# Patient Record
Sex: Female | Born: 1985 | Race: White | Hispanic: No | Marital: Married | Smoking: Current every day smoker
Health system: Southern US, Community
[De-identification: ages and names within clinical notes are randomized; demographics above are authoritative.]

## PROBLEM LIST (undated history)

## (undated) DIAGNOSIS — R109 Unspecified abdominal pain: Secondary | ICD-10-CM

## (undated) DIAGNOSIS — F32A Depression, unspecified: Secondary | ICD-10-CM

## (undated) DIAGNOSIS — F329 Major depressive disorder, single episode, unspecified: Secondary | ICD-10-CM

## (undated) DIAGNOSIS — N200 Calculus of kidney: Secondary | ICD-10-CM

## (undated) DIAGNOSIS — R569 Unspecified convulsions: Secondary | ICD-10-CM

## (undated) DIAGNOSIS — G8929 Other chronic pain: Secondary | ICD-10-CM

## (undated) DIAGNOSIS — D649 Anemia, unspecified: Secondary | ICD-10-CM

## (undated) DIAGNOSIS — F319 Bipolar disorder, unspecified: Secondary | ICD-10-CM

## (undated) DIAGNOSIS — F419 Anxiety disorder, unspecified: Secondary | ICD-10-CM

## (undated) DIAGNOSIS — F4310 Post-traumatic stress disorder, unspecified: Secondary | ICD-10-CM

## (undated) HISTORY — PX: EYE SURGERY: SHX253

## (undated) HISTORY — PX: CHOLECYSTECTOMY: SHX55

---
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
## 2008-12-03 ENCOUNTER — Ambulatory Visit: Payer: Self-pay | Admitting: Internal Medicine

## 2008-12-24 ENCOUNTER — Encounter: Payer: Self-pay | Admitting: Family Medicine

## 2008-12-24 ENCOUNTER — Ambulatory Visit: Payer: Self-pay | Admitting: Internal Medicine

## 2008-12-27 ENCOUNTER — Emergency Department (HOSPITAL_COMMUNITY): Admission: EM | Admit: 2008-12-27 | Discharge: 2008-12-27 | Payer: Self-pay | Admitting: Emergency Medicine

## 2009-01-06 ENCOUNTER — Emergency Department (HOSPITAL_COMMUNITY): Admission: EM | Admit: 2009-01-06 | Discharge: 2009-01-06 | Payer: Self-pay | Admitting: Emergency Medicine

## 2009-09-20 ENCOUNTER — Ambulatory Visit: Payer: Self-pay | Admitting: Internal Medicine

## 2009-09-29 ENCOUNTER — Emergency Department (HOSPITAL_COMMUNITY): Admission: EM | Admit: 2009-09-29 | Discharge: 2009-09-30 | Payer: Self-pay | Admitting: Emergency Medicine

## 2009-09-29 ENCOUNTER — Ambulatory Visit: Payer: Self-pay | Admitting: Internal Medicine

## 2009-10-14 ENCOUNTER — Ambulatory Visit (HOSPITAL_COMMUNITY): Admission: RE | Admit: 2009-10-14 | Discharge: 2009-10-14 | Payer: Self-pay | Admitting: Obstetrics and Gynecology

## 2009-10-21 ENCOUNTER — Emergency Department (HOSPITAL_COMMUNITY): Admission: EM | Admit: 2009-10-21 | Discharge: 2009-10-21 | Payer: Self-pay | Admitting: Emergency Medicine

## 2010-03-17 ENCOUNTER — Emergency Department (HOSPITAL_COMMUNITY): Admission: EM | Admit: 2010-03-17 | Discharge: 2010-03-18 | Payer: Self-pay | Admitting: Emergency Medicine

## 2010-03-19 ENCOUNTER — Emergency Department (HOSPITAL_COMMUNITY): Admission: EM | Admit: 2010-03-19 | Discharge: 2010-03-20 | Payer: Self-pay | Admitting: Emergency Medicine

## 2010-05-13 ENCOUNTER — Emergency Department (HOSPITAL_COMMUNITY): Admission: EM | Admit: 2010-05-13 | Discharge: 2010-05-13 | Payer: Self-pay | Admitting: Emergency Medicine

## 2010-05-15 ENCOUNTER — Emergency Department (HOSPITAL_COMMUNITY): Admission: EM | Admit: 2010-05-15 | Discharge: 2010-05-16 | Payer: Self-pay | Admitting: Emergency Medicine

## 2010-09-04 ENCOUNTER — Emergency Department (HOSPITAL_COMMUNITY)
Admission: EM | Admit: 2010-09-04 | Discharge: 2010-09-04 | Payer: Self-pay | Source: Home / Self Care | Admitting: Emergency Medicine

## 2010-09-16 ENCOUNTER — Emergency Department (HOSPITAL_COMMUNITY)
Admission: EM | Admit: 2010-09-16 | Discharge: 2010-09-16 | Payer: Self-pay | Source: Home / Self Care | Admitting: Emergency Medicine

## 2010-09-21 ENCOUNTER — Emergency Department (HOSPITAL_COMMUNITY)
Admission: EM | Admit: 2010-09-21 | Discharge: 2010-09-22 | Payer: Self-pay | Source: Home / Self Care | Admitting: Emergency Medicine

## 2010-09-25 LAB — DIFFERENTIAL
Basophils Absolute: 0 10*3/uL (ref 0.0–0.1)
Basophils Absolute: 0 10*3/uL (ref 0.0–0.1)
Basophils Relative: 0 % (ref 0–1)
Basophils Relative: 0 % (ref 0–1)
Eosinophils Absolute: 0.4 10*3/uL (ref 0.0–0.7)
Eosinophils Absolute: 0.4 10*3/uL (ref 0.0–0.7)
Eosinophils Relative: 6 % — ABNORMAL HIGH (ref 0–5)
Eosinophils Relative: 6 % — ABNORMAL HIGH (ref 0–5)
Lymphocytes Relative: 24 % (ref 12–46)
Lymphocytes Relative: 28 % (ref 12–46)
Lymphs Abs: 1.5 10*3/uL (ref 0.7–4.0)
Lymphs Abs: 1.7 10*3/uL (ref 0.7–4.0)
Monocytes Absolute: 0.5 10*3/uL (ref 0.1–1.0)
Monocytes Absolute: 0.5 10*3/uL (ref 0.1–1.0)
Monocytes Relative: 8 % (ref 3–12)
Monocytes Relative: 9 % (ref 3–12)
Neutro Abs: 3.9 10*3/uL (ref 1.7–7.7)
Neutro Abs: 3.4 10*3/uL (ref 1.7–7.7)
Neutrophils Relative %: 62 % (ref 43–77)
Neutrophils Relative %: 57 % (ref 43–77)

## 2010-09-25 LAB — URINALYSIS, ROUTINE W REFLEX MICROSCOPIC
Bilirubin Urine: NEGATIVE
Bilirubin Urine: NEGATIVE
Hgb urine dipstick: NEGATIVE
Hgb urine dipstick: NEGATIVE
Ketones, ur: NEGATIVE mg/dL
Ketones, ur: NEGATIVE mg/dL
Nitrite: NEGATIVE
Nitrite: NEGATIVE
Protein, ur: NEGATIVE mg/dL
Protein, ur: NEGATIVE mg/dL
Specific Gravity, Urine: 1.017 (ref 1.005–1.030)
Specific Gravity, Urine: 1.026 (ref 1.005–1.030)
Urine Glucose, Fasting: NEGATIVE mg/dL
Urine Glucose, Fasting: NEGATIVE mg/dL
Urobilinogen, UA: 0.2 mg/dL (ref 0.0–1.0)
Urobilinogen, UA: 0.2 mg/dL (ref 0.0–1.0)
pH: 6 (ref 5.0–8.0)
pH: 7 (ref 5.0–8.0)

## 2010-09-25 LAB — CBC
HCT: 37.2 % (ref 36.0–46.0)
HCT: 36.1 % (ref 36.0–46.0)
Hemoglobin: 12.5 g/dL (ref 12.0–15.0)
Hemoglobin: 12.6 g/dL (ref 12.0–15.0)
MCH: 30.6 pg (ref 26.0–34.0)
MCH: 31.9 pg (ref 26.0–34.0)
MCHC: 33.6 g/dL (ref 30.0–36.0)
MCHC: 34.9 g/dL (ref 30.0–36.0)
MCV: 91.2 fL (ref 78.0–100.0)
MCV: 91.4 fL (ref 78.0–100.0)
Platelets: 183 10*3/uL (ref 150–400)
Platelets: 168 10*3/uL (ref 150–400)
RBC: 4.08 MIL/uL (ref 3.87–5.11)
RBC: 3.95 MIL/uL (ref 3.87–5.11)
RDW: 13 % (ref 11.5–15.5)
RDW: 12.7 % (ref 11.5–15.5)
WBC: 6.3 10*3/uL (ref 4.0–10.5)
WBC: 6 10*3/uL (ref 4.0–10.5)

## 2010-09-25 LAB — BASIC METABOLIC PANEL
BUN: 14 mg/dL (ref 6–23)
CO2: 25 mEq/L (ref 19–32)
Calcium: 9.2 mg/dL (ref 8.4–10.5)
Chloride: 107 mEq/L (ref 96–112)
Creatinine, Ser: 0.68 mg/dL (ref 0.4–1.2)
GFR calc Af Amer: >60 mL/min (ref >60)
GFR calc non Af Amer: >60 mL/min (ref >60)
Glucose, Bld: 90 mg/dL (ref 70–99)
Potassium: 3.8 mEq/L (ref 3.5–5.1)
Sodium: 138 mEq/L (ref 135–145)

## 2010-09-25 LAB — GC/CHLAMYDIA PROBE AMP, GENITAL
Chlamydia, DNA Probe: NEGATIVE
GC Probe Amp, Genital: NEGATIVE

## 2010-09-25 LAB — COMPREHENSIVE METABOLIC PANEL
ALT: 21 U/L (ref 0–35)
AST: 26 U/L (ref 0–37)
Albumin: 3.9 g/dL (ref 3.5–5.2)
Alkaline Phosphatase: 59 U/L (ref 39–117)
BUN: 14 mg/dL (ref 6–23)
CO2: 26 mEq/L (ref 19–32)
Calcium: 9.1 mg/dL (ref 8.4–10.5)
Chloride: 105 mEq/L (ref 96–112)
Creatinine, Ser: 0.65 mg/dL (ref 0.4–1.2)
GFR calc Af Amer: >60 mL/min (ref >60)
GFR calc non Af Amer: >60 mL/min (ref >60)
Glucose, Bld: 72 mg/dL (ref 70–99)
Potassium: 3.7 mEq/L (ref 3.5–5.1)
Sodium: 136 mEq/L (ref 135–145)
Total Bilirubin: 0.5 mg/dL (ref 0.3–1.2)
Total Protein: 6.8 g/dL (ref 6.0–8.3)

## 2010-09-25 LAB — WET PREP, GENITAL
Trich, Wet Prep: NONE SEEN
WBC, Wet Prep HPF POC: NONE SEEN
Yeast Wet Prep HPF POC: NONE SEEN

## 2010-09-25 LAB — POCT PREGNANCY, URINE
Preg Test, Ur: NEGATIVE
Preg Test, Ur: NEGATIVE

## 2010-09-25 LAB — RAPID URINE DRUG SCREEN, HOSP PERFORMED
Amphetamines: NOT DETECTED
Barbiturates: NOT DETECTED
Benzodiazepines: NOT DETECTED
Cocaine: NOT DETECTED
Opiates: NOT DETECTED
Tetrahydrocannabinol: NOT DETECTED

## 2010-09-29 ENCOUNTER — Emergency Department (HOSPITAL_COMMUNITY)
Admission: EM | Admit: 2010-09-29 | Discharge: 2010-09-30 | Payer: Self-pay | Source: Home / Self Care | Admitting: Emergency Medicine

## 2010-09-30 ENCOUNTER — Emergency Department (HOSPITAL_COMMUNITY)
Admission: EM | Admit: 2010-09-30 | Discharge: 2010-10-01 | Payer: Self-pay | Source: Home / Self Care | Admitting: Emergency Medicine

## 2010-10-01 ENCOUNTER — Emergency Department (HOSPITAL_COMMUNITY)
Admission: EM | Admit: 2010-10-01 | Discharge: 2010-10-01 | Payer: Self-pay | Source: Home / Self Care | Admitting: Emergency Medicine

## 2010-10-02 LAB — COMPREHENSIVE METABOLIC PANEL
ALT: 16 U/L (ref 0–35)
AST: 25 U/L (ref 0–37)
Albumin: 4.1 g/dL (ref 3.5–5.2)
Alkaline Phosphatase: 61 U/L (ref 39–117)
BUN: 8 mg/dL (ref 6–23)
CO2: 28 mEq/L (ref 19–32)
Calcium: 9.7 mg/dL (ref 8.4–10.5)
Chloride: 105 mEq/L (ref 96–112)
Creatinine, Ser: 0.78 mg/dL (ref 0.4–1.2)
GFR calc Af Amer: >60 mL/min (ref >60)
GFR calc non Af Amer: >60 mL/min (ref >60)
Glucose, Bld: 90 mg/dL (ref 70–99)
Potassium: 3.9 mEq/L (ref 3.5–5.1)
Sodium: 140 mEq/L (ref 135–145)
Total Bilirubin: 0.5 mg/dL (ref 0.3–1.2)
Total Protein: 7.2 g/dL (ref 6.0–8.3)

## 2010-10-02 LAB — LIPASE, BLOOD: Lipase: 19 U/L (ref 11–59)

## 2010-10-02 LAB — URINALYSIS, ROUTINE W REFLEX MICROSCOPIC
Bilirubin Urine: NEGATIVE
Hgb urine dipstick: NEGATIVE
Ketones, ur: NEGATIVE mg/dL
Nitrite: NEGATIVE
Protein, ur: NEGATIVE mg/dL
Specific Gravity, Urine: 1.018 (ref 1.005–1.030)
Urine Glucose, Fasting: NEGATIVE mg/dL
Urobilinogen, UA: 0.2 mg/dL (ref 0.0–1.0)
pH: 6.5 (ref 5.0–8.0)

## 2010-10-02 LAB — CBC
HCT: 36.8 % (ref 36.0–46.0)
Hemoglobin: 12.6 g/dL (ref 12.0–15.0)
MCH: 31.2 pg (ref 26.0–34.0)
MCHC: 34.2 g/dL (ref 30.0–36.0)
MCV: 91.1 fL (ref 78.0–100.0)
Platelets: 171 10*3/uL (ref 150–400)
RBC: 4.04 MIL/uL (ref 3.87–5.11)
RDW: 12.7 % (ref 11.5–15.5)
WBC: 5.1 10*3/uL (ref 4.0–10.5)

## 2010-10-02 LAB — DIFFERENTIAL
Basophils Absolute: 0 10*3/uL (ref 0.0–0.1)
Basophils Relative: 0 % (ref 0–1)
Eosinophils Absolute: 0.4 10*3/uL (ref 0.0–0.7)
Eosinophils Relative: 8 % — ABNORMAL HIGH (ref 0–5)
Lymphocytes Relative: 34 % (ref 12–46)
Lymphs Abs: 1.7 10*3/uL (ref 0.7–4.0)
Monocytes Absolute: 0.5 10*3/uL (ref 0.1–1.0)
Monocytes Relative: 10 % (ref 3–12)
Neutro Abs: 2.5 10*3/uL (ref 1.7–7.7)
Neutrophils Relative %: 48 % (ref 43–77)

## 2010-10-02 LAB — POCT PREGNANCY, URINE: Preg Test, Ur: NEGATIVE

## 2010-10-03 LAB — CBC
MCV: 92.1 fL (ref 78.0–100.0)
Platelets: 163 10*3/uL (ref 150–400)
RBC: 3.93 MIL/uL (ref 3.87–5.11)
WBC: 5.1 10*3/uL (ref 4.0–10.5)

## 2010-10-03 LAB — COMPREHENSIVE METABOLIC PANEL
ALT: 17 U/L (ref 0–35)
AST: 25 U/L (ref 0–37)
Albumin: 3.6 g/dL (ref 3.5–5.2)
Alkaline Phosphatase: 58 U/L (ref 39–117)
BUN: 10 mg/dL (ref 6–23)
Chloride: 105 mEq/L (ref 96–112)
Potassium: 3.7 mEq/L (ref 3.5–5.1)
Sodium: 140 mEq/L (ref 135–145)
Total Bilirubin: 0.4 mg/dL (ref 0.3–1.2)

## 2010-10-03 LAB — URINALYSIS, ROUTINE W REFLEX MICROSCOPIC
Hgb urine dipstick: NEGATIVE
Ketones, ur: NEGATIVE mg/dL
Leukocytes, UA: NEGATIVE
Protein, ur: NEGATIVE mg/dL
Protein, ur: NEGATIVE mg/dL
Urine Glucose, Fasting: NEGATIVE mg/dL
Urobilinogen, UA: 0.2 mg/dL (ref 0.0–1.0)

## 2010-10-03 LAB — WET PREP, GENITAL: WBC, Wet Prep HPF POC: NONE SEEN

## 2010-10-03 LAB — DIFFERENTIAL
Eosinophils Absolute: 0.3 10*3/uL (ref 0.0–0.7)
Lymphocytes Relative: 25 % (ref 12–46)
Lymphs Abs: 1.3 10*3/uL (ref 0.7–4.0)
Neutrophils Relative %: 57 % (ref 43–77)

## 2010-10-03 LAB — POCT PREGNANCY, URINE: Preg Test, Ur: NEGATIVE

## 2010-10-03 LAB — RAPID URINE DRUG SCREEN, HOSP PERFORMED
Cocaine: NOT DETECTED
Tetrahydrocannabinol: NOT DETECTED

## 2010-10-03 LAB — GC/CHLAMYDIA PROBE AMP, GENITAL: GC Probe Amp, Genital: NEGATIVE

## 2010-10-03 LAB — URINE MICROSCOPIC-ADD ON

## 2010-11-20 LAB — COMPREHENSIVE METABOLIC PANEL
ALT: 21 U/L (ref 0–35)
Albumin: 3.8 g/dL (ref 3.5–5.2)
Alkaline Phosphatase: 62 U/L (ref 39–117)
BUN: 12 mg/dL (ref 6–23)
Chloride: 105 mEq/L (ref 96–112)
Glucose, Bld: 78 mg/dL (ref 70–99)
Potassium: 4.5 mEq/L (ref 3.5–5.1)
Sodium: 141 mEq/L (ref 135–145)
Total Bilirubin: 0.3 mg/dL (ref 0.3–1.2)

## 2010-11-20 LAB — CBC
HCT: 38 % (ref 36.0–46.0)
Hemoglobin: 12.9 g/dL (ref 12.0–15.0)
MCV: 92.2 fL (ref 78.0–100.0)
RBC: 4.12 MIL/uL (ref 3.87–5.11)
WBC: 6.9 10*3/uL (ref 4.0–10.5)

## 2010-11-20 LAB — URINALYSIS, ROUTINE W REFLEX MICROSCOPIC
Glucose, UA: NEGATIVE mg/dL
Hgb urine dipstick: NEGATIVE
Specific Gravity, Urine: 1.01 (ref 1.005–1.030)
pH: 6 (ref 5.0–8.0)

## 2010-11-20 LAB — LIPASE, BLOOD: Lipase: 27 U/L (ref 11–59)

## 2010-11-20 LAB — POCT PREGNANCY, URINE: Preg Test, Ur: NEGATIVE

## 2010-11-23 LAB — GC/CHLAMYDIA PROBE AMP, GENITAL
Chlamydia, DNA Probe: NEGATIVE
GC Probe Amp, Genital: NEGATIVE

## 2010-11-23 LAB — COMPREHENSIVE METABOLIC PANEL
ALT: 23 U/L (ref 0–35)
AST: 24 U/L (ref 0–37)
Alkaline Phosphatase: 57 U/L (ref 39–117)
Alkaline Phosphatase: 52 U/L (ref 39–117)
BUN: 11 mg/dL (ref 6–23)
CO2: 29 mEq/L (ref 19–32)
CO2: 29 mEq/L (ref 19–32)
Calcium: 9.2 mg/dL (ref 8.4–10.5)
Chloride: 108 mEq/L (ref 96–112)
Creatinine, Ser: 0.76 mg/dL (ref 0.4–1.2)
GFR calc Af Amer: >60 mL/min (ref >60)
GFR calc non Af Amer: >60 mL/min (ref >60)
GFR calc non Af Amer: >60 mL/min (ref >60)
Glucose, Bld: 83 mg/dL (ref 70–99)
Potassium: 3.7 mEq/L (ref 3.5–5.1)
Potassium: 3.8 mEq/L (ref 3.5–5.1)
Sodium: 140 mEq/L (ref 135–145)
Total Bilirubin: 0.5 mg/dL (ref 0.3–1.2)
Total Protein: 6.6 g/dL (ref 6.0–8.3)

## 2010-11-23 LAB — DIFFERENTIAL
Basophils Absolute: 0 K/uL (ref 0.0–0.1)
Basophils Absolute: 0 K/uL (ref 0.0–0.1)
Basophils Relative: 1 % (ref 0–1)
Basophils Relative: 1 % (ref 0–1)
Eosinophils Absolute: 0.2 10*3/uL (ref 0.0–0.7)
Eosinophils Absolute: 0.3 10*3/uL (ref 0.0–0.7)
Eosinophils Relative: 5 % (ref 0–5)
Eosinophils Relative: 8 % — ABNORMAL HIGH (ref 0–5)
Lymphocytes Relative: 32 % (ref 12–46)
Lymphocytes Relative: 33 % (ref 12–46)
Lymphs Abs: 1.6 10*3/uL (ref 0.7–4.0)
Lymphs Abs: 1.3 10*3/uL (ref 0.7–4.0)
Monocytes Absolute: 0.5 10*3/uL (ref 0.1–1.0)
Monocytes Absolute: 0.3 K/uL (ref 0.1–1.0)
Monocytes Relative: 9 % (ref 3–12)
Monocytes Relative: 8 % (ref 3–12)
Neutro Abs: 2.7 K/uL (ref 1.7–7.7)
Neutro Abs: 2 K/uL (ref 1.7–7.7)
Neutrophils Relative %: 54 % (ref 43–77)
Neutrophils Relative %: 50 % (ref 43–77)

## 2010-11-23 LAB — URINALYSIS, ROUTINE W REFLEX MICROSCOPIC
Bilirubin Urine: NEGATIVE
Bilirubin Urine: NEGATIVE
Glucose, UA: NEGATIVE mg/dL
Glucose, UA: NEGATIVE mg/dL
Hgb urine dipstick: NEGATIVE
Hgb urine dipstick: NEGATIVE
Ketones, ur: NEGATIVE mg/dL
Ketones, ur: NEGATIVE mg/dL
Nitrite: NEGATIVE
Nitrite: NEGATIVE
Protein, ur: NEGATIVE mg/dL
Protein, ur: NEGATIVE mg/dL
Specific Gravity, Urine: 1.023 (ref 1.005–1.030)
Specific Gravity, Urine: 1.015 (ref 1.005–1.030)
Urobilinogen, UA: 0.2 mg/dL (ref 0.0–1.0)
Urobilinogen, UA: 0.2 mg/dL (ref 0.0–1.0)
pH: 6.5 (ref 5.0–8.0)
pH: 6 (ref 5.0–8.0)

## 2010-11-23 LAB — CBC
HCT: 33.7 % — ABNORMAL LOW (ref 36.0–46.0)
HCT: 32.4 % — ABNORMAL LOW (ref 36.0–46.0)
Hemoglobin: 11.5 g/dL — ABNORMAL LOW (ref 12.0–15.0)
Hemoglobin: 11.4 g/dL — ABNORMAL LOW (ref 12.0–15.0)
MCH: 31.1 pg (ref 26.0–34.0)
MCH: 32.5 pg (ref 26.0–34.0)
MCHC: 34.1 g/dL (ref 30.0–36.0)
MCHC: 35.3 g/dL (ref 30.0–36.0)
MCV: 91.1 fL (ref 78.0–100.0)
MCV: 92.1 fL (ref 78.0–100.0)
Platelets: 171 10*3/uL (ref 150–400)
Platelets: 154 K/uL (ref 150–400)
RBC: 3.7 MIL/uL — ABNORMAL LOW (ref 3.87–5.11)
RBC: 3.52 MIL/uL — ABNORMAL LOW (ref 3.87–5.11)
RDW: 12.8 % (ref 11.5–15.5)
RDW: 13.4 % (ref 11.5–15.5)
WBC: 5 10*3/uL (ref 4.0–10.5)
WBC: 4 10*3/uL (ref 4.0–10.5)

## 2010-11-23 LAB — COMPREHENSIVE METABOLIC PANEL WITH GFR
ALT: 22 U/L (ref 0–35)
AST: 27 U/L (ref 0–37)
Albumin: 4 g/dL (ref 3.5–5.2)
Albumin: 3.9 g/dL (ref 3.5–5.2)
BUN: 13 mg/dL (ref 6–23)
Calcium: 9.6 mg/dL (ref 8.4–10.5)
Chloride: 107 meq/L (ref 96–112)
Creatinine, Ser: 0.63 mg/dL (ref 0.4–1.2)
GFR calc Af Amer: >60 mL/min (ref >60)
Glucose, Bld: 90 mg/dL (ref 70–99)
Sodium: 140 meq/L (ref 135–145)
Total Bilirubin: 0.5 mg/dL (ref 0.3–1.2)
Total Protein: 6.9 g/dL (ref 6.0–8.3)

## 2010-11-23 LAB — WET PREP, GENITAL
Clue Cells Wet Prep HPF POC: NONE SEEN
Trich, Wet Prep: NONE SEEN
WBC, Wet Prep HPF POC: NONE SEEN
Yeast Wet Prep HPF POC: NONE SEEN

## 2010-11-23 LAB — POCT PREGNANCY, URINE
Preg Test, Ur: NEGATIVE
Preg Test, Ur: NEGATIVE

## 2010-11-23 LAB — LIPASE, BLOOD
Lipase: 23 U/L (ref 11–59)
Lipase: 31 U/L (ref 11–59)

## 2010-11-26 LAB — COMPREHENSIVE METABOLIC PANEL
ALT: 34 U/L (ref 0–35)
AST: 29 U/L (ref 0–37)
Alkaline Phosphatase: 51 U/L (ref 39–117)
CO2: 27 mEq/L (ref 19–32)
Chloride: 107 mEq/L (ref 96–112)
GFR calc Af Amer: >60 mL/min (ref >60)
GFR calc non Af Amer: >60 mL/min (ref >60)
Potassium: 3.8 mEq/L (ref 3.5–5.1)
Sodium: 138 mEq/L (ref 135–145)
Total Bilirubin: 0.5 mg/dL (ref 0.3–1.2)

## 2010-11-26 LAB — POCT I-STAT, CHEM 8
Creatinine, Ser: 0.7 mg/dL (ref 0.4–1.2)
HCT: 34 % — ABNORMAL LOW (ref 36.0–46.0)
Hemoglobin: 11.6 g/dL — ABNORMAL LOW (ref 12.0–15.0)
Potassium: 4 mEq/L (ref 3.5–5.1)
Sodium: 141 mEq/L (ref 135–145)

## 2010-11-26 LAB — RAPID URINE DRUG SCREEN, HOSP PERFORMED
Amphetamines: NOT DETECTED
Tetrahydrocannabinol: NOT DETECTED

## 2010-11-26 LAB — URINALYSIS, ROUTINE W REFLEX MICROSCOPIC
Bilirubin Urine: NEGATIVE
Glucose, UA: NEGATIVE mg/dL
Hgb urine dipstick: NEGATIVE
Ketones, ur: NEGATIVE mg/dL
Nitrite: NEGATIVE
Urobilinogen, UA: 0.2 mg/dL (ref 0.0–1.0)
pH: 6 (ref 5.0–8.0)

## 2010-11-26 LAB — URINE CULTURE
Colony Count: NO GROWTH
Culture: NO GROWTH

## 2010-11-26 LAB — LIPASE, BLOOD: Lipase: 26 U/L (ref 11–59)

## 2010-11-26 LAB — CBC
Hemoglobin: 11.2 g/dL — ABNORMAL LOW (ref 12.0–15.0)
Hemoglobin: 11.3 g/dL — ABNORMAL LOW (ref 12.0–15.0)
MCH: 32.3 pg (ref 26.0–34.0)
MCHC: 33.9 g/dL (ref 30.0–36.0)
RBC: 3.46 MIL/uL — ABNORMAL LOW (ref 3.87–5.11)
RBC: 3.53 MIL/uL — ABNORMAL LOW (ref 3.87–5.11)
WBC: 5 10*3/uL (ref 4.0–10.5)

## 2010-11-26 LAB — DIFFERENTIAL
Basophils Absolute: 0 10*3/uL (ref 0.0–0.1)
Basophils Relative: 1 % (ref 0–1)
Eosinophils Absolute: 0.3 10*3/uL (ref 0.0–0.7)
Eosinophils Relative: 6 % — ABNORMAL HIGH (ref 0–5)
Lymphs Abs: 2 10*3/uL (ref 0.7–4.0)
Monocytes Relative: 7 % (ref 3–12)
Neutro Abs: 2.4 10*3/uL (ref 1.7–7.7)
Neutrophils Relative %: 48 % (ref 43–77)

## 2010-11-29 LAB — URINE CULTURE
Colony Count: NO GROWTH
Culture: NO GROWTH

## 2010-11-29 LAB — DIFFERENTIAL
Lymphocytes Relative: 32 % (ref 12–46)
Lymphs Abs: 1.8 10*3/uL (ref 0.7–4.0)
Neutrophils Relative %: 55 % (ref 43–77)

## 2010-11-29 LAB — URINALYSIS, ROUTINE W REFLEX MICROSCOPIC
Ketones, ur: NEGATIVE mg/dL
Leukocytes, UA: NEGATIVE
Nitrite: NEGATIVE
Protein, ur: NEGATIVE mg/dL

## 2010-11-29 LAB — RAPID URINE DRUG SCREEN, HOSP PERFORMED
Barbiturates: NOT DETECTED
Opiates: POSITIVE — AB
Tetrahydrocannabinol: NOT DETECTED

## 2010-11-29 LAB — CBC
HCT: 36.6 % (ref 36.0–46.0)
MCV: 92.5 fL (ref 78.0–100.0)
RBC: 3.96 MIL/uL (ref 3.87–5.11)
WBC: 5.6 10*3/uL (ref 4.0–10.5)

## 2010-11-29 LAB — POCT PREGNANCY, URINE: Preg Test, Ur: NEGATIVE

## 2010-11-29 LAB — WET PREP, GENITAL: Trich, Wet Prep: NONE SEEN

## 2010-11-29 LAB — POCT I-STAT, CHEM 8
BUN: 18 mg/dL (ref 6–23)
Calcium, Ion: 1.25 mmol/L (ref 1.12–1.32)
Chloride: 106 mEq/L (ref 96–112)
Glucose, Bld: 83 mg/dL (ref 70–99)

## 2010-11-29 LAB — GC/CHLAMYDIA PROBE AMP, GENITAL
Chlamydia, DNA Probe: NEGATIVE
GC Probe Amp, Genital: NEGATIVE

## 2010-12-20 LAB — COMPREHENSIVE METABOLIC PANEL
ALT: 16 U/L (ref 0–35)
Albumin: 3.5 g/dL (ref 3.5–5.2)
Alkaline Phosphatase: 65 U/L (ref 39–117)
BUN: 13 mg/dL (ref 6–23)
Chloride: 107 mEq/L (ref 96–112)
Potassium: 4.1 mEq/L (ref 3.5–5.1)
Sodium: 140 mEq/L (ref 135–145)
Total Bilirubin: 0.4 mg/dL (ref 0.3–1.2)
Total Protein: 6.5 g/dL (ref 6.0–8.3)

## 2010-12-20 LAB — URINALYSIS, ROUTINE W REFLEX MICROSCOPIC
Bilirubin Urine: NEGATIVE
Glucose, UA: NEGATIVE mg/dL
Glucose, UA: NEGATIVE mg/dL
Ketones, ur: NEGATIVE mg/dL
Ketones, ur: NEGATIVE mg/dL
Leukocytes, UA: NEGATIVE
Nitrite: NEGATIVE
Specific Gravity, Urine: 1.021 (ref 1.005–1.030)
Specific Gravity, Urine: 1.022 (ref 1.005–1.030)
pH: 7 (ref 5.0–8.0)
pH: 7 (ref 5.0–8.0)

## 2010-12-20 LAB — POCT I-STAT, CHEM 8
BUN: 17 mg/dL (ref 6–23)
Calcium, Ion: 1.23 mmol/L (ref 1.12–1.32)
Chloride: 105 mEq/L (ref 96–112)
Creatinine, Ser: 0.9 mg/dL (ref 0.4–1.2)
Glucose, Bld: 82 mg/dL (ref 70–99)
Potassium: 3.9 mEq/L (ref 3.5–5.1)

## 2010-12-20 LAB — WET PREP, GENITAL: Yeast Wet Prep HPF POC: NONE SEEN

## 2010-12-20 LAB — CBC
HCT: 36.4 % (ref 36.0–46.0)
Hemoglobin: 12.8 g/dL (ref 12.0–15.0)
Hemoglobin: 13.3 g/dL (ref 12.0–15.0)
MCHC: 34.7 g/dL (ref 30.0–36.0)
Platelets: 142 10*3/uL — ABNORMAL LOW (ref 150–400)
RBC: 4.17 MIL/uL (ref 3.87–5.11)
RDW: 13.4 % (ref 11.5–15.5)
WBC: 6.3 10*3/uL (ref 4.0–10.5)
WBC: 6.7 10*3/uL (ref 4.0–10.5)

## 2010-12-20 LAB — DIFFERENTIAL
Basophils Absolute: 0 10*3/uL (ref 0.0–0.1)
Basophils Relative: 0 % (ref 0–1)
Eosinophils Absolute: 0.2 10*3/uL (ref 0.0–0.7)
Eosinophils Relative: 3 % (ref 0–5)
Lymphocytes Relative: 34 % (ref 12–46)
Lymphs Abs: 2.1 10*3/uL (ref 0.7–4.0)
Monocytes Absolute: 0.4 10*3/uL (ref 0.1–1.0)
Monocytes Absolute: 0.4 10*3/uL (ref 0.1–1.0)
Monocytes Relative: 5 % (ref 3–12)
Monocytes Relative: 7 % (ref 3–12)
Neutro Abs: 3.4 10*3/uL (ref 1.7–7.7)

## 2010-12-20 LAB — GC/CHLAMYDIA PROBE AMP, GENITAL: GC Probe Amp, Genital: NEGATIVE

## 2010-12-20 LAB — RPR: RPR Ser Ql: NONREACTIVE

## 2010-12-20 LAB — URINE MICROSCOPIC-ADD ON

## 2010-12-20 LAB — POCT PREGNANCY, URINE: Preg Test, Ur: NEGATIVE

## 2012-10-05 ENCOUNTER — Emergency Department (HOSPITAL_COMMUNITY): Payer: Medicare Other

## 2012-10-05 ENCOUNTER — Emergency Department (HOSPITAL_COMMUNITY)
Admission: EM | Admit: 2012-10-05 | Discharge: 2012-10-05 | Disposition: A | Discharge status: Home / Self Care | Payer: Medicare Other | Attending: Emergency Medicine | Admitting: Emergency Medicine

## 2012-10-05 ENCOUNTER — Encounter (HOSPITAL_COMMUNITY): Payer: Self-pay | Admitting: *Deleted

## 2012-10-05 DIAGNOSIS — F17200 Nicotine dependence, unspecified, uncomplicated: Secondary | ICD-10-CM | POA: Insufficient documentation

## 2012-10-05 DIAGNOSIS — Z3202 Encounter for pregnancy test, result negative: Secondary | ICD-10-CM | POA: Insufficient documentation

## 2012-10-05 DIAGNOSIS — Z862 Personal history of diseases of the blood and blood-forming organs and certain disorders involving the immune mechanism: Secondary | ICD-10-CM | POA: Insufficient documentation

## 2012-10-05 DIAGNOSIS — R109 Unspecified abdominal pain: Secondary | ICD-10-CM

## 2012-10-05 DIAGNOSIS — Z87442 Personal history of urinary calculi: Secondary | ICD-10-CM | POA: Insufficient documentation

## 2012-10-05 DIAGNOSIS — D649 Anemia, unspecified: Secondary | ICD-10-CM | POA: Insufficient documentation

## 2012-10-05 DIAGNOSIS — R1013 Epigastric pain: Secondary | ICD-10-CM | POA: Insufficient documentation

## 2012-10-05 DIAGNOSIS — N200 Calculus of kidney: Secondary | ICD-10-CM | POA: Insufficient documentation

## 2012-10-05 DIAGNOSIS — R112 Nausea with vomiting, unspecified: Secondary | ICD-10-CM | POA: Insufficient documentation

## 2012-10-05 HISTORY — DX: Calculus of kidney: N20.0

## 2012-10-05 HISTORY — DX: Anemia, unspecified: D64.9

## 2012-10-05 LAB — URINALYSIS, ROUTINE W REFLEX MICROSCOPIC
Bilirubin Urine: NEGATIVE
Glucose, UA: NEGATIVE mg/dL
Hgb urine dipstick: NEGATIVE
Ketones, ur: NEGATIVE mg/dL
Nitrite: NEGATIVE
Specific Gravity, Urine: 1.008 (ref 1.005–1.030)
pH: 6.5 (ref 5.0–8.0)

## 2012-10-05 LAB — CBC WITH DIFFERENTIAL/PLATELET
Basophils Absolute: 0 10*3/uL (ref 0.0–0.1)
Eosinophils Absolute: 0.4 10*3/uL (ref 0.0–0.7)
Eosinophils Relative: 7 % — ABNORMAL HIGH (ref 0–5)
Lymphocytes Relative: 31 % (ref 12–46)
MCH: 31 pg (ref 26.0–34.0)
MCV: 92.8 fL (ref 78.0–100.0)
Neutrophils Relative %: 53 % (ref 43–77)
Platelets: 167 10*3/uL (ref 150–400)
RDW: 12.9 % (ref 11.5–15.5)
WBC: 6 10*3/uL (ref 4.0–10.5)

## 2012-10-05 LAB — LIPASE, BLOOD: Lipase: 36 U/L (ref 11–59)

## 2012-10-05 LAB — COMPREHENSIVE METABOLIC PANEL
Albumin: 3.7 g/dL (ref 3.5–5.2)
Alkaline Phosphatase: 62 U/L (ref 39–117)
BUN: 10 mg/dL (ref 6–23)
CO2: 28 mEq/L (ref 19–32)
Chloride: 104 mEq/L (ref 96–112)
GFR calc non Af Amer: >90 mL/min (ref >90)
Glucose, Bld: 83 mg/dL (ref 70–99)
Potassium: 3.6 mEq/L (ref 3.5–5.1)
Total Bilirubin: 0.1 mg/dL — ABNORMAL LOW (ref 0.3–1.2)

## 2012-10-05 MED ORDER — PROMETHAZINE HCL 25 MG PO TABS: 25.0000 mg | ORAL_TABLET | Freq: Four times a day (QID) | ORAL | Status: DC | PRN

## 2012-10-05 MED ORDER — PROMETHAZINE HCL 12.5 MG PO TABS
12.5000 mg | ORAL_TABLET | Freq: Once | ORAL | Status: DC
  Filled 2012-10-05: qty 1

## 2012-10-05 MED ORDER — MORPHINE SULFATE 4 MG/ML IJ SOLN
4.0000 mg | Freq: Once | INTRAMUSCULAR | Status: AC
  Administered 2012-10-05: 4 mg via INTRAVENOUS
  Filled 2012-10-05: qty 1

## 2012-10-05 MED ORDER — SODIUM CHLORIDE 0.9 % IV BOLUS (SEPSIS)
1000.0000 mL | Freq: Once | INTRAVENOUS | Status: AC
  Administered 2012-10-05: 1000 mL via INTRAVENOUS

## 2012-10-05 MED ORDER — IOHEXOL 300 MG/ML  SOLN
100.0000 mL | Freq: Once | INTRAMUSCULAR | Status: AC | PRN
  Administered 2012-10-05: 100 mL via INTRAVENOUS

## 2012-10-05 MED ORDER — PROMETHAZINE HCL 25 MG/ML IJ SOLN
12.5000 mg | Freq: Once | INTRAMUSCULAR | Status: AC
  Administered 2012-10-05: 12.5 mg via INTRAVENOUS
  Filled 2012-10-05: qty 1

## 2012-10-05 MED ORDER — IOHEXOL 300 MG/ML  SOLN
50.0000 mL | Freq: Once | INTRAMUSCULAR | Status: AC | PRN
  Administered 2012-10-05: 50 mL via ORAL

## 2012-10-05 MED ORDER — PROMETHAZINE HCL 25 MG RE SUPP: 25.0000 mg | Freq: Four times a day (QID) | RECTAL | Status: DC | PRN

## 2012-10-05 NOTE — ED Notes (Signed)
Pt unhooked from monitor for transport to CT.

## 2012-10-05 NOTE — ED Notes (Signed)
Pt sts she wants to leave has appt at 6pm, SN instructs pt on importance of waiting for results and will speak to MD. Dr. Felix Pacini to speak with pt.

## 2012-10-05 NOTE — ED Provider Notes (Signed)
History     CSN: [id number]  Arrival date & time 10/05/12  1212   First MD Initiated Contact with Patient 10/05/12 1251      No chief complaint on file.   (Consider location/radiation/quality/duration/timing/severity/associated sxs/prior treatment) HPI Complains of abdominal pain diffuse onset 5 days ago. Patient has vomited multiple times over the past 5 days, no hematemesis no fever no diarrhea. Last bowel movement 2 days ago, normal she has treated herself with Pepcid, Prilosec, ibuprofen, and Pepto-Bismol without relief no fever. Pain radiates to the back. Pain is intermittent lasting 10 minutes at a time. It was initially constant when it started 5 days ago. Presently feels nauseated the pain is mild at present. Last normal menstrual period 08/15/2013. No vaginal discharge no other associated symptoms. Nothing makes symptoms better or worse Past Medical History  Diagnosis Date  . Kidney stones   . Anemia     Past Surgical History  Procedure Date  . Cholecystectomy     No family history on file.  History  Substance Use Topics  . Smoking status: Current Every Day Smoker  . Smokeless tobacco: Not on file  . Alcohol Use: No    OB History    Grav Para Term Preterm Abortions TAB SAB Ect Mult Living                  Review of Systems  Constitutional: Negative.   HENT: Negative.   Respiratory: Negative.   Cardiovascular: Negative.   Gastrointestinal: Positive for nausea, vomiting and abdominal pain.  Musculoskeletal: Negative.   Skin: Negative.   Neurological: Negative.   Hematological: Negative.   Psychiatric/Behavioral: Negative.   All other systems reviewed and are negative.    Allergies  Reglan; Compazine; Haldol; Latex; Lidocaine; Mushroom extract complex; Orange fruit; Other; Penicillins; Toradol; Tramadol; and Zofran  Home Medications   Current Outpatient Rx  Name  Route  Sig  Dispense  Refill  . DIPHENHYDRAMINE HCL 25 MG PO TABS   Oral   Take 25  mg by mouth at bedtime as needed. For nausea         . OXYCODONE-ACETAMINOPHEN 5-325 MG PO TABS   Oral   Take 1-2 tablets by mouth every 4 (four) hours as needed. For pain         . PROMETHAZINE HCL 25 MG PO TABS   Oral   Take 25 mg by mouth every 6 (six) hours as needed. For nausea           BP 125/79  Pulse 108  Temp 98.4 F (36.9 C) (Oral)  Resp 18  SpO2 100%  LMP 09/15/2012  Physical Exam  Nursing note and vitals reviewed. Constitutional: She appears well-developed and well-nourished.  HENT:  Head: Normocephalic and atraumatic.  Eyes: Conjunctivae normal are normal. Pupils are equal, round, and reactive to light.  Neck: Neck supple. No tracheal deviation present. No thyromegaly present.  Cardiovascular: Regular rhythm.   No murmur heard.      Mildly tachycardic  Pulmonary/Chest: Effort normal and breath sounds normal.  Abdominal: Soft. Bowel sounds are normal. She exhibits no distension and no mass. There is tenderness. There is no rebound and no guarding.       Epigastric tenderness, tenderness is mild  Musculoskeletal: Normal range of motion. She exhibits no edema and no tenderness.  Neurological: She is alert. Coordination normal.  Skin: Skin is warm and dry. No rash noted.  Psychiatric: She has a normal mood and affect.  ED Course  Procedures (including critical care time)   Labs Reviewed  URINALYSIS, ROUTINE W REFLEX MICROSCOPIC   No results found. Results for orders placed during the hospital encounter of 10/05/12  URINALYSIS, ROUTINE W REFLEX MICROSCOPIC      Component Value Range   Color, Urine YELLOW  YELLOW   APPearance CLEAR  CLEAR   Specific Gravity, Urine 1.008  1.005 - 1.030   pH 6.5  5.0 - 8.0   Glucose, UA NEGATIVE  NEGATIVE mg/dL   Hgb urine dipstick NEGATIVE  NEGATIVE   Bilirubin Urine NEGATIVE  NEGATIVE   Ketones, ur NEGATIVE  NEGATIVE mg/dL   Protein, ur NEGATIVE  NEGATIVE mg/dL   Urobilinogen, UA 0.2  0.0 - 1.0 mg/dL    Nitrite NEGATIVE  NEGATIVE   Leukocytes, UA NEGATIVE  NEGATIVE  COMPREHENSIVE METABOLIC PANEL      Component Value Range   Sodium 142  135 - 145 mEq/L   Potassium 3.6  3.5 - 5.1 mEq/L   Chloride 104  96 - 112 mEq/L   CO2 28  19 - 32 mEq/L   Glucose, Bld 83  70 - 99 mg/dL   BUN 10  6 - 23 mg/dL   Creatinine, Ser 1.61  0.50 - 1.10 mg/dL   Calcium 9.6  8.4 - 09.6 mg/dL   Total Protein 6.9  6.0 - 8.3 g/dL   Albumin 3.7  3.5 - 5.2 g/dL   AST 24  0 - 37 U/L   ALT 16  0 - 35 U/L   Alkaline Phosphatase 62  39 - 117 U/L   Total Bilirubin 0.1 (*) 0.3 - 1.2 mg/dL   GFR calc non Af Amer >90  >90 mL/min   GFR calc Af Amer >90  >90 mL/min  CBC WITH DIFFERENTIAL      Component Value Range   WBC 6.0  4.0 - 10.5 K/uL   RBC 4.00  3.87 - 5.11 MIL/uL   Hemoglobin 12.4  12.0 - 15.0 g/dL   HCT 04.5  40.9 - 81.1 %   MCV 92.8  78.0 - 100.0 fL   MCH 31.0  26.0 - 34.0 pg   MCHC 33.4  30.0 - 36.0 g/dL   RDW 91.4  78.2 - 95.6 %   Platelets 167  150 - 400 K/uL   Neutrophils Relative 53  43 - 77 %   Neutro Abs 3.2  1.7 - 7.7 K/uL   Lymphocytes Relative 31  12 - 46 %   Lymphs Abs 1.9  0.7 - 4.0 K/uL   Monocytes Relative 9  3 - 12 %   Monocytes Absolute 0.5  0.1 - 1.0 K/uL   Eosinophils Relative 7 (*) 0 - 5 %   Eosinophils Absolute 0.4  0.0 - 0.7 K/uL   Basophils Relative 1  0 - 1 %   Basophils Absolute 0.0  0.0 - 0.1 K/uL  LIPASE, BLOOD      Component Value Range   Lipase 36  11 - 59 U/L  POCT PREGNANCY, URINE      Component Value Range   Preg Test, Ur NEGATIVE  NEGATIVE   Ct Abdomen Pelvis W Contrast  10/05/2012  *RADIOLOGY REPORT*  Clinical Data: Generalized abdominal pain.  Nausea and vomiting.  1- week history of these symptoms.  Surgical history includes cholecystectomy.  Prior history of urinary tract calculi.  CT ABDOMEN AND PELVIS WITH CONTRAST  Technique:  Multidetector CT imaging of the abdomen and pelvis was performed following  the standard protocol during bolus administration of  intravenous contrast.  Contrast: OMNIPAQUE IOHEXOL 300 MG/ML. Oral contrast was also administered.  Comparison: CT abdomen and pelvis 10/01/2010, 05/16/2010, 12/27/2008.  Findings: Stomach decompressed and unremarkable.  Normal-appearing small bowel.  Very large stool burden throughout the normal appearing colon.  No ascites.  Normal appearing liver, spleen, pancreas, adrenal glands, and kidneys.  Gallbladder surgically absent.  No unexpected biliary ductal dilation.  Common duct can be followed to the ampulla without obstructing stone or mass.  No visible aorto-iliofemoral atherosclerosis.  No significant lymphadenopathy.  Uterus and ovaries unremarkable for age with small bilateral ovarian cysts.  Numerous pelvic phleboliths.  Urinary bladder unremarkable.  Visualized lung bases clear.  Heart size normal.  Bone window images unremarkable.  IMPRESSION:  1.  No acute abnormalities involving the abdomen or pelvis.  Very large stool burden throughout the colon.   Original Report Authenticated By: Hulan Saas, M.D.      No diagnosis found.  2:40 PM pain and nausea improved after treatment with morphine and Phenergan. Requesting more pain medicine states pain is slightly present additional morphine ordered . 455pm feels improved after treatment with intravenous fluids and morphine and Phenergan in the emergency department. Feels ready to go home. No vomiting while here.  Plan MDM  Assessment : Patient has long-standing abdominal pain and vomiting likely functional in etiology Plan prescription Phenergan, gastroenterology referral. Referral resource guide for primary care physician Diagnosis abdominal pain       Doug Sou, MD 10/05/12 1701

## 2012-10-05 NOTE — ED Notes (Signed)
PT reports SX's started 1week ago. Pt reports she can not keep liquids or solids down. Pt also reports ABD pain for 1 week.

## 2012-10-14 ENCOUNTER — Emergency Department (HOSPITAL_COMMUNITY)
Admission: EM | Admit: 2012-10-14 | Discharge: 2012-10-14 | Disposition: A | Discharge status: Home / Self Care | Payer: Medicare Other | Attending: Emergency Medicine | Admitting: Emergency Medicine

## 2012-10-14 ENCOUNTER — Emergency Department (HOSPITAL_COMMUNITY): Payer: Medicare Other

## 2012-10-14 ENCOUNTER — Encounter (HOSPITAL_COMMUNITY): Payer: Self-pay

## 2012-10-14 DIAGNOSIS — M549 Dorsalgia, unspecified: Secondary | ICD-10-CM | POA: Insufficient documentation

## 2012-10-14 DIAGNOSIS — Z87442 Personal history of urinary calculi: Secondary | ICD-10-CM | POA: Insufficient documentation

## 2012-10-14 DIAGNOSIS — Z3202 Encounter for pregnancy test, result negative: Secondary | ICD-10-CM | POA: Insufficient documentation

## 2012-10-14 DIAGNOSIS — R109 Unspecified abdominal pain: Secondary | ICD-10-CM

## 2012-10-14 DIAGNOSIS — F17200 Nicotine dependence, unspecified, uncomplicated: Secondary | ICD-10-CM | POA: Insufficient documentation

## 2012-10-14 DIAGNOSIS — K5900 Constipation, unspecified: Secondary | ICD-10-CM

## 2012-10-14 DIAGNOSIS — Z9089 Acquired absence of other organs: Secondary | ICD-10-CM | POA: Insufficient documentation

## 2012-10-14 DIAGNOSIS — R300 Dysuria: Secondary | ICD-10-CM | POA: Insufficient documentation

## 2012-10-14 DIAGNOSIS — R112 Nausea with vomiting, unspecified: Secondary | ICD-10-CM | POA: Insufficient documentation

## 2012-10-14 DIAGNOSIS — Z862 Personal history of diseases of the blood and blood-forming organs and certain disorders involving the immune mechanism: Secondary | ICD-10-CM | POA: Insufficient documentation

## 2012-10-14 LAB — URINALYSIS, ROUTINE W REFLEX MICROSCOPIC
Bilirubin Urine: NEGATIVE
Hgb urine dipstick: NEGATIVE
Protein, ur: NEGATIVE mg/dL
Urobilinogen, UA: 0.2 mg/dL (ref 0.0–1.0)

## 2012-10-14 LAB — URINE MICROSCOPIC-ADD ON

## 2012-10-14 MED ORDER — POLYETHYLENE GLYCOL 3350 17 G PO PACK: 17.0000 g | PACK | Freq: Every day | ORAL | Status: DC

## 2012-10-14 MED ORDER — PROMETHAZINE HCL 25 MG RE SUPP: 25.0000 mg | Freq: Four times a day (QID) | RECTAL | Status: DC | PRN

## 2012-10-14 MED ORDER — OXYCODONE-ACETAMINOPHEN 5-325 MG PO TABS
1.0000 | ORAL_TABLET | Freq: Once | ORAL | Status: AC
  Administered 2012-10-14: 1 via ORAL
  Filled 2012-10-14: qty 1

## 2012-10-14 NOTE — ED Provider Notes (Signed)
History     CSN: [id number]  Arrival date & time 10/14/12  0865   First MD Initiated Contact with Patient 10/14/12 0848      Chief Complaint  Patient presents with  . Flank Pain    (Consider location/radiation/quality/duration/timing/severity/associated sxs/prior treatment) HPI Comments: 27 y/o F h/o nephrolithiasis p/w right flank pain. Onset 4 days ago. Intermittent. Radiates to groin and RUQ. Associated N/V. No hematemesis. No fevers. Mild dysuria. No hematuria. No vaginal complaints. Normal BM. Pain described as sharp/aching. Has tried home norco, motrin, and phenergan with little relief.   Patient is a 27 y.o. female presenting with flank pain.  Flank Pain This is a recurrent problem. The current episode started in the past 7 days. The problem occurs intermittently. The problem has been gradually worsening. Associated symptoms include abdominal pain (radiating from right flank), nausea and vomiting. Pertinent negatives include no change in bowel habit, chest pain, chills, congestion, coughing, fatigue, fever, headaches, numbness or rash. Exacerbated by: movement.    Past Medical History  Diagnosis Date  . Kidney stones   . Anemia     Past Surgical History  Procedure Date  . Cholecystectomy     History reviewed. No pertinent family history.  History  Substance Use Topics  . Smoking status: Current Every Day Smoker  . Smokeless tobacco: Not on file  . Alcohol Use: No    OB History    Grav Para Term Preterm Abortions TAB SAB Ect Mult Living                  Review of Systems  Constitutional: Negative for fever, chills and fatigue.  HENT: Negative for congestion and rhinorrhea.   Eyes: Negative for pain and visual disturbance.  Respiratory: Negative for cough and shortness of breath.   Cardiovascular: Negative for chest pain and leg swelling.  Gastrointestinal: Positive for nausea, vomiting and abdominal pain (radiating from right flank). Negative for diarrhea  and change in bowel habit.  Genitourinary: Positive for dysuria and flank pain. Negative for hematuria and difficulty urinating.  Musculoskeletal: Positive for back pain (right flank pain. no midline back pain. denies any numbness or tingling.).  Skin: Negative for color change and rash.  Neurological: Negative for dizziness, numbness and headaches.  All other systems reviewed and are negative.    Allergies  Reglan; Compazine; Donnatal; Haldol; Latex; Lidocaine; Mushroom extract complex; Orange fruit; Other; Penicillins; Toradol; Tramadol; and Zofran  Home Medications   Current Outpatient Rx  Name  Route  Sig  Dispense  Refill  . HYDROCODONE-ACETAMINOPHEN 5-325 MG PO TABS   Oral   Take 1 tablet by mouth every 4 (four) hours as needed. For pain         . PROMETHAZINE HCL 25 MG PO TABS   Oral   Take 25 mg by mouth every 6 (six) hours as needed. For nausea         . POLYETHYLENE GLYCOL 3350 PO PACK   Oral   Take 17 g by mouth daily.   14 each   0   . PROMETHAZINE HCL 25 MG RE SUPP   Rectal   Place 1 suppository (25 mg total) rectally every 6 (six) hours as needed for nausea.   12 each   0     BP 102/67  Pulse 95  Temp 97.5 F (36.4 C)  Resp 18  SpO2 100%  LMP 09/24/2012  Physical Exam  Nursing note and vitals reviewed. Constitutional: She is oriented to person,  place, and time. She appears well-developed and well-nourished. No distress.  HENT:  Head: Normocephalic and atraumatic.  Eyes: Conjunctivae normal are normal. Right eye exhibits no discharge. Left eye exhibits no discharge.  Neck: No tracheal deviation present.  Cardiovascular: Normal rate, regular rhythm, normal heart sounds and intact distal pulses.   Pulmonary/Chest: Effort normal and breath sounds normal. No stridor. No respiratory distress. She has no wheezes. She has no rales.  Abdominal: Soft. She exhibits no distension. There is no tenderness. There is no rigidity, no guarding, no CVA  tenderness, no tenderness at McBurney's point and negative Murphy's sign.  Musculoskeletal: She exhibits no edema and no tenderness.       Thoracic back: Normal.       Lumbar back: Normal.  Neurological: She is alert and oriented to person, place, and time.  Skin: Skin is warm and dry.    ED Course  Procedures (including critical care time)  Labs Reviewed  URINALYSIS, ROUTINE W REFLEX MICROSCOPIC - Abnormal; Notable for the following:    Leukocytes, UA TRACE (*)     All other components within normal limits  URINE MICROSCOPIC-ADD ON - Abnormal; Notable for the following:    Squamous Epithelial / LPF MANY (*)     All other components within normal limits  PREGNANCY, URINE  URINE CULTURE   Dg Abd 1 View  10/14/2012  *RADIOLOGY REPORT*  Clinical Data: Right flank pain.  History kidney stones.  ABDOMEN - 1 VIEW  Comparison: CT abdomen pelvis 10/05/2012.  Findings: No urinary tract stones are identified.  The patient has a large volume of gas and stool throughout the colon which could obscure small stones.  Note is made that no stones are identified on the prior CT.  Cholecystectomy clips are noted.  IMPRESSION:  1.  No plain film evidence of urinary tract stones. 2. Large stool burden.   Original Report Authenticated By: Holley Dexter, M.D.      1. Constipation   2. Abdominal pain       MDM    27 y/o F p/w abd pain. Similar presentation last month with negative w/u including CT scan. Benign abdominal exam. HDS, af. NAD. Non-toxic. Well appearing. Speaking in full sentences.  No hematuria. Doubt nephrolithiasis. Significant constipation on KUB c/w prior CT  Likely constipation Doubt Appendicitis, Bowel Obstruction, AAA, UTI, Pyelonephritis, Nephrolithiasis, Pancreatitis, Cholecystitis, Shingles, Perforated Bowel or Ulcer, Diverticulosis/itis, Ischemic Mesentery, Inflammatory Bowel Disease, Strangulated/Incarcerated Hernia,  PID, Intrauterine Pregnancy, Ectopic Pregnancy, Ovarian  Torsion, Tubal Ovarian Abscess, STD, or other abdominal emergency as this would be inconsistent with history and physical, low risk, other diagnosis much more likely.   Dc home, return precautions given, follow up with primary care provider, patient in agreement with plan.  Urine sent for culture. Will not treat at this time.  Phenergan at home without much relief as she says she's been having vomiting. Prescribed phenergan suppository and miralax. miralax TID x2-3 days then daily.   Labs and imaging reviewed by myself and considered in medical decision making if ordered. Imaging interpreted by radiology.   Discussed case with Dr. Radford Pax who is in agreement with assessment and plan.      Stevie Kern, MD 10/14/12 1130

## 2012-10-14 NOTE — ED Notes (Signed)
Pt with c/o right flank pain, and urinary symptoms

## 2012-10-15 ENCOUNTER — Encounter (HOSPITAL_COMMUNITY): Payer: Self-pay

## 2012-10-15 ENCOUNTER — Emergency Department (INDEPENDENT_AMBULATORY_CARE_PROVIDER_SITE_OTHER)
Admission: EM | Admit: 2012-10-15 | Discharge: 2012-10-15 | Disposition: A | Discharge status: Home / Self Care | Payer: Medicare Other | Source: Home / Self Care | Attending: Family Medicine | Admitting: Family Medicine

## 2012-10-15 DIAGNOSIS — G8929 Other chronic pain: Secondary | ICD-10-CM

## 2012-10-15 DIAGNOSIS — M549 Dorsalgia, unspecified: Secondary | ICD-10-CM

## 2012-10-15 DIAGNOSIS — K5909 Other constipation: Secondary | ICD-10-CM

## 2012-10-15 DIAGNOSIS — R109 Unspecified abdominal pain: Secondary | ICD-10-CM

## 2012-10-15 DIAGNOSIS — K5900 Constipation, unspecified: Secondary | ICD-10-CM

## 2012-10-15 MED ORDER — POLYETHYLENE GLYCOL 3350 17 G PO PACK: 17.0000 g | PACK | Freq: Two times a day (BID) | ORAL | Status: DC

## 2012-10-15 MED ORDER — ACIDOPHILUS PROBIOTIC 100 MG PO CAPS: 1.0000 | ORAL_CAPSULE | Freq: Three times a day (TID) | ORAL | Status: DC

## 2012-10-15 NOTE — ED Provider Notes (Signed)
History    CSN: [id number]  Arrival date & time 10/15/12  1506   First MD Initiated Contact with Patient 10/15/12 1518     Chief Complaint  Patient presents with  . Back Pain   The history is provided by the patient.   Pt presenting today for follow up from recent ER visit where she was having abdominal pain.  Pt was diagnosed with severe constipation.  Pt has a large stool burden.  Pt is complaining of memory problems.   Pt says that she is taking the miralax since yesterday.  Pt says that she has chronic back pain.  Pt says that she has tried physical therapy in the past.  Pt says that she was never able to follow up with a back specialist.  Pt would like to have a referral for back specialist for evaluation.  The patient reports that she's having a very small bowel movements where only a small amount of stool comes out at one time.    Past Medical History  Diagnosis Date  . Kidney stones   . Anemia     Past Surgical History  Procedure Date  . Cholecystectomy     No family history on file.  History  Substance Use Topics  . Smoking status: Current Every Day Smoker  . Smokeless tobacco: Not on file  . Alcohol Use: No    OB History    Grav Para Term Preterm Abortions TAB SAB Ect Mult Living                 Review of Systems  Constitutional: Positive for fatigue.  Gastrointestinal: Positive for nausea, abdominal pain, constipation and abdominal distention.  Musculoskeletal: Positive for back pain.  All other systems reviewed and are negative.    Allergies  Reglan; Compazine; Donnatal; Haldol; Latex; Lidocaine; Mushroom extract complex; Orange fruit; Other; Penicillins; Toradol; Tramadol; and Zofran  Home Medications   Current Outpatient Rx  Name  Route  Sig  Dispense  Refill  . HYDROCODONE-ACETAMINOPHEN 5-325 MG PO TABS   Oral   Take 1 tablet by mouth every 4 (four) hours as needed. For pain         . POLYETHYLENE GLYCOL 3350 PO PACK   Oral   Take 17 g by  mouth daily.   14 each   0   . PROMETHAZINE HCL 25 MG RE SUPP   Rectal   Place 1 suppository (25 mg total) rectally every 6 (six) hours as needed for nausea.   12 each   0   . PROMETHAZINE HCL 25 MG PO TABS   Oral   Take 25 mg by mouth every 6 (six) hours as needed. For nausea           BP 99/72  Pulse 70  Temp 98.8 F (37.1 C) (Oral)  Resp 16  SpO2 100%  LMP 09/24/2012  Physical Exam  Nursing note and vitals reviewed. Constitutional: She is oriented to person, place, and time. She appears well-developed and well-nourished.  HENT:  Head: Normocephalic and atraumatic.  Nose: Nose normal.  Eyes: Conjunctivae normal and EOM are normal. Pupils are equal, round, and reactive to light.  Neck: Normal range of motion. Neck supple.  Cardiovascular: Normal rate, regular rhythm and normal heart sounds.   Pulmonary/Chest: Effort normal and breath sounds normal.  Abdominal: Soft. Bowel sounds are normal. She exhibits distension. She exhibits no mass. There is no tenderness. There is no rebound and no guarding.  Musculoskeletal: Normal  range of motion.  Neurological: She is alert and oriented to person, place, and time. She has normal reflexes.  Skin: Skin is warm and dry.  Psychiatric: She has a normal mood and affect. Her behavior is normal.    ED Course  Procedures (including critical care time)  Labs Reviewed - No data to display Dg Abd 1 View  10/14/2012  *RADIOLOGY REPORT*  Clinical Data: Right flank pain.  History kidney stones.  ABDOMEN - 1 VIEW  Comparison: CT abdomen pelvis 10/05/2012.  Findings: No urinary tract stones are identified.  The patient has a large volume of gas and stool throughout the colon which could obscure small stones.  Note is made that no stones are identified on the prior CT.  Cholecystectomy clips are noted.  IMPRESSION:  1.  No plain film evidence of urinary tract stones. 2. Large stool burden.   Original Report Authenticated By: Holley Dexter,  M.D.     No diagnosis found.  MDM  IMPRESSION  Chronic constipation  Chronic low back pain  History of thyroid disease   RECOMMENDATIONS / PLAN Increase your miralax to twice per day, increase fiber in diet RTC in 10 days to have TSH and Free T4 on return visit Referral to back specialist per patient request  FOLLOW UP 10 days   The patient was given clear instructions to go to ER or return to medical center if symptoms don't improve, worsen or new problems develop.  The patient verbalized understanding.  The patient was told to call to get lab results if they haven't heard anything in the next week.            Cleora Fleet, MD 10/15/12 2019

## 2012-10-15 NOTE — ED Notes (Signed)
Patient states has had chronic back pain in the middle and lower back. Concerned about her weight and wants to quit smoking

## 2012-10-16 LAB — URINE CULTURE: Colony Count: >=100000

## 2012-10-16 NOTE — ED Provider Notes (Addendum)
I evaluated the patient, reviewed the resident's note and I agree with the findings and plan.   .Face to face Exam:  General:  Awake HEENT:  Atraumatic Resp:  Normal effort Abd:  Nondistended Neuro:No focal weakness     Nelia Shi, MD 10/16/12 1314  Nelia Shi, MD 11/28/12 1045

## 2012-10-18 ENCOUNTER — Emergency Department (HOSPITAL_COMMUNITY): Payer: Medicare Other

## 2012-10-18 ENCOUNTER — Emergency Department (HOSPITAL_COMMUNITY)
Admission: EM | Admit: 2012-10-18 | Discharge: 2012-10-18 | Disposition: A | Discharge status: Home / Self Care | Payer: Medicare Other | Attending: Emergency Medicine | Admitting: Emergency Medicine

## 2012-10-18 ENCOUNTER — Encounter (HOSPITAL_COMMUNITY): Payer: Self-pay | Admitting: Emergency Medicine

## 2012-10-18 DIAGNOSIS — K5900 Constipation, unspecified: Secondary | ICD-10-CM | POA: Insufficient documentation

## 2012-10-18 DIAGNOSIS — Z862 Personal history of diseases of the blood and blood-forming organs and certain disorders involving the immune mechanism: Secondary | ICD-10-CM | POA: Insufficient documentation

## 2012-10-18 DIAGNOSIS — Z3202 Encounter for pregnancy test, result negative: Secondary | ICD-10-CM | POA: Insufficient documentation

## 2012-10-18 DIAGNOSIS — R109 Unspecified abdominal pain: Secondary | ICD-10-CM | POA: Insufficient documentation

## 2012-10-18 DIAGNOSIS — K5909 Other constipation: Secondary | ICD-10-CM

## 2012-10-18 DIAGNOSIS — Z87442 Personal history of urinary calculi: Secondary | ICD-10-CM | POA: Insufficient documentation

## 2012-10-18 DIAGNOSIS — R112 Nausea with vomiting, unspecified: Secondary | ICD-10-CM | POA: Insufficient documentation

## 2012-10-18 DIAGNOSIS — F17200 Nicotine dependence, unspecified, uncomplicated: Secondary | ICD-10-CM | POA: Insufficient documentation

## 2012-10-18 LAB — POCT I-STAT, CHEM 8
Hemoglobin: 11.9 g/dL — ABNORMAL LOW (ref 12.0–15.0)
Sodium: 140 mEq/L (ref 135–145)
TCO2: 27 mmol/L (ref 0–100)

## 2012-10-18 LAB — POCT PREGNANCY, URINE: Preg Test, Ur: NEGATIVE

## 2012-10-18 MED ORDER — OXYMETAZOLINE HCL 0.05 % NA SOLN: 2.0000 | Freq: Two times a day (BID) | NASAL | Status: DC

## 2012-10-18 MED ORDER — ACETAMINOPHEN 325 MG PO TABS
650.0000 mg | ORAL_TABLET | Freq: Once | ORAL | Status: AC
  Administered 2012-10-18: 650 mg via ORAL
  Filled 2012-10-18: qty 2

## 2012-10-18 MED ORDER — FLEET ENEMA 7-19 GM/118ML RE ENEM
1.0000 | ENEMA | Freq: Once | RECTAL | Status: AC
  Administered 2012-10-18: 1 via RECTAL
  Filled 2012-10-18: qty 1

## 2012-10-18 NOTE — ED Provider Notes (Signed)
Medical screening examination/treatment/procedure(s) were performed by non-physician practitioner and as supervising physician I was immediately available for consultation/collaboration.   Charlaine Utsey, MD 10/18/12 2353 

## 2012-10-18 NOTE — ED Notes (Addendum)
Pt c/o generalized abdominal pain with n/v along with constipation. Pt symptoms x 1- 1/2 weeks. Pt seen here for same in past and also at urgent care center with no relief. Last BM 2 weeks ago.

## 2012-10-18 NOTE — ED Provider Notes (Signed)
History     CSN: [id number]  Arrival date & time 10/18/12  1302   First MD Initiated Contact with Patient 10/18/12 1558      Chief Complaint  Patient presents with  . Constipation  . Abdominal Pain  . Nausea  . Emesis    (Consider location/radiation/quality/duration/timing/severity/associated sxs/prior treatment) HPI  Patient presents to the emergency department for constipation, abdominal pain, vomiting. She has had multiple visits to the urgent care the emergency department for the same thing over the past week. She has tried enema, MiraLAX, stool softener none of which have relieved her constipation. She denies being on any medications that exacerbate constipation. Denies having this problem in the past. She denies having any fevers, chills, weakness, severe abdominal pain or passing any stool over the past week.  Past Medical History  Diagnosis Date  . Kidney stones   . Anemia     Past Surgical History  Procedure Laterality Date  . Cholecystectomy      No family history on file.  History  Substance Use Topics  . Smoking status: Current Every Day Smoker  . Smokeless tobacco: Not on file  . Alcohol Use: No    OB History   Grav Para Term Preterm Abortions TAB SAB Ect Mult Living                  Review of Systems  Review of Systems  Gen: no weight loss, fevers, chills, night sweats  Eyes: no discharge or drainage, no occular pain or visual changes  Nose: no epistaxis or rhinorrhea  Mouth: no dental pain, no sore throat  Neck: no neck pain  Lungs:No wheezing, coughing or hemoptysis CV: no chest pain, palpitations, dependent edema or orthopnea  Abd: no , nausea, vomiting, + constipation and abdominal pain GU: no dysuria or gross hematuria  MSK:  No abnormalities  Neuro: no headache, no focal neurologic deficits  Skin: no abnormalities Psyche: negative.   Allergies  Reglan; Compazine; Donnatal; Haldol; Latex; Lidocaine; Mushroom extract complex; Orange  fruit; Other; Penicillins; Toradol; Tramadol; and Zofran  Home Medications   Current Outpatient Rx  Name  Route  Sig  Dispense  Refill  . polyethylene glycol (MIRALAX / GLYCOLAX) packet   Oral   Take 17 g by mouth 2 (two) times daily.   100 each   2     BP 103/63  Pulse 63  Temp(Src) 98.3 F (36.8 C) (Oral)  Resp 20  SpO2 97%  LMP 09/24/2012  Physical Exam  Nursing note and vitals reviewed. Constitutional: She appears well-developed and well-nourished. No distress.  HENT:  Head: Normocephalic and atraumatic.  Eyes: Pupils are equal, round, and reactive to light.  Neck: Normal range of motion. Neck supple.  Cardiovascular: Normal rate and regular rhythm.   Pulmonary/Chest: Effort normal.  Abdominal: Soft. She exhibits distension (mild ). She exhibits no mass. There is tenderness (mild diffuse tenderness to palpation). There is no rebound and no guarding.  No peritoneal signs  Neurological: She is alert.  Skin: Skin is warm and dry.    ED Course  Procedures (including critical care time)  Labs Reviewed  POCT PREGNANCY, URINE   Dg Abd 2 Views  10/18/2012  *RADIOLOGY REPORT*  Clinical Data: Abdominal pain.  Constipation  ABDOMEN - 2 VIEW  Comparison: 10/14/2012  Findings: Increased stool in the colon compared with the prior study.  Negative for bowel obstruction.  No free air.  Surgical clips in the gallbladder fossa.  No kidney  stones.  IMPRESSION: Worsening constipation without bowel obstruction.   Original Report Authenticated By: Janeece Riggers, M.D.      No diagnosis found. Dx: chronic constipation   MDM  Pts constipation is worsening. She refuses digital rectal exam to loosen stool even though i told her benefits of doing so may stimulate rectum. I asked multiple times. Will put bedisde commode in room and given enema in ED. If this fails, will Rx Golytely. She has not followed up with GI. No bowel obstruction on plan films.   6:25pm- pt having bowel movement  after enema in room. Has a lot of relief.  Will Rx another enema in case she needs it and she is to continue using stool softners, miralax and drinking plenty of water.   Pt has been advised of the symptoms that warrant their return to the ED. Patient has voiced understanding and has agreed to follow-up with the PCP or specialist.       Dorthula Matas, PA 10/18/12 4540

## 2012-10-18 NOTE — ED Notes (Signed)
Bowel sounds active in all quadrants. States she is not passing gas. C/o nausea and vomiting.

## 2012-10-21 NOTE — ED Notes (Signed)
Pt has an appt @ [facility] 10/28/12 @ 2:30 pm with dr. Ethelene Hal

## 2012-10-27 ENCOUNTER — Encounter (HOSPITAL_COMMUNITY): Payer: Self-pay

## 2012-10-27 ENCOUNTER — Emergency Department (INDEPENDENT_AMBULATORY_CARE_PROVIDER_SITE_OTHER)
Admission: EM | Admit: 2012-10-27 | Discharge: 2012-10-27 | Disposition: A | Discharge status: Home / Self Care | Payer: Medicare Other | Source: Home / Self Care | Attending: Family Medicine | Admitting: Family Medicine

## 2012-10-27 DIAGNOSIS — K5900 Constipation, unspecified: Secondary | ICD-10-CM

## 2012-10-27 DIAGNOSIS — D649 Anemia, unspecified: Secondary | ICD-10-CM

## 2012-10-27 DIAGNOSIS — K5909 Other constipation: Secondary | ICD-10-CM

## 2012-10-27 DIAGNOSIS — G8929 Other chronic pain: Secondary | ICD-10-CM

## 2012-10-27 DIAGNOSIS — N951 Menopausal and female climacteric states: Secondary | ICD-10-CM

## 2012-10-27 DIAGNOSIS — M549 Dorsalgia, unspecified: Secondary | ICD-10-CM

## 2012-10-27 DIAGNOSIS — R232 Flushing: Secondary | ICD-10-CM

## 2012-10-27 LAB — CBC
HCT: 41.9 % (ref 36.0–46.0)
MCHC: 33.9 g/dL (ref 30.0–36.0)
RDW: 13 % (ref 11.5–15.5)

## 2012-10-27 LAB — TSH: TSH: 4.023 u[IU]/mL (ref 0.350–4.500)

## 2012-10-27 LAB — T4, FREE: Free T4: 1.01 ng/dL (ref 0.80–1.80)

## 2012-10-27 NOTE — ED Provider Notes (Signed)
History     CSN: [id number]  Arrival date & time 10/27/12  1508   First MD Initiated Contact with Patient 10/27/12 1524     Chief Complaint  Patient presents with  . Follow-up  . Hot Flashes   HPI Pt has been to ER recently for constipation and required an enema.  Pt says that she sometimes has difficulty with bowel movements but reports that for the past several days she has had regular bowel movements.  She reports that she's had 2 bowel movements today.  She reports that she has been taking narcotic pain medications given to her by a provider for dental pain.  She would like a referral to a dental care specialist.  Past Medical History  Diagnosis Date  . Kidney stones   . Anemia     Past Surgical History  Procedure Laterality Date  . Cholecystectomy      No family history on file.  History  Substance Use Topics  . Smoking status: Current Every Day Smoker  . Smokeless tobacco: Not on file  . Alcohol Use: No    OB History   Grav Para Term Preterm Abortions TAB SAB Ect Mult Living                 Review of Systems  HENT: Positive for dental problem.   Musculoskeletal: Positive for back pain and arthralgias.  All other systems reviewed and are negative.    Allergies  Reglan; Compazine; Donnatal; Haldol; Latex; Lidocaine; Mushroom extract complex; Orange fruit; Other; Penicillins; Toradol; Tramadol; and Zofran  Home Medications   Current Outpatient Rx  Name  Route  Sig  Dispense  Refill  . oxymetazoline (AFRIN NASAL SPRAY) 0.05 % nasal spray   Nasal   Place 2 sprays into the nose 2 (two) times daily.   30 mL   0   . polyethylene glycol (MIRALAX / GLYCOLAX) packet   Oral   Take 17 g by mouth 2 (two) times daily.   100 each   2     BP 122/77  Pulse 68  Temp(Src) 98.1 F (36.7 C) (Oral)  Ht 5\' 3"  (1.6 m)  Wt 144 lb (65.318 kg)  BMI 25.51 kg/m2  SpO2 99%  LMP 09/24/2012  Physical Exam  Nursing note and vitals reviewed. Constitutional: She is  oriented to person, place, and time. She appears well-developed and well-nourished. No distress.  HENT:  Head: Normocephalic and atraumatic.  Mouth/Throat: Mucous membranes are normal. Abnormal dentition. Dental caries present.  Eyes: EOM are normal. Pupils are equal, round, and reactive to light.  Neck: Normal range of motion. Neck supple.  Cardiovascular: Normal rate, regular rhythm and normal heart sounds.   Pulmonary/Chest: Effort normal and breath sounds normal. No respiratory distress. She has no wheezes. She has no rales. She exhibits no tenderness.  Abdominal: Soft. Bowel sounds are normal. She exhibits no distension and no mass. There is no tenderness. There is no rebound and no guarding.  Musculoskeletal: Normal range of motion. She exhibits no edema and no tenderness.  Neurological: She is alert and oriented to person, place, and time.  Skin: Skin is warm and dry.  Psychiatric: She has a normal mood and affect. Her behavior is normal. Judgment and thought content normal.    ED Course  Procedures (including critical care time)  Labs Reviewed - No data to display No results found.   No diagnosis found.  MDM  IMPRESSION  Chronic Constipation  Hot Flushes  Chronic  lower back pain and muscle spasms   Anemia   Dental problem: Caries and possible abscess  RECOMMENDATIONS / PLAN Trial of OTC colace 100 mg po daily (pt says that she cannot afford to get miralax over the counter).  Check TSH and free T4 today (pt reports a history of thyroid disease) Check CBC High fiber diet recommended  The patient was advised to see a dentist as soon as possible, the patient was given written information for several facilities that she could call that except her insurance and also except sliding scale fee alternatives for patients.  The patient verbalized understanding and reported that she would call immediately to set up an appointment.  I also told her that the narcotics that she's  taking contributed to her chronic constipation.  The patient verbalized understanding.  I did not give her another prescription for narcotic as she had been requesting.  FOLLOW UP 2 months   The patient was given clear instructions to go to ER or return to medical center if symptoms don't improve, worsen or new problems develop.  The patient verbalized understanding.  The patient was told to call to get lab results if they haven't heard anything in the next week.            Cleora Fleet, MD 10/27/12 2031

## 2012-10-27 NOTE — ED Notes (Signed)
Follow up from constipations issues Has been experiencing hot flashes lately

## 2012-10-28 ENCOUNTER — Telehealth (HOSPITAL_COMMUNITY): Payer: Self-pay

## 2012-10-28 NOTE — Progress Notes (Signed)
Quick Note:  Please notify patient that her labs came back normal.   Rodney Langton, MD, CDE, FAAFP [facility] [place], [place]   ______

## 2012-10-28 NOTE — Telephone Encounter (Signed)
Message copied by Lestine Mount on Tue Oct 28, 2012  4:23 PM ------      Message from: Cleora Fleet      Created: Tue Oct 28, 2012 10:40 AM       Please notify patient that her labs came back normal.              Rodney Langton, MD, CDE, FAAFP      [facility]      [facility]      [place], [place]        ------

## 2012-10-29 NOTE — ED Notes (Signed)
Referral faxed to [facility] appt. 2/18 @ 2:30

## 2012-11-12 ENCOUNTER — Encounter: Payer: Self-pay | Admitting: Family Medicine

## 2012-11-12 DIAGNOSIS — M545 Low back pain, unspecified: Secondary | ICD-10-CM | POA: Insufficient documentation

## 2012-11-12 DIAGNOSIS — G8929 Other chronic pain: Secondary | ICD-10-CM | POA: Insufficient documentation

## 2012-11-12 DIAGNOSIS — R232 Flushing: Secondary | ICD-10-CM | POA: Insufficient documentation

## 2012-11-12 DIAGNOSIS — K5909 Other constipation: Secondary | ICD-10-CM | POA: Insufficient documentation

## 2012-11-17 ENCOUNTER — Emergency Department (HOSPITAL_COMMUNITY)
Admission: EM | Admit: 2012-11-17 | Discharge: 2012-11-17 | Disposition: A | Discharge status: Home / Self Care | Payer: Medicare Other | Attending: Emergency Medicine | Admitting: Emergency Medicine

## 2012-11-17 ENCOUNTER — Encounter (HOSPITAL_COMMUNITY): Payer: Self-pay | Admitting: Emergency Medicine

## 2012-11-17 DIAGNOSIS — Z87442 Personal history of urinary calculi: Secondary | ICD-10-CM | POA: Insufficient documentation

## 2012-11-17 DIAGNOSIS — R110 Nausea: Secondary | ICD-10-CM

## 2012-11-17 DIAGNOSIS — R112 Nausea with vomiting, unspecified: Secondary | ICD-10-CM | POA: Insufficient documentation

## 2012-11-17 DIAGNOSIS — Z862 Personal history of diseases of the blood and blood-forming organs and certain disorders involving the immune mechanism: Secondary | ICD-10-CM | POA: Insufficient documentation

## 2012-11-17 DIAGNOSIS — Z3202 Encounter for pregnancy test, result negative: Secondary | ICD-10-CM | POA: Insufficient documentation

## 2012-11-17 DIAGNOSIS — R109 Unspecified abdominal pain: Secondary | ICD-10-CM

## 2012-11-17 DIAGNOSIS — F17200 Nicotine dependence, unspecified, uncomplicated: Secondary | ICD-10-CM | POA: Insufficient documentation

## 2012-11-17 LAB — CBC WITH DIFFERENTIAL/PLATELET
Basophils Absolute: 0 10*3/uL (ref 0.0–0.1)
Basophils Relative: 1 % (ref 0–1)
Eosinophils Absolute: 0.4 10*3/uL (ref 0.0–0.7)
Eosinophils Relative: 7 % — ABNORMAL HIGH (ref 0–5)
HCT: 39 % (ref 36.0–46.0)
Hemoglobin: 13.4 g/dL (ref 12.0–15.0)
Lymphocytes Relative: 29 % (ref 12–46)
Lymphs Abs: 1.9 K/uL (ref 0.7–4.0)
MCH: 31.5 pg (ref 26.0–34.0)
MCHC: 34.4 g/dL (ref 30.0–36.0)
MCV: 91.5 fL (ref 78.0–100.0)
Monocytes Absolute: 0.4 K/uL (ref 0.1–1.0)
Monocytes Relative: 6 % (ref 3–12)
Neutro Abs: 3.8 K/uL (ref 1.7–7.7)
Neutrophils Relative %: 58 % (ref 43–77)
Platelets: 183 10*3/uL (ref 150–400)
RBC: 4.26 MIL/uL (ref 3.87–5.11)
RDW: 13 % (ref 11.5–15.5)
WBC: 6.6 10*3/uL (ref 4.0–10.5)

## 2012-11-17 LAB — COMPREHENSIVE METABOLIC PANEL
ALT: 17 U/L (ref 0–35)
AST: 22 U/L (ref 0–37)
Albumin: 3.8 g/dL (ref 3.5–5.2)
CO2: 29 mEq/L (ref 19–32)
Calcium: 9.8 mg/dL (ref 8.4–10.5)
Sodium: 141 mEq/L (ref 135–145)
Total Protein: 7.3 g/dL (ref 6.0–8.3)

## 2012-11-17 LAB — URINALYSIS, ROUTINE W REFLEX MICROSCOPIC
Bilirubin Urine: NEGATIVE
Glucose, UA: NEGATIVE mg/dL
Hgb urine dipstick: NEGATIVE
Ketones, ur: NEGATIVE mg/dL
Leukocytes, UA: NEGATIVE
Nitrite: NEGATIVE
Protein, ur: NEGATIVE mg/dL
Specific Gravity, Urine: 1.007 (ref 1.005–1.030)
Urobilinogen, UA: 0.2 mg/dL (ref 0.0–1.0)
pH: 7.5 (ref 5.0–8.0)

## 2012-11-17 LAB — COMPREHENSIVE METABOLIC PANEL WITH GFR
Alkaline Phosphatase: 66 U/L (ref 39–117)
BUN: 13 mg/dL (ref 6–23)
Chloride: 105 meq/L (ref 96–112)
Creatinine, Ser: 0.72 mg/dL (ref 0.50–1.10)
GFR calc Af Amer: >90 mL/min (ref >90)
GFR calc non Af Amer: >90 mL/min (ref >90)
Glucose, Bld: 88 mg/dL (ref 70–99)
Potassium: 4.1 meq/L (ref 3.5–5.1)
Total Bilirubin: 0.3 mg/dL (ref 0.3–1.2)

## 2012-11-17 LAB — POCT PREGNANCY, URINE: Preg Test, Ur: NEGATIVE

## 2012-11-17 LAB — LIPASE, BLOOD: Lipase: 29 U/L (ref 11–59)

## 2012-11-17 MED ORDER — HYDROMORPHONE HCL PF 1 MG/ML IJ SOLN
1.0000 mg | Freq: Once | INTRAMUSCULAR | Status: AC
  Administered 2012-11-17: 1 mg via INTRAMUSCULAR
  Filled 2012-11-17: qty 1

## 2012-11-17 MED ORDER — PROMETHAZINE HCL 25 MG/ML IJ SOLN
25.0000 mg | Freq: Once | INTRAMUSCULAR | Status: AC
  Administered 2012-11-17: 25 mg via INTRAMUSCULAR
  Filled 2012-11-17: qty 1

## 2012-11-17 NOTE — Discharge Instructions (Signed)
Abdominal Pain  Abdominal pain can be caused by many things. Your caregiver decides the seriousness of your pain by an examination and possibly blood tests and X-rays. Many cases can be observed and treated at home. Most abdominal pain is not caused by a disease and will probably improve without treatment. However, in many cases, more time must pass before a clear cause of the pain can be found. Before that point, it may not be known if you need more testing, or if hospitalization or surgery is needed.  HOME CARE INSTRUCTIONS    Do not take laxatives unless directed by your caregiver.   Take pain medicine only as directed by your caregiver.   Only take over-the-counter or prescription medicines for pain, discomfort, or fever as directed by your caregiver.   Try a clear liquid diet (broth, tea, or water) for as long as directed by your caregiver. Slowly move to a bland diet as tolerated.  SEEK IMMEDIATE MEDICAL CARE IF:    The pain does not go away.   You have a fever.   You keep throwing up (vomiting).   The pain is felt only in portions of the abdomen. Pain in the right side could possibly be appendicitis. In an adult, pain in the left lower portion of the abdomen could be colitis or diverticulitis.   You pass bloody or black tarry stools.  MAKE SURE YOU:    Understand these instructions.   Will watch your condition.   Will get help right away if you are not doing well or get worse.  Document Released: 06/06/2005 Document Revised: 11/19/2011 Document Reviewed: 04/14/2008  [facility] Patient Information 2013 [place].    Nausea, Adult  Nausea is the feeling that you have an upset stomach or have to vomit. Nausea by itself is not likely a serious concern, but it may be an early sign of more serious medical problems. As nausea gets worse, it can lead to vomiting. If vomiting develops, there is the risk of dehydration.   CAUSES    Viral infections.   Food  poisoning.   Medicines.   Pregnancy.   Motion sickness.   Migraine headaches.   Emotional distress.   Severe pain from any source.   Alcohol intoxication.  HOME CARE INSTRUCTIONS   Get plenty of rest.   Ask your caregiver about specific rehydration instructions.   Eat small amounts of food and sip liquids more often.   Take all medicines as told by your caregiver.  SEEK MEDICAL CARE IF:   You have not improved after 2 days, or you get worse.   You have a headache.  SEEK IMMEDIATE MEDICAL CARE IF:    You have a fever.   You faint.   You keep vomiting or have blood in your vomit.   You are extremely weak or dehydrated.   You have dark or bloody stools.   You have severe chest or abdominal pain.  MAKE SURE YOU:   Understand these instructions.   Will watch your condition.   Will get help right away if you are not doing well or get worse.  Document Released: 10/04/2004 Document Revised: 11/19/2011 Document Reviewed: 05/09/2011  [facility] Patient Information 2013 [place].

## 2012-11-17 NOTE — ED Notes (Signed)
Pt c/o abd pain with some vomiting and burning with urination x several days

## 2012-11-17 NOTE — ED Provider Notes (Signed)
History     CSN: [id number]  Arrival date & time 11/17/12  7829   First MD Initiated Contact with Patient 11/17/12 1027      Chief Complaint  Patient presents with  . Abdominal Pain    (Consider location/radiation/quality/duration/timing/severity/associated sxs/prior treatment) HPI Comments: Patient with prior history of abdominal pain associated nausea and vomiting presents with the same that began approximately 6 days ago. She reports she has intermittent sharp pain on the right side that lasts for about 15 minutes and resolved completely before coming back about 30 minutes later. She reports the pain began she started taking over-the-counter medications like Tylenol and ibuprofen with no significant relief. Subsequently for the last few days, she has had nausea and vomiting and this occurs any time she tries to eat or drink anything. She lists numerous drug allergies which all seem to be intolerances including Reglan, Compazine, Donnatal, Zofran, Toradol, tramadol. It seems her only medications that she tolerates are frank narcotics and Phenergan. Patient applies that she is given Vicodin often for "pain." She reports that she does have tablets of Phenergan and suppositories of Phenergan at home and she has been trying both with no relief of her nausea and vomiting. She endorses some burning with urination but not frequency. She denies any other GYN symptoms. She denies any chest pain, shortness of breath. She denies any blood in her stool. She denies bilious or bloody emesis. She denies fever or chills. She has Medicare but denies having a primary care physician.  Patient is a 27 y.o. female presenting with abdominal pain. The history is provided by the patient.  Abdominal Pain Associated symptoms: nausea and vomiting   Associated symptoms: no chest pain, no chills, no constipation, no diarrhea, no fever and no shortness of breath     Past Medical History  Diagnosis Date  . Kidney stones    . Anemia     Past Surgical History  Procedure Laterality Date  . Cholecystectomy      History reviewed. No pertinent family history.  History  Substance Use Topics  . Smoking status: Current Every Day Smoker  . Smokeless tobacco: Not on file  . Alcohol Use: No    OB History   Grav Para Term Preterm Abortions TAB SAB Ect Mult Living                  Review of Systems  Constitutional: Negative for fever, chills, activity change and appetite change.  Respiratory: Negative for shortness of breath.   Cardiovascular: Negative for chest pain.  Gastrointestinal: Positive for nausea, vomiting and abdominal pain. Negative for diarrhea, constipation and blood in stool.  All other systems reviewed and are negative.    Allergies  Reglan; Compazine; Donnatal; Haldol; Latex; Lidocaine; Mushroom extract complex; Orange fruit; Other; Penicillins; Toradol; Tramadol; and Zofran  Home Medications   Current Outpatient Rx  Name  Route  Sig  Dispense  Refill  . diphenhydrAMINE (BENADRYL) 25 MG tablet   Oral   Take 25 mg by mouth every 6 (six) hours as needed for itching or allergies.         Marland Kitchen HYDROcodone-acetaminophen (VICODIN) 5-500 MG per tablet   Oral   Take 1 tablet by mouth every 6 (six) hours as needed for pain.         . promethazine (PHENERGAN) 25 MG tablet   Oral   Take 25 mg by mouth every 6 (six) hours as needed for nausea.  BP 116/72  Pulse 82  Temp(Src) 97.9 F (36.6 C) (Oral)  Resp 18  SpO2 98%  Physical Exam  Nursing note and vitals reviewed. Constitutional: She appears well-developed and well-nourished. No distress.  HENT:  Head: Normocephalic and atraumatic.  Mouth/Throat: Uvula is midline, oropharynx is clear and moist and mucous membranes are normal. Mucous membranes are not dry.  Eyes: No scleral icterus.  Neck: Normal range of motion. Neck supple.  Cardiovascular: Normal rate.   No murmur heard. Pulmonary/Chest: Effort normal. No  respiratory distress. She has no wheezes.  Abdominal: Soft. She exhibits no distension and no mass. There is no tenderness. There is no rebound and no guarding.  Neurological: She is alert.  Skin: Skin is warm and dry. No rash noted. She is not diaphoretic.  Psychiatric: She has a normal mood and affect.    ED Course  Procedures (including critical care time)  Labs Reviewed  CBC WITH DIFFERENTIAL - Abnormal; Notable for the following:    Eosinophils Relative 7 (*)    All other components within normal limits  URINALYSIS, ROUTINE W REFLEX MICROSCOPIC  COMPREHENSIVE METABOLIC PANEL  LIPASE, BLOOD  POCT PREGNANCY, URINE   No results found.   1. Abdominal pain   2. Nausea     Room air sat is 98% and I interpret this to be normal.    After meds, pt felt improved and had tolerated po liquids without emesis.  Pt instructed to follow up as outpt for chronic abd pain, she has antiemetic pills and suppositories at home already.    MDM   Patient has no guarding or rebound on exam. Her vital signs are completely normal. She's not in any apparent distress. Her electrolytes were performed at triage and shows that she has not actually dehydrated and clinically she does not appear dehydrated. I suspect this has more to do with her chronic symptoms. She is encouraged again that she confined primary care followup especially since she has insurance. Plan is to give her an IM Phenergan and Dilaudid shot for her discomfort. Otherwise no prescriptions were provided and she is again urged to follow with her primary care physician. She assures me that she's not constipated and that she's been having regular bowel movements. She also has both Phenergan tablets and suppositories in good amounts at home and if she can tolerate some liquids here she will be discharged home.        Gavin Pound. Ghim, MD 11/18/12 1524

## 2013-03-28 ENCOUNTER — Emergency Department (HOSPITAL_COMMUNITY)
Admission: EM | Admit: 2013-03-28 | Discharge: 2013-03-28 | Disposition: A | Discharge status: Home / Self Care | Payer: Medicare Other | Attending: Emergency Medicine | Admitting: Emergency Medicine

## 2013-03-28 DIAGNOSIS — F17200 Nicotine dependence, unspecified, uncomplicated: Secondary | ICD-10-CM | POA: Insufficient documentation

## 2013-03-28 DIAGNOSIS — R109 Unspecified abdominal pain: Secondary | ICD-10-CM

## 2013-03-28 DIAGNOSIS — R079 Chest pain, unspecified: Secondary | ICD-10-CM | POA: Insufficient documentation

## 2013-03-28 DIAGNOSIS — Z862 Personal history of diseases of the blood and blood-forming organs and certain disorders involving the immune mechanism: Secondary | ICD-10-CM | POA: Insufficient documentation

## 2013-03-28 DIAGNOSIS — R112 Nausea with vomiting, unspecified: Secondary | ICD-10-CM | POA: Insufficient documentation

## 2013-03-28 DIAGNOSIS — Z87442 Personal history of urinary calculi: Secondary | ICD-10-CM | POA: Insufficient documentation

## 2013-03-28 DIAGNOSIS — Z3202 Encounter for pregnancy test, result negative: Secondary | ICD-10-CM | POA: Insufficient documentation

## 2013-03-28 DIAGNOSIS — Z91040 Latex allergy status: Secondary | ICD-10-CM | POA: Insufficient documentation

## 2013-03-28 LAB — CBC WITH DIFFERENTIAL/PLATELET
Basophils Absolute: 0 K/uL (ref 0.0–0.1)
Basophils Relative: 0 % (ref 0–1)
Eosinophils Absolute: 0.3 K/uL (ref 0.0–0.7)
Eosinophils Relative: 5 % (ref 0–5)
HCT: 35.5 % — ABNORMAL LOW (ref 36.0–46.0)
Hemoglobin: 12.6 g/dL (ref 12.0–15.0)
Lymphocytes Relative: 30 % (ref 12–46)
Lymphs Abs: 1.6 K/uL (ref 0.7–4.0)
MCH: 31.7 pg (ref 26.0–34.0)
MCHC: 35.5 g/dL (ref 30.0–36.0)
MCV: 89.4 fL (ref 78.0–100.0)
Monocytes Absolute: 0.4 K/uL (ref 0.1–1.0)
Monocytes Relative: 8 % (ref 3–12)
Neutro Abs: 3 K/uL (ref 1.7–7.7)
Neutrophils Relative %: 56 % (ref 43–77)
Platelets: 162 K/uL (ref 150–400)
RBC: 3.97 MIL/uL (ref 3.87–5.11)
RDW: 12.7 % (ref 11.5–15.5)
WBC: 5.4 K/uL (ref 4.0–10.5)

## 2013-03-28 LAB — URINALYSIS, ROUTINE W REFLEX MICROSCOPIC
Nitrite: NEGATIVE
Protein, ur: NEGATIVE mg/dL
Specific Gravity, Urine: 1.025 (ref 1.005–1.030)
Urobilinogen, UA: 0.2 mg/dL (ref 0.0–1.0)

## 2013-03-28 LAB — COMPREHENSIVE METABOLIC PANEL WITH GFR
ALT: 17 U/L (ref 0–35)
AST: 23 U/L (ref 0–37)
Albumin: 4 g/dL (ref 3.5–5.2)
Alkaline Phosphatase: 61 U/L (ref 39–117)
BUN: 11 mg/dL (ref 6–23)
CO2: 26 meq/L (ref 19–32)
Calcium: 9.6 mg/dL (ref 8.4–10.5)
Chloride: 104 meq/L (ref 96–112)
Creatinine, Ser: 0.8 mg/dL (ref 0.50–1.10)
GFR calc Af Amer: >90 mL/min
GFR calc non Af Amer: >90 mL/min
Glucose, Bld: 75 mg/dL (ref 70–99)
Potassium: 4 meq/L (ref 3.5–5.1)
Sodium: 138 meq/L (ref 135–145)
Total Bilirubin: 0.4 mg/dL (ref 0.3–1.2)
Total Protein: 7 g/dL (ref 6.0–8.3)

## 2013-03-28 LAB — POCT PREGNANCY, URINE: Preg Test, Ur: NEGATIVE

## 2013-03-28 MED ORDER — DIPHENHYDRAMINE HCL 50 MG/ML IJ SOLN: 25.0000 mg | Freq: Once | INTRAMUSCULAR | Status: DC

## 2013-03-28 MED ORDER — OXYCODONE-ACETAMINOPHEN 5-325 MG PO TABS: 2.0000 | ORAL_TABLET | ORAL | Status: DC | PRN

## 2013-03-28 MED ORDER — OXYCODONE-ACETAMINOPHEN 5-325 MG PO TABS
2.0000 | ORAL_TABLET | Freq: Once | ORAL | Status: AC
  Administered 2013-03-28: 2 via ORAL
  Filled 2013-03-28: qty 2

## 2013-03-28 MED ORDER — SODIUM CHLORIDE 0.9 % IV BOLUS (SEPSIS)
1000.0000 mL | Freq: Once | INTRAVENOUS | Status: AC
  Administered 2013-03-28: 1000 mL via INTRAVENOUS

## 2013-03-28 MED ORDER — DIPHENHYDRAMINE HCL 50 MG/ML IJ SOLN
INTRAMUSCULAR | Status: AC
  Filled 2013-03-28: qty 1

## 2013-03-28 MED ORDER — ONDANSETRON HCL 4 MG/2ML IJ SOLN
4.0000 mg | Freq: Once | INTRAMUSCULAR | Status: DC
  Filled 2013-03-28: qty 2

## 2013-03-28 MED ORDER — DIPHENHYDRAMINE HCL 50 MG/ML IJ SOLN
25.0000 mg | Freq: Once | INTRAMUSCULAR | Status: AC
  Administered 2013-03-28: 25 mg via INTRAVENOUS

## 2013-03-28 NOTE — ED Provider Notes (Signed)
History    CSN: [id number] Arrival date & time 03/28/13  1354  First MD Initiated Contact with Patient 03/28/13 1359     Chief Complaint  Patient presents with  . Chest Pain   (Consider location/radiation/quality/duration/timing/severity/associated sxs/prior Treatment) HPI Comments: 27 year old female with a history of multiple abdominal complaints of last year who presents with a complaint of midabdominal pain which is diffuse, nothing seems to make better or worse and it is associated with multiple episodes of nausea and vomiting over the last several days. She denies fevers chills coughing shortness of breath rashes diarrhea constipation blood in the stool or difficulty urinating. She is unsure if she is pregnant, she is to miscarriages in the past, she's never had an ectopic pregnancy and denies any history of STDs or vaginal discharge.  Patient is a 27 y.o. female presenting with chest pain. The history is provided by the patient, medical records and a friend.  Chest Pain  Past Medical History  Diagnosis Date  . Kidney stones   . Anemia    Past Surgical History  Procedure Laterality Date  . Cholecystectomy     No family history on file. History  Substance Use Topics  . Smoking status: Current Every Day Smoker  . Smokeless tobacco: Not on file  . Alcohol Use: No   OB History   Grav Para Term Preterm Abortions TAB SAB Ect Mult Living                 Review of Systems  Cardiovascular: Positive for chest pain.  All other systems reviewed and are negative.    Allergies  Reglan; Compazine; Donnatal; Haldol; Latex; Lidocaine; Mushroom extract complex; Orange fruit; Other; Penicillins; Toradol; Tramadol; and Zofran  Home Medications   Current Outpatient Rx  Name  Route  Sig  Dispense  Refill  . oxyCODONE-acetaminophen (PERCOCET/ROXICET) 5-325 MG per tablet   Oral   Take 2 tablets by mouth every 4 (four) hours as needed for pain.   10 tablet   0    LMP  02/13/2013 Physical Exam  Nursing note and vitals reviewed. Constitutional: She appears well-developed and well-nourished. No distress.  HENT:  Head: Normocephalic and atraumatic.  Mouth/Throat: Oropharynx is clear and moist. No oropharyngeal exudate.  Eyes: Conjunctivae and EOM are normal. Pupils are equal, round, and reactive to light. Right eye exhibits no discharge. Left eye exhibits no discharge. No scleral icterus.  Neck: Normal range of motion. Neck supple. No JVD present. No thyromegaly present.  Cardiovascular: Normal rate, regular rhythm, normal heart sounds and intact distal pulses.  Exam reveals no gallop and no friction rub.   No murmur heard. Pulmonary/Chest: Effort normal and breath sounds normal. No respiratory distress. She has no wheezes. She has no rales.  Abdominal: Soft. Bowel sounds are normal. She exhibits no distension and no mass. There is no tenderness.  Musculoskeletal: Normal range of motion. She exhibits no edema and no tenderness.  Lymphadenopathy:    She has no cervical adenopathy.  Neurological: She is alert. Coordination normal.  Skin: Skin is warm and dry. No rash noted. No erythema.  Psychiatric: She has a normal mood and affect. Her behavior is normal.    ED Course  Procedures (including critical care time) Labs Reviewed  CBC WITH DIFFERENTIAL - Abnormal; Notable for the following:    HCT 35.5 (*)    All other components within normal limits  URINALYSIS, ROUTINE W REFLEX MICROSCOPIC - Abnormal; Notable for the following:  Color, Urine AMBER (*)    Hgb urine dipstick TRACE (*)    Bilirubin Urine SMALL (*)    Ketones, ur 15 (*)    All other components within normal limits  URINE MICROSCOPIC-ADD ON - Abnormal; Notable for the following:    Squamous Epithelial / LPF FEW (*)    All other components within normal limits  COMPREHENSIVE METABOLIC PANEL  POCT PREGNANCY, URINE  CG4 I-STAT (LACTIC ACID)   No results found. 1. Abdominal pain   2.  Nausea and vomiting     MDM  The patient has an absolutely benign abdominal exam with no tenderness, no guarding, no distention, normal bowel sounds. It is very soft, her vitals are very normal. She will need a workup for possible sources of her abdominal pain though at this time I wonder if it's not just related to the nausea and vomiting. Will need a pregnancy test, urinalysis, labs.  Exam the patient is stable-appearing, soft abdomen again without tenderness, vital signs normal, laboratory workup is completely normal except for mild ketonuria. The patient has been informed of all of her results, with a normal lactic acid, normal white blood cell count and claiming of 4 days of abdominal pain I doubt that there is an acute surgical process.  Meds given in ED:  Medications  oxyCODONE-acetaminophen (PERCOCET/ROXICET) 5-325 MG per tablet 2 tablet (not administered)  sodium chloride 0.9 % bolus 1,000 mL (1,000 mLs Intravenous New Bag/Given 03/28/13 1505)  diphenhydrAMINE (BENADRYL) injection 25 mg (25 mg Intravenous Given 03/28/13 1505)    New Prescriptions   OXYCODONE-ACETAMINOPHEN (PERCOCET/ROXICET) 5-325 MG PER TABLET    Take 2 tablets by mouth every 4 (four) hours as needed for pain.      Vida Roller, MD 03/28/13 1556

## 2013-03-28 NOTE — ED Notes (Signed)
Per pt she has been having Abd pain with n/v and L upper chest pain.  No distress noted.  States that she has not been able to keep and food down x 5 days.  Resp symmetrical and unlabored. Skin warm and dry.

## 2013-03-29 ENCOUNTER — Encounter (HOSPITAL_COMMUNITY): Payer: Self-pay | Admitting: Emergency Medicine

## 2013-03-29 DIAGNOSIS — Z880 Allergy status to penicillin: Secondary | ICD-10-CM | POA: Insufficient documentation

## 2013-03-29 DIAGNOSIS — Z87442 Personal history of urinary calculi: Secondary | ICD-10-CM | POA: Insufficient documentation

## 2013-03-29 DIAGNOSIS — R112 Nausea with vomiting, unspecified: Secondary | ICD-10-CM | POA: Insufficient documentation

## 2013-03-29 DIAGNOSIS — R05 Cough: Secondary | ICD-10-CM | POA: Insufficient documentation

## 2013-03-29 DIAGNOSIS — M549 Dorsalgia, unspecified: Secondary | ICD-10-CM | POA: Insufficient documentation

## 2013-03-29 DIAGNOSIS — Z862 Personal history of diseases of the blood and blood-forming organs and certain disorders involving the immune mechanism: Secondary | ICD-10-CM | POA: Insufficient documentation

## 2013-03-29 DIAGNOSIS — R071 Chest pain on breathing: Secondary | ICD-10-CM | POA: Insufficient documentation

## 2013-03-29 DIAGNOSIS — Z3202 Encounter for pregnancy test, result negative: Secondary | ICD-10-CM | POA: Insufficient documentation

## 2013-03-29 DIAGNOSIS — Z91040 Latex allergy status: Secondary | ICD-10-CM | POA: Insufficient documentation

## 2013-03-29 DIAGNOSIS — R059 Cough, unspecified: Secondary | ICD-10-CM | POA: Insufficient documentation

## 2013-03-29 DIAGNOSIS — F17200 Nicotine dependence, unspecified, uncomplicated: Secondary | ICD-10-CM | POA: Insufficient documentation

## 2013-03-29 NOTE — ED Notes (Signed)
C/o constant stabbing L sided chest pain, nausea, vomiting, and R sided flank pain x 1 week. Denies sob.  Pain with urination.

## 2013-03-30 ENCOUNTER — Emergency Department (HOSPITAL_COMMUNITY)
Admission: EM | Admit: 2013-03-30 | Discharge: 2013-03-30 | Disposition: A | Discharge status: Home / Self Care | Payer: Medicare Other | Attending: Emergency Medicine | Admitting: Emergency Medicine

## 2013-03-30 ENCOUNTER — Emergency Department (HOSPITAL_COMMUNITY): Payer: Medicare Other

## 2013-03-30 DIAGNOSIS — R110 Nausea: Secondary | ICD-10-CM

## 2013-03-30 DIAGNOSIS — R0789 Other chest pain: Secondary | ICD-10-CM

## 2013-03-30 LAB — COMPREHENSIVE METABOLIC PANEL
Alkaline Phosphatase: 61 U/L (ref 39–117)
BUN: 12 mg/dL (ref 6–23)
Calcium: 9.6 mg/dL (ref 8.4–10.5)
GFR calc Af Amer: >90 mL/min (ref >90)
GFR calc non Af Amer: >90 mL/min (ref >90)
Glucose, Bld: 81 mg/dL (ref 70–99)
Total Protein: 7.3 g/dL (ref 6.0–8.3)

## 2013-03-30 LAB — CBC WITH DIFFERENTIAL/PLATELET
Eosinophils Absolute: 0.2 10*3/uL (ref 0.0–0.7)
Eosinophils Relative: 4 % (ref 0–5)
HCT: 35.1 % — ABNORMAL LOW (ref 36.0–46.0)
Hemoglobin: 12.5 g/dL (ref 12.0–15.0)
Lymphs Abs: 1.7 10*3/uL (ref 0.7–4.0)
MCH: 31.8 pg (ref 26.0–34.0)
MCV: 89.3 fL (ref 78.0–100.0)
Monocytes Relative: 8 % (ref 3–12)
RBC: 3.93 MIL/uL (ref 3.87–5.11)

## 2013-03-30 LAB — URINALYSIS, ROUTINE W REFLEX MICROSCOPIC
Ketones, ur: NEGATIVE mg/dL
Leukocytes, UA: NEGATIVE
Nitrite: NEGATIVE
Urobilinogen, UA: 0.2 mg/dL (ref 0.0–1.0)
pH: 6.5 (ref 5.0–8.0)

## 2013-03-30 LAB — POCT PREGNANCY, URINE: Preg Test, Ur: NEGATIVE

## 2013-03-30 MED ORDER — ACETAMINOPHEN 325 MG PO TABS
650.0000 mg | ORAL_TABLET | Freq: Once | ORAL | Status: DC
  Filled 2013-03-30: qty 2

## 2013-03-30 NOTE — ED Notes (Signed)
Pt comfortable with d/c and f/u instructions. 

## 2013-03-30 NOTE — ED Provider Notes (Signed)
History    CSN: [id number] Arrival date & time 03/29/13  2334  None    Chief Complaint  Patient presents with  . Chest Pain   (Consider location/radiation/quality/duration/timing/severity/associated sxs/prior Treatment) HPI Comments: PAteint presents to the ED tonight giving a history of 7 days of intermittent cough, 4-5 days of nausea and vomiting only after eating Was seen 1 day ago for the nausea and vomiting with no mention of CP at that time had labs and RX given fro Percocet which she has not filled nor admits to having   Patient is a 27 y.o. female presenting with chest pain. The history is provided by the patient.  Chest Pain Pain location:  L chest Pain quality: aching and stabbing   Pain radiates to:  Lower back Pain radiates to the back: no   Pain severity:  Moderate Onset quality:  Gradual Duration:  7 days Timing:  Intermittent Chronicity:  New Worsened by:  Coughing Associated symptoms: back pain and cough   Associated symptoms: no dizziness, no fever, no headache and no shortness of breath    Past Medical History  Diagnosis Date  . Kidney stones   . Anemia    Past Surgical History  Procedure Laterality Date  . Cholecystectomy     No family history on file. History  Substance Use Topics  . Smoking status: Current Every Day Smoker  . Smokeless tobacco: Not on file  . Alcohol Use: No   OB History   Grav Para Term Preterm Abortions TAB SAB Ect Mult Living                 Review of Systems  Constitutional: Negative for fever and chills.  Respiratory: Positive for cough. Negative for shortness of breath.   Cardiovascular: Positive for chest pain.  Musculoskeletal: Positive for back pain.  Skin: Negative for pallor and rash.  Neurological: Negative for dizziness and headaches.  All other systems reviewed and are negative.    Allergies  Reglan; Compazine; Donnatal; Haldol; Latex; Lidocaine; Mushroom extract complex; Orange fruit; Other;  Penicillins; Toradol; Tramadol; and Zofran  Home Medications   Current Outpatient Rx  Name  Route  Sig  Dispense  Refill  . oxyCODONE-acetaminophen (PERCOCET/ROXICET) 5-325 MG per tablet   Oral   Take 2 tablets by mouth every 4 (four) hours as needed for pain.   10 tablet   0    BP 101/66  Pulse 65  Temp(Src) 98.6 F (37 C) (Oral)  Resp 14  SpO2 100%  LMP 02/13/2013 Physical Exam  Nursing note and vitals reviewed. Constitutional: She is oriented to person, place, and time. She appears well-developed and well-nourished.  HENT:  Head: Normocephalic.  Eyes: Pupils are equal, round, and reactive to light.  Neck: Normal range of motion.  Cardiovascular: Normal rate and regular rhythm.   Pulmonary/Chest: Effort normal and breath sounds normal. No respiratory distress. She has no wheezes.  Abdominal: Soft. Bowel sounds are normal.  Musculoskeletal: Normal range of motion.  Neurological: She is alert and oriented to person, place, and time.  Skin: Skin is warm. No rash noted. No pallor.    ED Course  Procedures (including critical care time) Labs Reviewed  CBC WITH DIFFERENTIAL - Abnormal; Notable for the following:    HCT 35.1 (*)    All other components within normal limits  COMPREHENSIVE METABOLIC PANEL  URINALYSIS, ROUTINE W REFLEX MICROSCOPIC  POCT I-STAT TROPONIN I  POCT PREGNANCY, URINE   Dg Chest 2 View  03/30/2013   *RADIOLOGY REPORT*  Clinical Data: Left-sided chest pain  CHEST - 2 VIEW  Comparison: Prior radiograph from 03/18/2010  FINDINGS:  Cardiac and mediastinal silhouettes within normal limits.  Lungs are well inflated.  The lungs are clear without airspace consolidation, pleural effusion, or pulmonary edema.  No pneumothorax.  No acute osseous abnormality.  Cholecystectomy clips are noted.  Findings:  IMPRESSION: No acute cardiopulmonary process.   Original Report Authenticated By: Rise Mu, M.D.   1. Chest wall pain   2. Nausea     MDM  Chest  xray is normal patient has been encouraged to fill her prescriptions she was given a resource list. She was not given any antiemetic as she lists them as allergies   Arman Filter, NP 03/30/13 0411

## 2013-03-30 NOTE — ED Provider Notes (Signed)
Medical screening examination/treatment/procedure(s) were performed by non-physician practitioner and as supervising physician I was immediately available for consultation/collaboration.  Olivia Mackie, MD 03/30/13 [phone]

## 2013-03-31 ENCOUNTER — Emergency Department (HOSPITAL_COMMUNITY)
Admission: EM | Admit: 2013-03-31 | Discharge: 2013-03-31 | Disposition: A | Discharge status: Home / Self Care | Payer: Medicare Other | Attending: Emergency Medicine | Admitting: Emergency Medicine

## 2013-03-31 ENCOUNTER — Encounter (HOSPITAL_COMMUNITY): Payer: Self-pay | Admitting: Emergency Medicine

## 2013-03-31 DIAGNOSIS — Z862 Personal history of diseases of the blood and blood-forming organs and certain disorders involving the immune mechanism: Secondary | ICD-10-CM | POA: Insufficient documentation

## 2013-03-31 DIAGNOSIS — Z87442 Personal history of urinary calculi: Secondary | ICD-10-CM | POA: Insufficient documentation

## 2013-03-31 DIAGNOSIS — N72 Inflammatory disease of cervix uteri: Secondary | ICD-10-CM | POA: Diagnosis present

## 2013-03-31 DIAGNOSIS — Z880 Allergy status to penicillin: Secondary | ICD-10-CM | POA: Insufficient documentation

## 2013-03-31 DIAGNOSIS — F17200 Nicotine dependence, unspecified, uncomplicated: Secondary | ICD-10-CM | POA: Insufficient documentation

## 2013-03-31 DIAGNOSIS — Z91040 Latex allergy status: Secondary | ICD-10-CM | POA: Insufficient documentation

## 2013-03-31 DIAGNOSIS — R112 Nausea with vomiting, unspecified: Secondary | ICD-10-CM | POA: Insufficient documentation

## 2013-03-31 DIAGNOSIS — Z3202 Encounter for pregnancy test, result negative: Secondary | ICD-10-CM | POA: Insufficient documentation

## 2013-03-31 DIAGNOSIS — N898 Other specified noninflammatory disorders of vagina: Secondary | ICD-10-CM | POA: Insufficient documentation

## 2013-03-31 DIAGNOSIS — Z79899 Other long term (current) drug therapy: Secondary | ICD-10-CM | POA: Insufficient documentation

## 2013-03-31 LAB — URINALYSIS, ROUTINE W REFLEX MICROSCOPIC
Bilirubin Urine: NEGATIVE
Glucose, UA: NEGATIVE mg/dL
Ketones, ur: NEGATIVE mg/dL
Nitrite: NEGATIVE
pH: 7 (ref 5.0–8.0)

## 2013-03-31 LAB — POCT PREGNANCY, URINE: Preg Test, Ur: NEGATIVE

## 2013-03-31 MED ORDER — CEFTRIAXONE SODIUM 250 MG IJ SOLR
250.0000 mg | Freq: Once | INTRAMUSCULAR | Status: AC
  Administered 2013-03-31: 250 mg via INTRAMUSCULAR
  Filled 2013-03-31: qty 250

## 2013-03-31 MED ORDER — HYDROMORPHONE HCL PF 1 MG/ML IJ SOLN
1.0000 mg | Freq: Once | INTRAMUSCULAR | Status: AC
  Administered 2013-03-31: 1 mg via INTRAMUSCULAR
  Filled 2013-03-31: qty 1

## 2013-03-31 MED ORDER — LIDOCAINE HCL (PF) 1 % IJ SOLN
INTRAMUSCULAR | Status: AC
  Filled 2013-03-31: qty 5

## 2013-03-31 MED ORDER — AZITHROMYCIN 250 MG PO TABS
1000.0000 mg | ORAL_TABLET | Freq: Once | ORAL | Status: AC
  Administered 2013-03-31: 1000 mg via ORAL
  Filled 2013-03-31: qty 4

## 2013-03-31 MED ORDER — METRONIDAZOLE 500 MG PO TABS: 500.0000 mg | ORAL_TABLET | Freq: Two times a day (BID) | ORAL | Status: AC

## 2013-03-31 NOTE — ED Notes (Signed)
Pt signed, but pad not capturing.

## 2013-03-31 NOTE — ED Notes (Signed)
Patient with abdominal pain for the last three days.  Patient states she is having some white vaginal discharge with the pain.  Patient states she is having some nausea and vomiting.  Patient states she vomited pta to ED.  Patient states that it happens after she eats.

## 2013-03-31 NOTE — ED Provider Notes (Signed)
History    CSN: [id number] Arrival date & time 03/31/13  2141  First MD Initiated Contact with Patient 03/31/13 2147     Chief Complaint  Patient presents with  . Abdominal Pain   (Consider location/radiation/quality/duration/timing/severity/associated sxs/prior Treatment) HPI Paula Little 26 y.o. who was seen here yesterday for chest pain and was also seen here on July 19 for abdominal pain presents for abdominal pain. Patient states the abdominal pain is generalized aching in character, nonradiating, 7/10, no known exacerbating or relieving factors, associated with vaginal discharge with foul odor, and nausea and vomiting. Patient is sexually active but denies having STIs. Patient states she also is having mild dysuria. She denies fevers chills or sweats.  Past Medical History  Diagnosis Date  . Kidney stones   . Anemia    Past Surgical History  Procedure Laterality Date  . Cholecystectomy     No family history on file. History  Substance Use Topics  . Smoking status: Current Every Day Smoker    Types: Cigarettes  . Smokeless tobacco: Not on file  . Alcohol Use: No   OB History   Grav Para Term Preterm Abortions TAB SAB Ect Mult Living                 Review of Systems  Constitutional: Negative for fever, chills, diaphoresis, activity change and appetite change.  HENT: Negative for sore throat, rhinorrhea, sneezing, drooling and trouble swallowing.   Eyes: Negative for discharge and redness.  Respiratory: Negative for cough, chest tightness, shortness of breath, wheezing and stridor.   Cardiovascular: Negative for chest pain and leg swelling.  Gastrointestinal: Positive for nausea, vomiting and abdominal pain. Negative for diarrhea, constipation and blood in stool.  Genitourinary: Positive for vaginal discharge. Negative for difficulty urinating.  Musculoskeletal: Negative for myalgias and arthralgias.  Skin: Negative for pallor.  Neurological: Negative for  dizziness, syncope, speech difficulty, weakness, light-headedness and headaches.  Hematological: Negative for adenopathy. Does not bruise/bleed easily.  Psychiatric/Behavioral: Negative for confusion and agitation.    Allergies  Compazine; Donnatal; Haldol; Latex; Lidocaine; Mushroom extract complex; Orange fruit; Other; Penicillins; Reglan; Toradol; Tramadol; and Zofran  Home Medications   Current Outpatient Rx  Name  Route  Sig  Dispense  Refill  . oxyCODONE-acetaminophen (PERCOCET/ROXICET) 5-325 MG per tablet   Oral   Take 1 tablet by mouth every 6 (six) hours as needed for pain.         . metroNIDAZOLE (FLAGYL) 500 MG tablet   Oral   Take 1 tablet (500 mg total) by mouth 2 (two) times daily.   14 tablet   0    BP 109/74  Pulse 71  Temp(Src) 98.4 F (36.9 C) (Oral)  Resp 16  SpO2 100%  LMP 02/13/2013 Physical Exam  Constitutional: She is oriented to person, place, and time. She appears well-developed and well-nourished. No distress.  HENT:  Head: Normocephalic and atraumatic.  Right Ear: External ear normal.  Left Ear: External ear normal.  Eyes: Conjunctivae and EOM are normal. Right eye exhibits no discharge. Left eye exhibits no discharge.  Neck: Normal range of motion. Neck supple. No JVD present.  Cardiovascular: Normal rate, regular rhythm and normal heart sounds.  Exam reveals no gallop and no friction rub.   No murmur heard. Pulmonary/Chest: Effort normal and breath sounds normal. No stridor. No respiratory distress. She has no wheezes. She has no rales. She exhibits no tenderness.  Abdominal: Soft. Bowel sounds are normal. She exhibits no  distension and no mass. There is tenderness (Generalized lower abdomen). There is no rebound and no guarding.  Genitourinary: There is no rash on the right labia. There is no rash on the left labia. Cervix exhibits motion tenderness (Strawberry appearance), discharge (White) and friability. Right adnexum displays no mass and  no tenderness. Left adnexum displays no mass and no tenderness.  Musculoskeletal: Normal range of motion. She exhibits no edema.  Neurological: She is alert and oriented to person, place, and time.  Skin: Skin is warm. No rash noted. She is not diaphoretic.  Psychiatric: She has a normal mood and affect. Her behavior is normal.    ED Course  Procedures (including critical care time) Labs Reviewed  URINALYSIS, ROUTINE W REFLEX MICROSCOPIC - Abnormal; Notable for the following:    APPearance CLOUDY (*)    Hgb urine dipstick MODERATE (*)    All other components within normal limits  GC/CHLAMYDIA PROBE AMP  WET PREP, GENITAL  URINE MICROSCOPIC-ADD ON  POCT PREGNANCY, URINE   Dg Chest 2 View  03/30/2013   *RADIOLOGY REPORT*  Clinical Data: Left-sided chest pain  CHEST - 2 VIEW  Comparison: Prior radiograph from 03/18/2010  FINDINGS:  Cardiac and mediastinal silhouettes within normal limits.  Lungs are well inflated.  The lungs are clear without airspace consolidation, pleural effusion, or pulmonary edema.  No pneumothorax.  No acute osseous abnormality.  Cholecystectomy clips are noted.  Findings:  IMPRESSION: No acute cardiopulmonary process.   Original Report Authenticated By: Rise Mu, M.D.   1. Cervicitis    Results for orders placed during the hospital encounter of 03/31/13  URINALYSIS, ROUTINE W REFLEX MICROSCOPIC      Result Value Range   Color, Urine YELLOW  YELLOW   APPearance CLOUDY (*) CLEAR   Specific Gravity, Urine 1.020  1.005 - 1.030   pH 7.0  5.0 - 8.0   Glucose, UA NEGATIVE  NEGATIVE mg/dL   Hgb urine dipstick MODERATE (*) NEGATIVE   Bilirubin Urine NEGATIVE  NEGATIVE   Ketones, ur NEGATIVE  NEGATIVE mg/dL   Protein, ur NEGATIVE  NEGATIVE mg/dL   Urobilinogen, UA 0.2  0.0 - 1.0 mg/dL   Nitrite NEGATIVE  NEGATIVE   Leukocytes, UA NEGATIVE  NEGATIVE  URINE MICROSCOPIC-ADD ON      Result Value Range   Squamous Epithelial / LPF RARE  RARE   WBC, UA 0-2  <3  WBC/hpf   RBC / HPF 3-6  <3 RBC/hpf   Bacteria, UA RARE  RARE   Urine-Other AMORPHOUS URATES/PHOSPHATES    POCT PREGNANCY, URINE      Result Value Range   Preg Test, Ur NEGATIVE  NEGATIVE    MDM  Paula Little 27 y.o. with multiple previous emergency department visits including yesterday for chest pain and 2 days ago for abdominal pain presents for abdominal pain. Workup for abdominal pain 2 days ago showed no biliary or pancreatic etiology of her pain. Afebrile vital signs are stable. Generalized lower abnormal tenderness on exam. No signs or symptoms of peritonitis. Pelvic exam consistent with cervicitis. Will obtain UA, GC Chlamydia, wet prep, and urine pregnancy.  Will treat presumptively based on exam for Chlamydia, gonorrhea, and trichomonas. Doubt PID.  UA negative. Patient given Rocephin and azithromycin in the emergency partner. Patient was sent home with a prescription for Flagyl. She was given return precautions. He was instructed to followup with her PCP to establish primary care.  Labs reviewed. I discussed this patient's care with my attending,  Dr. Bernette Mayers.  Sena Hitch, MD 03/31/13 2308

## 2013-03-31 NOTE — ED Provider Notes (Signed)
I saw and evaluated the patient, reviewed the resident's note and I agree with the findings and plan.  Pt with multiple chronic pain complaints. Abd benign, treat for cervicitis.   Charles B. Bernette Mayers, MD 03/31/13 2315

## 2013-04-01 LAB — WET PREP, GENITAL
Trich, Wet Prep: NONE SEEN
WBC, Wet Prep HPF POC: NONE SEEN
Yeast Wet Prep HPF POC: NONE SEEN

## 2013-04-01 LAB — GC/CHLAMYDIA PROBE AMP: GC Probe RNA: NEGATIVE

## 2013-05-29 ENCOUNTER — Emergency Department (HOSPITAL_COMMUNITY)
Admission: EM | Admit: 2013-05-29 | Discharge: 2013-05-29 | Disposition: A | Discharge status: Home / Self Care | Payer: Medicare (Managed Care) | Attending: Emergency Medicine | Admitting: Emergency Medicine

## 2013-05-29 ENCOUNTER — Encounter (HOSPITAL_COMMUNITY): Payer: Self-pay

## 2013-05-29 DIAGNOSIS — F17200 Nicotine dependence, unspecified, uncomplicated: Secondary | ICD-10-CM | POA: Insufficient documentation

## 2013-05-29 DIAGNOSIS — G8929 Other chronic pain: Secondary | ICD-10-CM

## 2013-05-29 DIAGNOSIS — Z87442 Personal history of urinary calculi: Secondary | ICD-10-CM | POA: Insufficient documentation

## 2013-05-29 DIAGNOSIS — R1110 Vomiting, unspecified: Secondary | ICD-10-CM | POA: Insufficient documentation

## 2013-05-29 DIAGNOSIS — Z862 Personal history of diseases of the blood and blood-forming organs and certain disorders involving the immune mechanism: Secondary | ICD-10-CM | POA: Insufficient documentation

## 2013-05-29 DIAGNOSIS — Z91040 Latex allergy status: Secondary | ICD-10-CM | POA: Insufficient documentation

## 2013-05-29 DIAGNOSIS — Z9889 Other specified postprocedural states: Secondary | ICD-10-CM | POA: Insufficient documentation

## 2013-05-29 DIAGNOSIS — Z880 Allergy status to penicillin: Secondary | ICD-10-CM | POA: Insufficient documentation

## 2013-05-29 DIAGNOSIS — R1031 Right lower quadrant pain: Secondary | ICD-10-CM | POA: Insufficient documentation

## 2013-05-29 DIAGNOSIS — Z3202 Encounter for pregnancy test, result negative: Secondary | ICD-10-CM | POA: Insufficient documentation

## 2013-05-29 LAB — URINALYSIS, ROUTINE W REFLEX MICROSCOPIC
Leukocytes, UA: NEGATIVE
Nitrite: NEGATIVE
Protein, ur: NEGATIVE mg/dL
Urobilinogen, UA: 1 mg/dL (ref 0.0–1.0)

## 2013-05-29 LAB — COMPREHENSIVE METABOLIC PANEL
AST: 56 U/L — ABNORMAL HIGH (ref 0–37)
CO2: 31 mEq/L (ref 19–32)
Calcium: 9.4 mg/dL (ref 8.4–10.5)
Chloride: 104 mEq/L (ref 96–112)
Creatinine, Ser: 0.88 mg/dL (ref 0.50–1.10)
GFR calc Af Amer: >90 mL/min (ref >90)
GFR calc non Af Amer: 89 mL/min — ABNORMAL LOW (ref >90)
Glucose, Bld: 100 mg/dL — ABNORMAL HIGH (ref 70–99)
Sodium: 141 mEq/L (ref 135–145)
Total Bilirubin: 0.1 mg/dL — ABNORMAL LOW (ref 0.3–1.2)

## 2013-05-29 LAB — POCT PREGNANCY, URINE: Preg Test, Ur: NEGATIVE

## 2013-05-29 LAB — CBC WITH DIFFERENTIAL/PLATELET
Basophils Absolute: 0 10*3/uL (ref 0.0–0.1)
Eosinophils Relative: 5 % (ref 0–5)
HCT: 35.9 % — ABNORMAL LOW (ref 36.0–46.0)
Lymphocytes Relative: 39 % (ref 12–46)
MCH: 31.7 pg (ref 26.0–34.0)
MCV: 91.1 fL (ref 78.0–100.0)
Monocytes Absolute: 0.3 10*3/uL (ref 0.1–1.0)
Platelets: 225 10*3/uL (ref 150–400)
RDW: 13.2 % (ref 11.5–15.5)
WBC: 5.8 10*3/uL (ref 4.0–10.5)

## 2013-05-29 LAB — LIPASE, BLOOD: Lipase: 32 U/L (ref 11–59)

## 2013-05-29 MED ORDER — PROMETHAZINE HCL 25 MG PO TABS: 25.0000 mg | ORAL_TABLET | Freq: Four times a day (QID) | ORAL | Status: DC | PRN

## 2013-05-29 MED ORDER — ONDANSETRON 4 MG PO TBDP
ORAL_TABLET | ORAL | Status: AC
  Filled 2013-05-29: qty 2

## 2013-05-29 MED ORDER — PROMETHAZINE HCL 25 MG PO TABS
25.0000 mg | ORAL_TABLET | Freq: Once | ORAL | Status: AC
  Administered 2013-05-29: 25 mg via ORAL
  Filled 2013-05-29 (×2): qty 1

## 2013-05-29 MED ORDER — OXYCODONE-ACETAMINOPHEN 5-325 MG PO TABS
2.0000 | ORAL_TABLET | Freq: Once | ORAL | Status: AC
  Administered 2013-05-29: 2 via ORAL
  Filled 2013-05-29: qty 2

## 2013-05-29 NOTE — ED Provider Notes (Signed)
CSN: [id number]     Arrival date & time 05/29/13  1857 History   First MD Initiated Contact with Patient 05/29/13 2138     Chief Complaint  Patient presents with  . Abdominal Pain  . Emesis   (Consider location/radiation/quality/duration/timing/severity/associated sxs/prior Treatment) Patient is a 27 y.o. female presenting with abdominal pain and vomiting.  Abdominal Pain Associated symptoms: vomiting   Emesis Associated symptoms: abdominal pain    Pt with history of chronic abdominal pain reports 4 days of right lower abdominal pain and R flank pain, associated with multiple episodes of vomiting but no fever, diarrhea or dysuria. Also complaining of chest soreness.   Past Medical History  Diagnosis Date  . Kidney stones   . Anemia    Past Surgical History  Procedure Laterality Date  . Cholecystectomy     History reviewed. No pertinent family history. History  Substance Use Topics  . Smoking status: Current Every Day Smoker    Types: Cigarettes  . Smokeless tobacco: Not on file  . Alcohol Use: No   OB History   Grav Para Term Preterm Abortions TAB SAB Ect Mult Living                 Review of Systems  Gastrointestinal: Positive for vomiting and abdominal pain.   All other systems reviewed and are negative except as noted in HPI.   Allergies  Compazine; Donnatal; Haldol; Latex; Lidocaine; Mushroom extract complex; Orange fruit; Other; Penicillins; Reglan; Toradol; Tramadol; and Zofran  Home Medications  No current outpatient prescriptions on file. BP 110/71  Pulse 67  Temp(Src) 98.2 F (36.8 C) (Oral)  Resp 16  Ht 5\' 3"  (1.6 m)  Wt 147 lb (66.679 kg)  BMI 26.05 kg/m2  SpO2 98%  LMP 05/20/2013 Physical Exam  Nursing note and vitals reviewed. Constitutional: She is oriented to person, place, and time. She appears well-developed and well-nourished.  HENT:  Head: Normocephalic and atraumatic.  Eyes: EOM are normal. Pupils are equal, round, and reactive to  light.  Neck: Normal range of motion. Neck supple.  Cardiovascular: Normal rate, normal heart sounds and intact distal pulses.   Pulmonary/Chest: Effort normal and breath sounds normal.  Abdominal: Soft. Bowel sounds are normal. She exhibits no distension. There is no tenderness (no RLQ or RUQ tenderness). There is no rebound and no guarding.  Musculoskeletal: Normal range of motion. She exhibits no edema and no tenderness.  Neurological: She is alert and oriented to person, place, and time. She has normal strength. No cranial nerve deficit or sensory deficit.  Skin: Skin is warm and dry. No rash noted.  Psychiatric: She has a normal mood and affect.    ED Course  Procedures (including critical care time) Labs Review Labs Reviewed  CBC WITH DIFFERENTIAL - Abnormal; Notable for the following:    HCT 35.9 (*)    All other components within normal limits  COMPREHENSIVE METABOLIC PANEL - Abnormal; Notable for the following:    Glucose, Bld 100 (*)    AST 56 (*)    ALT 69 (*)    Total Bilirubin 0.1 (*)    GFR calc non Af Amer 89 (*)    All other components within normal limits  URINALYSIS, ROUTINE W REFLEX MICROSCOPIC - Abnormal; Notable for the following:    APPearance HAZY (*)    All other components within normal limits  LIPASE, BLOOD  POCT PREGNANCY, URINE   Imaging Review No results found.  MDM   1. Chronic  abdominal pain     Labs ordered in triage reviewed and unremarkable. Offered oral nausea and pain meds in the ED. No clinical signs of dehydration and no electrolyte abnormalities.    Modesty Rudy B. Bernette Mayers, MD 05/29/13 2246

## 2013-05-29 NOTE — ED Notes (Signed)
Pt given ginger ale for oral challenge. Dr. Bernette Mayers spoke with pt regarding history and pain medication use.

## 2013-05-29 NOTE — ED Notes (Signed)
Gave pt phenergan, pt requesting pain medication. EDP aware and states if pt does not vomit with phenergan and can drink fluids and keep them down, then they will discuss pain medication.

## 2013-05-29 NOTE — ED Notes (Signed)
Tolerated PO intacke well, asking for crackers, oj and peanut butter,.

## 2013-05-29 NOTE — ED Notes (Signed)
Pt presents with multiple complaints, states she is getting "the case of ovarin cyst, kidney stone, and the stomach virus." Pt c./o LUQ and RLQ pain, nausea, vomiting, and Right lower back pain x4 days.

## 2013-05-31 ENCOUNTER — Emergency Department (HOSPITAL_COMMUNITY)
Admission: EM | Admit: 2013-05-31 | Discharge: 2013-05-31 | Disposition: A | Discharge status: Home / Self Care | Payer: Medicare Other | Attending: Emergency Medicine | Admitting: Emergency Medicine

## 2013-05-31 ENCOUNTER — Encounter (HOSPITAL_COMMUNITY): Payer: Self-pay | Admitting: Emergency Medicine

## 2013-05-31 DIAGNOSIS — Z862 Personal history of diseases of the blood and blood-forming organs and certain disorders involving the immune mechanism: Secondary | ICD-10-CM | POA: Insufficient documentation

## 2013-05-31 DIAGNOSIS — Z880 Allergy status to penicillin: Secondary | ICD-10-CM | POA: Insufficient documentation

## 2013-05-31 DIAGNOSIS — R109 Unspecified abdominal pain: Secondary | ICD-10-CM | POA: Insufficient documentation

## 2013-05-31 DIAGNOSIS — Z91040 Latex allergy status: Secondary | ICD-10-CM | POA: Insufficient documentation

## 2013-05-31 DIAGNOSIS — Z9089 Acquired absence of other organs: Secondary | ICD-10-CM | POA: Insufficient documentation

## 2013-05-31 DIAGNOSIS — F17200 Nicotine dependence, unspecified, uncomplicated: Secondary | ICD-10-CM | POA: Insufficient documentation

## 2013-05-31 DIAGNOSIS — R1110 Vomiting, unspecified: Secondary | ICD-10-CM | POA: Insufficient documentation

## 2013-05-31 DIAGNOSIS — G8929 Other chronic pain: Secondary | ICD-10-CM

## 2013-05-31 DIAGNOSIS — Z3202 Encounter for pregnancy test, result negative: Secondary | ICD-10-CM | POA: Insufficient documentation

## 2013-05-31 DIAGNOSIS — Z87442 Personal history of urinary calculi: Secondary | ICD-10-CM | POA: Insufficient documentation

## 2013-05-31 LAB — BASIC METABOLIC PANEL
Calcium: 10 mg/dL (ref 8.4–10.5)
Creatinine, Ser: 0.73 mg/dL (ref 0.50–1.10)
GFR calc non Af Amer: >90 mL/min (ref >90)
Glucose, Bld: 78 mg/dL (ref 70–99)
Potassium: 4 mEq/L (ref 3.5–5.1)
Sodium: 139 mEq/L (ref 135–145)

## 2013-05-31 LAB — WET PREP, GENITAL
Clue Cells Wet Prep HPF POC: NONE SEEN
Trich, Wet Prep: NONE SEEN

## 2013-05-31 LAB — CBC WITH DIFFERENTIAL/PLATELET
Basophils Relative: 0 % (ref 0–1)
Eosinophils Absolute: 0.3 10*3/uL (ref 0.0–0.7)
Eosinophils Relative: 6 % — ABNORMAL HIGH (ref 0–5)
Lymphs Abs: 1.9 10*3/uL (ref 0.7–4.0)
MCH: 31.3 pg (ref 26.0–34.0)
MCV: 90.7 fL (ref 78.0–100.0)
Neutrophils Relative %: 52 % (ref 43–77)
Platelets: 223 10*3/uL (ref 150–400)
RBC: 3.87 MIL/uL (ref 3.87–5.11)
RDW: 13 % (ref 11.5–15.5)

## 2013-05-31 LAB — URINALYSIS, ROUTINE W REFLEX MICROSCOPIC
Bilirubin Urine: NEGATIVE
Ketones, ur: NEGATIVE mg/dL
Leukocytes, UA: NEGATIVE
Nitrite: NEGATIVE
Protein, ur: NEGATIVE mg/dL
Urobilinogen, UA: 0.2 mg/dL (ref 0.0–1.0)

## 2013-05-31 MED ORDER — PROMETHAZINE HCL 25 MG/ML IJ SOLN
25.0000 mg | Freq: Once | INTRAMUSCULAR | Status: AC
  Administered 2013-05-31: 25 mg via INTRAVENOUS
  Filled 2013-05-31: qty 1

## 2013-05-31 MED ORDER — OXYCODONE-ACETAMINOPHEN 5-325 MG PO TABS
1.0000 | ORAL_TABLET | Freq: Once | ORAL | Status: AC
  Administered 2013-05-31: 1 via ORAL
  Filled 2013-05-31: qty 1

## 2013-05-31 MED ORDER — MORPHINE SULFATE 4 MG/ML IJ SOLN
4.0000 mg | Freq: Once | INTRAMUSCULAR | Status: AC
  Administered 2013-05-31: 4 mg via INTRAVENOUS
  Filled 2013-05-31: qty 1

## 2013-05-31 MED ORDER — SODIUM CHLORIDE 0.9 % IV BOLUS (SEPSIS)
1000.0000 mL | Freq: Once | INTRAVENOUS | Status: AC
  Administered 2013-05-31: 1000 mL via INTRAVENOUS

## 2013-05-31 NOTE — ED Notes (Signed)
Pt requesting sandwich, sprite, crackers and peanut butter.

## 2013-05-31 NOTE — ED Notes (Signed)
Pt reports n/v, RLQ pain, R flank pain and chest discomfort for past 5 days. Denies sob, diarrhea, or dysuria. Pt seen at [facility] 2 days ago for same, pt states abd pain is now generalized.

## 2013-06-02 LAB — GC/CHLAMYDIA PROBE AMP
CT Probe RNA: UNDETERMINED
GC Probe RNA: UNDETERMINED

## 2013-06-02 LAB — URINE CULTURE
Colony Count: NO GROWTH
Culture: NO GROWTH

## 2013-06-03 NOTE — ED Provider Notes (Signed)
CSN: [id number]     Arrival date & time 05/31/13  1245 History   First MD Initiated Contact with Patient 05/31/13 1329     Chief Complaint  Patient presents with  . Abdominal Pain  . Emesis  . Chest Pain  . Flank Pain   (Consider location/radiation/quality/duration/timing/severity/associated sxs/prior Treatment) Patient is a 27 y.o. female presenting with abdominal pain, vomiting, chest pain, and flank pain. The history is provided by the patient.  Abdominal Pain Pain location:  RLQ and R flank Pain quality: sharp   Pain radiates to:  Does not radiate Pain severity:  Moderate Onset quality:  Gradual Duration:  5 days Timing:  Constant Progression:  Unchanged Chronicity:  Recurrent Relieved by:  Nothing Worsened by:  Nothing tried Ineffective treatments:  None tried Associated symptoms: vomiting   Associated symptoms: no fever, no vaginal bleeding and no vaginal discharge   Emesis Associated symptoms: abdominal pain   Chest Pain Associated symptoms: abdominal pain and vomiting   Associated symptoms: no fever   Flank Pain Associated symptoms include abdominal pain.    Past Medical History  Diagnosis Date  . Kidney stones   . Anemia    Past Surgical History  Procedure Laterality Date  . Cholecystectomy     History reviewed. No pertinent family history. History  Substance Use Topics  . Smoking status: Current Every Day Smoker    Types: Cigarettes  . Smokeless tobacco: Not on file  . Alcohol Use: No   OB History   Grav Para Term Preterm Abortions TAB SAB Ect Mult Living                 Review of Systems  Constitutional: Negative for fever.  Gastrointestinal: Positive for vomiting and abdominal pain.  Genitourinary: Positive for flank pain. Negative for vaginal bleeding and vaginal discharge.    Allergies  Compazine; Donnatal; Haldol; Latex; Lidocaine; Mushroom extract complex; Orange fruit; Other; Penicillins; Reglan; Toradol; Tramadol; and Zofran  Home  Medications   Current Outpatient Rx  Name  Route  Sig  Dispense  Refill  . promethazine (PHENERGAN) 25 MG tablet   Oral   Take 1 tablet (25 mg total) by mouth every 6 (six) hours as needed for nausea.   10 tablet   0    BP 107/62  Pulse 81  Temp(Src) 98.1 F (36.7 C) (Oral)  Resp 18  SpO2 98%  LMP 05/20/2013 Physical Exam  Nursing note and vitals reviewed. Constitutional: She is oriented to person, place, and time. She appears well-developed and well-nourished.  HENT:  Head: Normocephalic and atraumatic.  Eyes: Conjunctivae and EOM are normal. Pupils are equal, round, and reactive to light.  Neck: Normal range of motion. Neck supple.  Cardiovascular: Normal rate, regular rhythm, normal heart sounds and intact distal pulses.   No murmur heard. Pulmonary/Chest: Effort normal. No respiratory distress. She has no wheezes.  Abdominal: Soft. Bowel sounds are normal. She exhibits no distension. There is tenderness. There is no rebound and no guarding.  Genitourinary: Vagina normal and uterus normal.  External exam - normal, no lesions Speculum exam: Pt has some white discharge, no blood Bimanual exam: Patient has no CMT, no adnexal tenderness or fullness and cervical os is closed  Neurological: She is alert and oriented to person, place, and time.  Skin: Skin is warm and dry.    ED Course  Procedures (including critical care time) Labs Review Labs Reviewed  WET PREP, GENITAL - Abnormal; Notable for the following:  WBC, Wet Prep HPF POC FEW (*)    All other components within normal limits  URINALYSIS, ROUTINE W REFLEX MICROSCOPIC - Abnormal; Notable for the following:    APPearance CLOUDY (*)    Specific Gravity, Urine 1.033 (*)    All other components within normal limits  CBC WITH DIFFERENTIAL - Abnormal; Notable for the following:    HCT 35.1 (*)    Eosinophils Relative 6 (*)    All other components within normal limits  GC/CHLAMYDIA PROBE AMP  URINE CULTURE  BASIC  METABOLIC PANEL  MAGNESIUM  POCT PREGNANCY, URINE   Imaging Review No results found.  MDM   1. Chronic abdominal pain    Pt w/ hx of chronic abd pain comes in with cc of abd pain. Pt's abd and GU exam show tenderness, but no peritoneal signs. PO tolerated. Will d.c - no indication for imaging this visit.   Derwood Kaplan, MD 06/03/13 0151

## 2013-06-08 ENCOUNTER — Encounter (HOSPITAL_COMMUNITY): Payer: Self-pay | Admitting: Nurse Practitioner

## 2013-06-08 ENCOUNTER — Emergency Department (HOSPITAL_COMMUNITY): Payer: Medicare (Managed Care)

## 2013-06-08 ENCOUNTER — Emergency Department (HOSPITAL_COMMUNITY)
Admission: EM | Admit: 2013-06-08 | Discharge: 2013-06-09 | Disposition: A | Discharge status: Home / Self Care | Payer: Medicare (Managed Care) | Attending: Emergency Medicine | Admitting: Emergency Medicine

## 2013-06-08 DIAGNOSIS — M79609 Pain in unspecified limb: Secondary | ICD-10-CM | POA: Insufficient documentation

## 2013-06-08 DIAGNOSIS — F17200 Nicotine dependence, unspecified, uncomplicated: Secondary | ICD-10-CM | POA: Insufficient documentation

## 2013-06-08 DIAGNOSIS — Z880 Allergy status to penicillin: Secondary | ICD-10-CM | POA: Insufficient documentation

## 2013-06-08 DIAGNOSIS — R112 Nausea with vomiting, unspecified: Secondary | ICD-10-CM | POA: Insufficient documentation

## 2013-06-08 DIAGNOSIS — D649 Anemia, unspecified: Secondary | ICD-10-CM | POA: Insufficient documentation

## 2013-06-08 DIAGNOSIS — R109 Unspecified abdominal pain: Secondary | ICD-10-CM | POA: Insufficient documentation

## 2013-06-08 DIAGNOSIS — G8929 Other chronic pain: Secondary | ICD-10-CM

## 2013-06-08 DIAGNOSIS — Z888 Allergy status to other drugs, medicaments and biological substances status: Secondary | ICD-10-CM | POA: Insufficient documentation

## 2013-06-08 DIAGNOSIS — Z91040 Latex allergy status: Secondary | ICD-10-CM | POA: Insufficient documentation

## 2013-06-08 DIAGNOSIS — M549 Dorsalgia, unspecified: Secondary | ICD-10-CM | POA: Insufficient documentation

## 2013-06-08 DIAGNOSIS — Z87442 Personal history of urinary calculi: Secondary | ICD-10-CM | POA: Insufficient documentation

## 2013-06-08 LAB — BASIC METABOLIC PANEL
BUN: 13 mg/dL (ref 6–23)
CO2: 28 mEq/L (ref 19–32)
Calcium: 8.9 mg/dL (ref 8.4–10.5)
Chloride: 107 mEq/L (ref 96–112)
Creatinine, Ser: 0.75 mg/dL (ref 0.50–1.10)
GFR calc non Af Amer: >90 mL/min (ref >90)
Glucose, Bld: 70 mg/dL (ref 70–99)
Potassium: 4 mEq/L (ref 3.5–5.1)
Sodium: 143 mEq/L (ref 135–145)

## 2013-06-08 LAB — CBC
HCT: 34.3 % — ABNORMAL LOW (ref 36.0–46.0)
MCH: 31.1 pg (ref 26.0–34.0)
MCHC: 33.8 g/dL (ref 30.0–36.0)
MCV: 92 fL (ref 78.0–100.0)
Platelets: 205 10*3/uL (ref 150–400)
RBC: 3.73 MIL/uL — ABNORMAL LOW (ref 3.87–5.11)
RDW: 13.5 % (ref 11.5–15.5)

## 2013-06-08 LAB — PRO B NATRIURETIC PEPTIDE: Pro B Natriuretic peptide (BNP): 43.8 pg/mL (ref 0–125)

## 2013-06-08 LAB — POCT I-STAT TROPONIN I

## 2013-06-08 MED ORDER — SODIUM CHLORIDE 0.9 % IV BOLUS (SEPSIS)
1000.0000 mL | Freq: Once | INTRAVENOUS | Status: AC
  Administered 2013-06-08: 1000 mL via INTRAVENOUS

## 2013-06-08 MED ORDER — FENTANYL CITRATE 0.05 MG/ML IJ SOLN
25.0000 ug | INTRAMUSCULAR | Status: DC | PRN
  Administered 2013-06-08: 25 ug via INTRAVENOUS
  Filled 2013-06-08: qty 2

## 2013-06-08 MED ORDER — PROMETHAZINE HCL 25 MG/ML IJ SOLN
25.0000 mg | Freq: Once | INTRAMUSCULAR | Status: AC
  Administered 2013-06-08: 25 mg via INTRAVENOUS
  Filled 2013-06-08: qty 1

## 2013-06-08 NOTE — ED Notes (Signed)
Called pt twice to recheck vital signs no answer.

## 2013-06-08 NOTE — ED Notes (Signed)
C/o n/v, cp, sob, pain radiating from chest down R side of body into R leg x 4 days. States unable to tolerate any oral intake. Denies fevers, denies bowel/bladder changes

## 2013-06-08 NOTE — ED Notes (Signed)
DR. Dierdre Highman AT BEDSIDE EVALUATING PT.

## 2013-06-08 NOTE — ED Provider Notes (Signed)
CSN: [id number]     Arrival date & time 06/08/13  1733 History   First MD Initiated Contact with Patient 06/08/13 2256     Chief Complaint  Patient presents with  . Chest Pain   (Consider location/radiation/quality/duration/timing/severity/associated sxs/prior Treatment) HPI Hx per PT - L sided CP,upper ABD pain, R flank pain, and R leg pain onset 4 days ago with N/V states unable to hold anything down since this started. She is from [place], here visiting her grandmother, comes into the ER by herself. No F/C, no sick contacts.  She states she has pain all of the time, is followed by PCP in [place], takes hydrocodone at home when she is not vomiting. The only anti-emetic she can take is phenergan. She was evaluated here a week ago for the same complaints.  Pain is sharp and not radiating.  Past Medical History  Diagnosis Date  . Kidney stones   . Anemia    Past Surgical History  Procedure Laterality Date  . Cholecystectomy     History reviewed. No pertinent family history. History  Substance Use Topics  . Smoking status: Current Every Day Smoker    Types: Cigarettes  . Smokeless tobacco: Not on file  . Alcohol Use: No   OB History   Grav Para Term Preterm Abortions TAB SAB Ect Mult Living                 Review of Systems  Constitutional: Negative for fever and chills.  HENT: Negative for neck pain and neck stiffness.   Eyes: Negative for visual disturbance.  Respiratory: Negative for cough.   Cardiovascular: Positive for chest pain.  Gastrointestinal: Positive for vomiting and abdominal pain.  Genitourinary: Negative for dysuria.  Musculoskeletal: Positive for back pain.  Skin: Negative for rash.  Neurological: Negative for headaches.  All other systems reviewed and are negative.    Allergies  Compazine; Donnatal; Haldol; Latex; Lidocaine; Mushroom extract complex; Orange fruit; Other; Penicillins; Reglan; Toradol; Tramadol; and Zofran  Home Medications   Current  Outpatient Rx  Name  Route  Sig  Dispense  Refill  . promethazine (PHENERGAN) 25 MG tablet   Oral   Take 1 tablet (25 mg total) by mouth every 6 (six) hours as needed for nausea.   10 tablet   0    BP 107/58  Pulse 76  Temp(Src) 98.4 F (36.9 C) (Oral)  Resp 20  Ht 5\' 3"  (1.6 m)  Wt 148 lb (67.132 kg)  BMI 26.22 kg/m2  SpO2 97%  LMP 05/20/2013 Physical Exam  Constitutional: She is oriented to person, place, and time. She appears well-developed and well-nourished.  HENT:  Head: Normocephalic and atraumatic.  Eyes: EOM are normal. Pupils are equal, round, and reactive to light.  Neck: Neck supple.  Cardiovascular: Normal rate, regular rhythm and intact distal pulses.   Pulmonary/Chest: Effort normal and breath sounds normal. No respiratory distress.  Abdominal: Soft. Bowel sounds are normal. She exhibits no distension. There is no tenderness. There is no rebound and no guarding.  No CVAT, no tenderness with deep palpation throughout  Musculoskeletal: Normal range of motion. She exhibits no edema.  Neurological: She is alert and oriented to person, place, and time.  Skin: Skin is warm and dry.    ED Course  Procedures (including critical care time) Labs Review Labs Reviewed  CBC - Abnormal; Notable for the following:    RBC 3.73 (*)    Hemoglobin 11.6 (*)    HCT 34.3 (*)  All other components within normal limits  BASIC METABOLIC PANEL  PRO B NATRIURETIC PEPTIDE  URINALYSIS, ROUTINE W REFLEX MICROSCOPIC  POCT I-STAT TROPONIN I   Imaging Review Dg Chest 2 View  06/08/2013   CLINICAL DATA:  Chest pain.  EXAM: CHEST  2 VIEW  COMPARISON:  03/30/2013.  FINDINGS: The heart size and mediastinal contours are within normal limits. Both lungs are clear. The visualized skeletal structures are unremarkable.  IMPRESSION: No active cardiopulmonary disease.   Electronically Signed   By: Loralie Champagne M.D.   On: 06/08/2013 19:14   IV phenergan provided. IV fentanyl  On recheck  - patient sleeping with pain significantly improved. Tolerated oral fluid challenge without emesis or pain. Plan discharge home to keep followup appointment with primary care physician in 3 days in [place]. Rx Phenergan provided. Return precautions verbalized as understood.  MDM  DX: Chronic abdominal pain Imaging and labs obtained and reviewed as above Improved with IV narcotics and antiemetics Vital signs and nursing notes reviewed and considered    Sunnie Nielsen, MD 06/09/13 [phone]

## 2013-06-09 LAB — URINALYSIS, ROUTINE W REFLEX MICROSCOPIC
Bilirubin Urine: NEGATIVE
Glucose, UA: NEGATIVE mg/dL
Hgb urine dipstick: NEGATIVE
Ketones, ur: NEGATIVE mg/dL
Leukocytes, UA: NEGATIVE
Nitrite: NEGATIVE
Specific Gravity, Urine: 1.023 (ref 1.005–1.030)
pH: 7 (ref 5.0–8.0)

## 2013-06-09 MED ORDER — PROMETHAZINE HCL 25 MG PO TABS: 25.0000 mg | ORAL_TABLET | Freq: Four times a day (QID) | ORAL | Status: DC | PRN

## 2013-07-02 ENCOUNTER — Emergency Department (HOSPITAL_COMMUNITY): Payer: Medicare PPO

## 2013-07-02 ENCOUNTER — Encounter (HOSPITAL_COMMUNITY): Payer: Self-pay | Admitting: Emergency Medicine

## 2013-07-02 ENCOUNTER — Emergency Department (HOSPITAL_COMMUNITY)
Admission: EM | Admit: 2013-07-02 | Discharge: 2013-07-03 | Disposition: A | Discharge status: Home / Self Care | Payer: Medicare PPO | Attending: Emergency Medicine | Admitting: Emergency Medicine

## 2013-07-02 DIAGNOSIS — Z862 Personal history of diseases of the blood and blood-forming organs and certain disorders involving the immune mechanism: Secondary | ICD-10-CM | POA: Insufficient documentation

## 2013-07-02 DIAGNOSIS — R079 Chest pain, unspecified: Secondary | ICD-10-CM

## 2013-07-02 DIAGNOSIS — R109 Unspecified abdominal pain: Secondary | ICD-10-CM | POA: Insufficient documentation

## 2013-07-02 DIAGNOSIS — Z91040 Latex allergy status: Secondary | ICD-10-CM | POA: Insufficient documentation

## 2013-07-02 DIAGNOSIS — F17200 Nicotine dependence, unspecified, uncomplicated: Secondary | ICD-10-CM | POA: Insufficient documentation

## 2013-07-02 DIAGNOSIS — Z87442 Personal history of urinary calculi: Secondary | ICD-10-CM | POA: Insufficient documentation

## 2013-07-02 DIAGNOSIS — R0789 Other chest pain: Secondary | ICD-10-CM | POA: Insufficient documentation

## 2013-07-02 DIAGNOSIS — Z3202 Encounter for pregnancy test, result negative: Secondary | ICD-10-CM | POA: Insufficient documentation

## 2013-07-02 DIAGNOSIS — Z880 Allergy status to penicillin: Secondary | ICD-10-CM | POA: Insufficient documentation

## 2013-07-02 DIAGNOSIS — R1013 Epigastric pain: Secondary | ICD-10-CM | POA: Insufficient documentation

## 2013-07-02 DIAGNOSIS — R1110 Vomiting, unspecified: Secondary | ICD-10-CM | POA: Insufficient documentation

## 2013-07-02 LAB — URINALYSIS, ROUTINE W REFLEX MICROSCOPIC
Ketones, ur: 15 mg/dL — AB
Leukocytes, UA: NEGATIVE
Nitrite: NEGATIVE
Protein, ur: NEGATIVE mg/dL
Specific Gravity, Urine: 1.029 (ref 1.005–1.030)
Urobilinogen, UA: 0.2 mg/dL (ref 0.0–1.0)
pH: 5.5 (ref 5.0–8.0)

## 2013-07-02 LAB — BASIC METABOLIC PANEL
CO2: 25 mEq/L (ref 19–32)
Creatinine, Ser: 0.69 mg/dL (ref 0.50–1.10)
GFR calc Af Amer: >90 mL/min (ref >90)
GFR calc non Af Amer: >90 mL/min (ref >90)
Potassium: 3.6 mEq/L (ref 3.5–5.1)
Sodium: 140 mEq/L (ref 135–145)

## 2013-07-02 LAB — CBC
HCT: 34.8 % — ABNORMAL LOW (ref 36.0–46.0)
Hemoglobin: 11.8 g/dL — ABNORMAL LOW (ref 12.0–15.0)
MCH: 31.1 pg (ref 26.0–34.0)
MCHC: 33.9 g/dL (ref 30.0–36.0)
MCV: 91.6 fL (ref 78.0–100.0)
RBC: 3.8 MIL/uL — ABNORMAL LOW (ref 3.87–5.11)

## 2013-07-02 LAB — HEPATIC FUNCTION PANEL
ALT: 19 U/L (ref 0–35)
Alkaline Phosphatase: 74 U/L (ref 39–117)
Bilirubin, Direct: <0.1 mg/dL (ref 0.0–0.3)
Total Bilirubin: 0.2 mg/dL — ABNORMAL LOW (ref 0.3–1.2)

## 2013-07-02 LAB — POCT I-STAT TROPONIN I: Troponin i, poc: 0 ng/mL (ref 0.00–0.08)

## 2013-07-02 LAB — LACTIC ACID, PLASMA: Lactic Acid, Venous: 0.6 mmol/L (ref 0.5–2.2)

## 2013-07-02 MED ORDER — SODIUM CHLORIDE 0.9 % IV BOLUS (SEPSIS)
1000.0000 mL | Freq: Once | INTRAVENOUS | Status: AC
  Administered 2013-07-02: 1000 mL via INTRAVENOUS

## 2013-07-02 MED ORDER — DIPHENHYDRAMINE HCL 50 MG/ML IJ SOLN
25.0000 mg | Freq: Once | INTRAMUSCULAR | Status: AC
  Administered 2013-07-02: 25 mg via INTRAVENOUS
  Filled 2013-07-02: qty 1

## 2013-07-02 MED ORDER — GI COCKTAIL ~~LOC~~
30.0000 mL | Freq: Once | ORAL | Status: AC
  Administered 2013-07-02: 30 mL via ORAL
  Filled 2013-07-02: qty 30

## 2013-07-02 MED ORDER — METOCLOPRAMIDE HCL 5 MG/ML IJ SOLN
10.0000 mg | Freq: Once | INTRAMUSCULAR | Status: AC
  Administered 2013-07-02: 10 mg via INTRAVENOUS
  Filled 2013-07-02: qty 2

## 2013-07-02 NOTE — ED Notes (Signed)
Per Pt: Pt reports CP that radiates to L arm. Pt also c/o right sided abdominal pain with associated n/v. Pt reports all of these symptoms began about 5 days ago. Pt does reports some pain upon urination. Patient Ax4, NAD. Pt denies SOB.

## 2013-07-03 MED ORDER — PROMETHAZINE HCL 25 MG PO TABS: 25.0000 mg | ORAL_TABLET | Freq: Four times a day (QID) | ORAL | Status: DC | PRN

## 2013-07-03 MED ORDER — ALUM & MAG HYDROXIDE-SIMETH 200-200-20 MG/5ML PO SUSP: 15.0000 mL | Freq: Four times a day (QID) | ORAL | Status: DC | PRN

## 2013-07-03 NOTE — ED Provider Notes (Signed)
CSN: [id number]     Arrival date & time 07/02/13  2046 History   First MD Initiated Contact with Patient 07/02/13 2135     Chief Complaint  Patient presents with  . Chest Pain   (Consider location/radiation/quality/duration/timing/severity/associated sxs/prior Treatment) HPI Onset was 5 days ago, gradual, constant.  The pain is severe located to right flank, mid chest. Modifying factors: pain worse with emesis.  Associated symptoms: no fever, no cough.  Recent medical care: none.   Past Medical History  Diagnosis Date  . Kidney stones   . Anemia    Past Surgical History  Procedure Laterality Date  . Cholecystectomy     No family history on file. History  Substance Use Topics  . Smoking status: Current Every Day Smoker    Types: Cigarettes  . Smokeless tobacco: Not on file  . Alcohol Use: No   OB History   Grav Para Term Preterm Abortions TAB SAB Ect Mult Living                 Review of Systems Constitutional: Negative for fever.  Eyes: Negative for vision loss.  ENT: Negative for difficulty swallowing.  Cardiovascular: Positive for chest pain. Respiratory: Negative for respiratory distress.  Gastrointestinal:  Positive for vomiting.  Genitourinary: Negative for inability to void.  Musculoskeletal: Negative for gait problem.  Integumentary: Negative for rash.  Neurological: Negative for new focal weakness.     Allergies  Compazine; Donnatal; Haldol; Latex; Lidocaine; Mushroom extract complex; Orange fruit; Other; Penicillins; Reglan; Toradol; Tramadol; and Zofran  Home Medications   Current Outpatient Rx  Name  Route  Sig  Dispense  Refill  . alum & mag hydroxide-simeth (MAALOX ADVANCED) 200-200-20 MG/5ML suspension   Oral   Take 15 mLs by mouth every 6 (six) hours as needed for indigestion.   355 mL   0   . promethazine (PHENERGAN) 25 MG tablet   Oral   Take 1 tablet (25 mg total) by mouth every 6 (six) hours as needed for nausea.   30 tablet   0     BP 104/54  Pulse 75  SpO2 100%  LMP 06/15/2013 Physical Exam Nursing note and vitals reviewed.  Constitutional: Pt is alert and appears stated age. Eyes: No injection, no scleral icterus. HENT: Atraumatic, airway open without erythema or exudate.  Respiratory: No respiratory distress. Equal breathing bilaterally. Cardiovascular: Normal rate. Extremities warm and well perfused.  Abdomen: Soft, non-distended, right lateral flank pain, epigastric pain, no RLQ pain, no rebound or guarding. MSK: Extremities are atraumatic without deformity. Skin: No rash, no wounds.   Neuro: No motor nor sensory deficit.     ED Course  Procedures (including critical care time) Labs Review Labs Reviewed  CBC - Abnormal; Notable for the following:    RBC 3.80 (*)    Hemoglobin 11.8 (*)    HCT 34.8 (*)    All other components within normal limits  BASIC METABOLIC PANEL - Abnormal; Notable for the following:    Glucose, Bld 123 (*)    All other components within normal limits  URINALYSIS, ROUTINE W REFLEX MICROSCOPIC - Abnormal; Notable for the following:    APPearance CLOUDY (*)    Ketones, ur 15 (*)    All other components within normal limits  HEPATIC FUNCTION PANEL - Abnormal; Notable for the following:    Total Bilirubin 0.2 (*)    All other components within normal limits  LIPASE, BLOOD  LACTIC ACID, PLASMA  POCT I-STAT TROPONIN  I  POCT PREGNANCY, URINE   Imaging Review Ct Abdomen Pelvis Wo Contrast  07/02/2013   CLINICAL DATA:  Right flank pain and right lower quadrant abdominal pain. Nausea and vomiting. History kidney stones.  EXAM: CT ABDOMEN AND PELVIS WITHOUT CONTRAST  TECHNIQUE: Multidetector CT imaging of the abdomen and pelvis was performed following the standard protocol without intravenous contrast.  COMPARISON:  06/03/2013  FINDINGS: Minimal dependent changes in the lung bases.  The kidneys appear symmetrical in size and shape. No pyelocaliectasis or ureterectasis. No  renal, ureteral, or bladder stones.  Surgical absence of the gallbladder. The unenhanced appearance of the liver, spleen, pancreas, adrenal glands, abdominal aorta, inferior vena cava, and retroperitoneal lymph nodes is unremarkable. The stomach, small bowel, and colon are mostly decompressed. Stool fills the colon. No free air or free fluid in the abdomen. Abdominal wall musculature appears intact.  Pelvis: The appendix is normal. No free or loculated pelvic fluid collections. The uterus and ovaries are not enlarged. The the bladder is decompressed. No evidence of diverticulitis. No significant lymphadenopathy in the pelvis. Normal alignment of the lumbar spine. No destructive bone lesions appreciated. No significant changes since previous study.  IMPRESSION: No renal or ureteral stone or obstruction demonstrated.   Electronically Signed   By: Burman Nieves M.D.   On: 07/02/2013 22:44   Dg Chest 2 View  07/02/2013   CLINICAL DATA:  Abdominal pain and chest pain.  EXAM: CHEST  2 VIEW  COMPARISON:  06/08/2013  FINDINGS: The heart size and mediastinal contours are within normal limits. Both lungs are clear. The visualized skeletal structures are unremarkable.  IMPRESSION: No active cardiopulmonary disease.   Electronically Signed   By: Burman Nieves M.D.   On: 07/02/2013 23:43    EKG Interpretation   None       MDM   1. Abdominal pain   2. Chest pain   3. Emesis    27 y.o. female w/ PMHx of kidney stone, tobacco use presents w/ chest pain, abdominal pain, emesis. Looks well, normal vitals signs. Chart review shows pt with multiple visits for abdominal pain. Of note pt with multiple allergies but was given reglan, benadryl here without problems. U preg neg. UA without infection. CBC without anemia. EKG without signs of ischmia. CXR normal. Not c/w ACS, PE, or dissection. Chest pain likely secondary to irritation from emesis. Lipase without pancreatitis. CMP without renal or kidney failure.  Troponin drawn in triage neg. Lactate low. Pt states pt feels just like prior kidney stones so check CT stone study without abnormality. Pt doing well on re-eval. No narcotics given and will not d/c with narcotics. Plan for nausea control, pcp with f/u. Counseling provided regarding diagnosis, treatment plan, follow up recommendations, and return precautions. Questions answer       I independently viewed, interpreted, and used in my medical decision making all ordered lab and imaging tests. Medical Decision Making discussed with ED attending Dagmar Hait, MD      Charm Barges, MD 07/03/13 [phone]

## 2013-07-03 NOTE — ED Provider Notes (Addendum)
I saw and evaluated the patient, reviewed the resident's note and I agree with the findings and plan.  EKG Interpretation     Ventricular Rate:  81 PR Interval:  178 QRS Duration: 84 QT Interval:  354 QTC Calculation: 411 R Axis:   87 Text Interpretation:  Normal sinus rhythm with sinus arrhythmia Normal ECG ED PHYSICIAN INTERPRETATION AVAILABLE IN [facility]            EKG reviewed by me Patient here with multiple complaints. Frequently seen in the ED for multiple complaints. Concern for kidney stone, scan negative. Sleeping comfortably, stable for discharge.   Dagmar Hait, MD 07/03/13 1610  Dagmar Hait, MD 07/13/13 [phone]

## 2013-07-04 ENCOUNTER — Encounter (HOSPITAL_COMMUNITY): Payer: Self-pay | Admitting: Emergency Medicine

## 2013-07-04 ENCOUNTER — Emergency Department (HOSPITAL_COMMUNITY)
Admission: EM | Admit: 2013-07-04 | Discharge: 2013-07-04 | Disposition: A | Discharge status: Home / Self Care | Payer: Medicare Other | Attending: Emergency Medicine | Admitting: Emergency Medicine

## 2013-07-04 DIAGNOSIS — Z880 Allergy status to penicillin: Secondary | ICD-10-CM | POA: Insufficient documentation

## 2013-07-04 DIAGNOSIS — Z91040 Latex allergy status: Secondary | ICD-10-CM | POA: Insufficient documentation

## 2013-07-04 DIAGNOSIS — A499 Bacterial infection, unspecified: Secondary | ICD-10-CM | POA: Insufficient documentation

## 2013-07-04 DIAGNOSIS — R112 Nausea with vomiting, unspecified: Secondary | ICD-10-CM | POA: Insufficient documentation

## 2013-07-04 DIAGNOSIS — Z87442 Personal history of urinary calculi: Secondary | ICD-10-CM | POA: Insufficient documentation

## 2013-07-04 DIAGNOSIS — F17200 Nicotine dependence, unspecified, uncomplicated: Secondary | ICD-10-CM | POA: Insufficient documentation

## 2013-07-04 DIAGNOSIS — N760 Acute vaginitis: Secondary | ICD-10-CM | POA: Insufficient documentation

## 2013-07-04 DIAGNOSIS — B9689 Other specified bacterial agents as the cause of diseases classified elsewhere: Secondary | ICD-10-CM | POA: Insufficient documentation

## 2013-07-04 DIAGNOSIS — Z3202 Encounter for pregnancy test, result negative: Secondary | ICD-10-CM | POA: Insufficient documentation

## 2013-07-04 DIAGNOSIS — G8929 Other chronic pain: Secondary | ICD-10-CM | POA: Insufficient documentation

## 2013-07-04 DIAGNOSIS — Z862 Personal history of diseases of the blood and blood-forming organs and certain disorders involving the immune mechanism: Secondary | ICD-10-CM | POA: Insufficient documentation

## 2013-07-04 DIAGNOSIS — R300 Dysuria: Secondary | ICD-10-CM | POA: Insufficient documentation

## 2013-07-04 LAB — COMPREHENSIVE METABOLIC PANEL
Albumin: 3.8 g/dL (ref 3.5–5.2)
Alkaline Phosphatase: 74 U/L (ref 39–117)
BUN: 14 mg/dL (ref 6–23)
Calcium: 9.4 mg/dL (ref 8.4–10.5)
Creatinine, Ser: 0.73 mg/dL (ref 0.50–1.10)
GFR calc Af Amer: >90 mL/min (ref >90)
GFR calc non Af Amer: >90 mL/min (ref >90)
Glucose, Bld: 105 mg/dL — ABNORMAL HIGH (ref 70–99)
Total Protein: 7.2 g/dL (ref 6.0–8.3)

## 2013-07-04 LAB — CBC WITH DIFFERENTIAL/PLATELET
Basophils Absolute: 0 10*3/uL (ref 0.0–0.1)
HCT: 37.6 % (ref 36.0–46.0)
Hemoglobin: 12.9 g/dL (ref 12.0–15.0)
Lymphocytes Relative: 34 % (ref 12–46)
MCH: 31.8 pg (ref 26.0–34.0)
Monocytes Absolute: 0.6 10*3/uL (ref 0.1–1.0)
Neutro Abs: 2.8 10*3/uL (ref 1.7–7.7)
Neutrophils Relative %: 50 % (ref 43–77)
Platelets: 211 10*3/uL (ref 150–400)
RDW: 13.4 % (ref 11.5–15.5)
WBC: 5.6 10*3/uL (ref 4.0–10.5)

## 2013-07-04 LAB — WET PREP, GENITAL

## 2013-07-04 LAB — URINALYSIS, ROUTINE W REFLEX MICROSCOPIC
Hgb urine dipstick: NEGATIVE
Leukocytes, UA: NEGATIVE
Nitrite: NEGATIVE
Protein, ur: NEGATIVE mg/dL
Specific Gravity, Urine: 1.04 — ABNORMAL HIGH (ref 1.005–1.030)
Urobilinogen, UA: 0.2 mg/dL (ref 0.0–1.0)

## 2013-07-04 LAB — LIPASE, BLOOD: Lipase: 39 U/L (ref 11–59)

## 2013-07-04 MED ORDER — METRONIDAZOLE 500 MG PO TABS: 500.0000 mg | ORAL_TABLET | Freq: Two times a day (BID) | ORAL | Status: DC

## 2013-07-04 MED ORDER — PROMETHAZINE HCL 25 MG PO TABS
25.0000 mg | ORAL_TABLET | Freq: Once | ORAL | Status: AC
  Administered 2013-07-04: 25 mg via ORAL
  Filled 2013-07-04: qty 1

## 2013-07-04 MED ORDER — HYDROCODONE-ACETAMINOPHEN 5-325 MG PO TABS
1.0000 | ORAL_TABLET | Freq: Once | ORAL | Status: AC
  Administered 2013-07-04: 1 via ORAL
  Filled 2013-07-04: qty 1

## 2013-07-04 NOTE — ED Notes (Signed)
Pt c/o RLQ abdominal pain x 1 week. Described as sharp shooting throbbing type pain, with N/V x 5 in 24 hrs. Burning during urination. LBM 2 days ago. Left x 3 weeks, and denies trauma.

## 2013-07-04 NOTE — ED Provider Notes (Signed)
CSN: [id number]     Arrival date & time 07/04/13  2134 History   First MD Initiated Contact with Patient 07/04/13 2235     Chief Complaint  Patient presents with  . Abdominal Pain   HPI  History provided by the patient in recent medical charts. Patient is a 27 year old female with history of kidney stones, cholecystectomy and chronic abdominal pains returns to the emergency room with complaints of continued lower abdominal pain with nausea vomiting. Patient was seen 2 days ago with similar symptoms. She had normal CT scan and lab testing. Today she reports continued episodes of nausea vomiting for the past one week. She also complains of slight dysuria that began 3 days ago and foul-smelling vaginal discharge. She denies any dyspareunia or vaginal bleeding. Denies any abnormal menstrual cycle. She denies any diarrhea or constipation. She has taken ibuprofen at home without any relief of symptoms. Denies any other aggravating or alleviating factors. No associated fever, chills or sweats.    Past Medical History  Diagnosis Date  . Kidney stones   . Anemia    Past Surgical History  Procedure Laterality Date  . Cholecystectomy     No family history on file. History  Substance Use Topics  . Smoking status: Current Every Day Smoker    Types: Cigarettes  . Smokeless tobacco: Not on file  . Alcohol Use: No   OB History   Grav Para Term Preterm Abortions TAB SAB Ect Mult Living                 Review of Systems  Constitutional: Negative for fever, chills and diaphoresis.  Gastrointestinal: Positive for nausea, vomiting and abdominal pain. Negative for diarrhea and constipation.  Genitourinary: Positive for dysuria and vaginal discharge. Negative for frequency, hematuria, flank pain, vaginal bleeding, menstrual problem and dyspareunia.  All other systems reviewed and are negative.    Allergies  Compazine; Donnatal; Haldol; Latex; Lidocaine; Mushroom extract complex; Orange fruit;  Other; Penicillins; Reglan; Toradol; Tramadol; and Zofran  Home Medications   Current Outpatient Rx  Name  Route  Sig  Dispense  Refill  . ibuprofen (ADVIL,MOTRIN) 200 MG tablet   Oral   Take 200 mg by mouth every 6 (six) hours as needed for pain (pain).          BP 124/73  Pulse 86  Temp(Src) 98.1 F (36.7 C) (Oral)  Resp 18  Wt 156 lb 14.4 oz (71.169 kg)  BMI 27.8 kg/m2  SpO2 97%  LMP 06/15/2013 Physical Exam  Nursing note and vitals reviewed. Constitutional: She is oriented to person, place, and time. She appears well-developed and well-nourished. No distress.  HENT:  Head: Normocephalic.  Cardiovascular: Normal rate and regular rhythm.   Pulmonary/Chest: Effort normal and breath sounds normal.  Abdominal: Soft. She exhibits no distension. There is tenderness in the suprapubic area. There is no rebound and no guarding.  Mild suprapubic tenderness.  Neurological: She is alert and oriented to person, place, and time.  Skin: Skin is warm and dry. No rash noted.  Psychiatric: She has a normal mood and affect. Her behavior is normal.    ED Course  Procedures       COORDINATION OF CARE:  Nursing notes reviewed. Vital signs reviewed. Initial pt interview and examination performed.    Patient was seen and evaluated for similar chronic pains one day ago. She had a normal CT scan. Labs have not changed. Patient also had negative urine pregnancy and unremarkable UA at  that time. No significant change in her symptoms today. Lab testing without any concern change. Some signs of possible BV, will treat with Flagyl.  At this time she may be discharged home to followup with PCP for continued care of chronic symptoms.   Results for orders placed during the hospital encounter of 07/04/13  WET PREP, GENITAL      Result Value Range   Yeast Wet Prep HPF POC NONE SEEN  NONE SEEN   Trich, Wet Prep NONE SEEN  NONE SEEN   Clue Cells Wet Prep HPF POC FEW (*) NONE SEEN   WBC, Wet Prep  HPF POC FEW (*) NONE SEEN  COMPREHENSIVE METABOLIC PANEL      Result Value Range   Sodium 140  135 - 145 mEq/L   Potassium 4.1  3.5 - 5.1 mEq/L   Chloride 105  96 - 112 mEq/L   CO2 25  19 - 32 mEq/L   Glucose, Bld 105 (*) 70 - 99 mg/dL   BUN 14  6 - 23 mg/dL   Creatinine, Ser 4.54  0.50 - 1.10 mg/dL   Calcium 9.4  8.4 - 09.8 mg/dL   Total Protein 7.2  6.0 - 8.3 g/dL   Albumin 3.8  3.5 - 5.2 g/dL   AST 26  0 - 37 U/L   ALT 17  0 - 35 U/L   Alkaline Phosphatase 74  39 - 117 U/L   Total Bilirubin 0.2 (*) 0.3 - 1.2 mg/dL   GFR calc non Af Amer >90  >90 mL/min   GFR calc Af Amer >90  >90 mL/min  CBC WITH DIFFERENTIAL      Result Value Range   WBC 5.6  4.0 - 10.5 K/uL   RBC 4.06  3.87 - 5.11 MIL/uL   Hemoglobin 12.9  12.0 - 15.0 g/dL   HCT 11.9  14.7 - 82.9 %   MCV 92.6  78.0 - 100.0 fL   MCH 31.8  26.0 - 34.0 pg   MCHC 34.3  30.0 - 36.0 g/dL   RDW 56.2  13.0 - 86.5 %   Platelets 211  150 - 400 K/uL   Neutrophils Relative % 50  43 - 77 %   Neutro Abs 2.8  1.7 - 7.7 K/uL   Lymphocytes Relative 34  12 - 46 %   Lymphs Abs 1.9  0.7 - 4.0 K/uL   Monocytes Relative 10  3 - 12 %   Monocytes Absolute 0.6  0.1 - 1.0 K/uL   Eosinophils Relative 6 (*) 0 - 5 %   Eosinophils Absolute 0.4  0.0 - 0.7 K/uL   Basophils Relative 0  0 - 1 %   Basophils Absolute 0.0  0.0 - 0.1 K/uL  LIPASE, BLOOD      Result Value Range   Lipase 39  11 - 59 U/L  URINALYSIS, ROUTINE W REFLEX MICROSCOPIC      Result Value Range   Color, Urine YELLOW  YELLOW   APPearance CLOUDY (*) CLEAR   Specific Gravity, Urine 1.040 (*) 1.005 - 1.030   pH 7.0  5.0 - 8.0   Glucose, UA NEGATIVE  NEGATIVE mg/dL   Hgb urine dipstick NEGATIVE  NEGATIVE   Bilirubin Urine NEGATIVE  NEGATIVE   Ketones, ur NEGATIVE  NEGATIVE mg/dL   Protein, ur NEGATIVE  NEGATIVE mg/dL   Urobilinogen, UA 0.2  0.0 - 1.0 mg/dL   Nitrite NEGATIVE  NEGATIVE   Leukocytes, UA NEGATIVE  NEGATIVE  POCT  PREGNANCY, URINE      Result Value Range   Preg  Test, Ur NEGATIVE  NEGATIVE       EKG Interpretation   None       MDM   1. BV (bacterial vaginosis)   2. Chronic abdominal pain          Angus Seller, PA-C 07/05/13 2032

## 2013-07-05 ENCOUNTER — Encounter (HOSPITAL_COMMUNITY): Payer: Self-pay | Admitting: Emergency Medicine

## 2013-07-05 ENCOUNTER — Emergency Department (HOSPITAL_COMMUNITY)
Admission: EM | Admit: 2013-07-05 | Discharge: 2013-07-06 | Disposition: A | Discharge status: Home / Self Care | Payer: Medicare Other | Attending: Emergency Medicine | Admitting: Emergency Medicine

## 2013-07-05 DIAGNOSIS — Z791 Long term (current) use of non-steroidal anti-inflammatories (NSAID): Secondary | ICD-10-CM | POA: Insufficient documentation

## 2013-07-05 DIAGNOSIS — F17200 Nicotine dependence, unspecified, uncomplicated: Secondary | ICD-10-CM | POA: Insufficient documentation

## 2013-07-05 DIAGNOSIS — G8929 Other chronic pain: Secondary | ICD-10-CM | POA: Insufficient documentation

## 2013-07-05 DIAGNOSIS — R300 Dysuria: Secondary | ICD-10-CM | POA: Insufficient documentation

## 2013-07-05 DIAGNOSIS — Z862 Personal history of diseases of the blood and blood-forming organs and certain disorders involving the immune mechanism: Secondary | ICD-10-CM | POA: Insufficient documentation

## 2013-07-05 DIAGNOSIS — Z792 Long term (current) use of antibiotics: Secondary | ICD-10-CM | POA: Insufficient documentation

## 2013-07-05 DIAGNOSIS — Z91040 Latex allergy status: Secondary | ICD-10-CM | POA: Insufficient documentation

## 2013-07-05 DIAGNOSIS — Z87442 Personal history of urinary calculi: Secondary | ICD-10-CM | POA: Insufficient documentation

## 2013-07-05 DIAGNOSIS — R109 Unspecified abdominal pain: Secondary | ICD-10-CM

## 2013-07-05 HISTORY — DX: Unspecified abdominal pain: R10.9

## 2013-07-05 HISTORY — DX: Other chronic pain: G89.29

## 2013-07-05 NOTE — ED Notes (Signed)
C/o RLQ pain x 1 week.  Seen in ED yesterday for same and states Ibuprofen is not helping pain.  Reports nausea.  Denies vomiting/diarrhea.  Also reports pain with urination.

## 2013-07-05 NOTE — ED Provider Notes (Signed)
Medical screening examination/treatment/procedure(s) were performed by non-physician practitioner and as supervising physician I was immediately available for consultation/collaboration.  Monroe Qin B. Hollan Philipp, MD 07/05/13 2249 

## 2013-07-06 ENCOUNTER — Encounter (HOSPITAL_COMMUNITY): Payer: Self-pay | Admitting: Emergency Medicine

## 2013-07-06 LAB — GC/CHLAMYDIA PROBE AMP
CT Probe RNA: NEGATIVE
GC Probe RNA: NEGATIVE

## 2013-07-06 MED ORDER — DICYCLOMINE HCL 10 MG/ML IM SOLN
20.0000 mg | Freq: Once | INTRAMUSCULAR | Status: AC
  Administered 2013-07-06: 20 mg via INTRAMUSCULAR
  Filled 2013-07-06: qty 2

## 2013-07-06 MED ORDER — NAPROXEN 500 MG PO TABS: 500.0000 mg | ORAL_TABLET | Freq: Two times a day (BID) | ORAL | Status: DC

## 2013-07-06 NOTE — ED Notes (Signed)
Pt states understanding of discharge instructions 

## 2013-07-06 NOTE — ED Provider Notes (Signed)
CSN: [id number]     Arrival date & time 07/05/13  2214 History   First MD Initiated Contact with Patient 07/06/13 0144     Chief Complaint  Patient presents with  . Abdominal Pain   (Consider location/radiation/quality/duration/timing/severity/associated sxs/prior Treatment) HPI 27 year old female presents to emergency room with complaint of persistent right flank.  Pain.  Patient was seen in the emergency room yesterday with normal labs, reports that the ibuprofen recommended to her is not helping.  Her pain.  She reports dysuria.  Patient had CT scan within the week, no specific findings.  Pelvic done last night, with possible BV, treated for same.  No fevers no chills.  No diarrhea.  Normal bowel movements.  She reports that she has followup this week with a primary care Dr., but was uncomfortable tonight.  Patient was sleeping upon my arrival to her room, and reports pain has eased off some. Past Medical History  Diagnosis Date  . Kidney stones   . Anemia   . Chronic abdominal pain    Past Surgical History  Procedure Laterality Date  . Cholecystectomy     No family history on file. History  Substance Use Topics  . Smoking status: Current Every Day Smoker    Types: Cigarettes  . Smokeless tobacco: Not on file  . Alcohol Use: No   OB History   Grav Para Term Preterm Abortions TAB SAB Ect Mult Living                 Review of Systems  See History of Present Illness; otherwise all other systems are reviewed and negative  Allergies  Compazine; Donnatal; Haldol; Latex; Lidocaine; Maalox; Mushroom extract complex; Orange fruit; Other; Penicillins; Reglan; Toradol; Tramadol; and Zofran  Home Medications   Current Outpatient Rx  Name  Route  Sig  Dispense  Refill  . acetaminophen (TYLENOL) 500 MG tablet   Oral   Take 1,000 mg by mouth every 6 (six) hours as needed for pain.         Marland Kitchen HYDROcodone-acetaminophen (NORCO/VICODIN) 5-325 MG per tablet   Oral   Take 1 tablet by  mouth every 6 (six) hours as needed for pain.         . metroNIDAZOLE (FLAGYL) 500 MG tablet   Oral   Take 1 tablet (500 mg total) by mouth 2 (two) times daily.   14 tablet   0   . naproxen (NAPROSYN) 500 MG tablet   Oral   Take 1 tablet (500 mg total) by mouth 2 (two) times daily.   30 tablet   0    BP 108/64  Pulse 74  Temp(Src) 97.8 F (36.6 C) (Oral)  Resp 16  SpO2 100%  LMP 06/15/2013 Physical Exam  Constitutional: She is oriented to person, place, and time. She appears well-developed and well-nourished.  HENT:  Head: Normocephalic and atraumatic.  Right Ear: External ear normal.  Left Ear: External ear normal.  Nose: Nose normal.  Mouth/Throat: Oropharynx is clear and moist.  Eyes: Conjunctivae and EOM are normal. Pupils are equal, round, and reactive to light.  Neck: Normal range of motion. Neck supple. No JVD present. No tracheal deviation present. No thyromegaly present.  Cardiovascular: Normal rate, regular rhythm, normal heart sounds and intact distal pulses.  Exam reveals no gallop and no friction rub.   No murmur heard. Pulmonary/Chest: Effort normal and breath sounds normal. No stridor. No respiratory distress. She has no wheezes. She has no rales. She exhibits no  tenderness.  Abdominal: Soft. Bowel sounds are normal. She exhibits no distension and no mass. There is tenderness (tenderness along right flank in the mid axillary line.  No anterior or posterior pain on exam). There is no rebound and no guarding.  Musculoskeletal: Normal range of motion. She exhibits no edema and no tenderness.  Lymphadenopathy:    She has no cervical adenopathy.  Neurological: She is alert and oriented to person, place, and time. She exhibits normal muscle tone. Coordination normal.  Skin: Skin is warm and dry. No rash noted. No erythema. No pallor.  Psychiatric: She has a normal mood and affect. Her behavior is normal. Judgment and thought content normal.    ED Course   Procedures (including critical care time) Labs Review Labs Reviewed - No data to display Imaging Review No results found.  Results for orders placed during the hospital encounter of 07/04/13  GC/CHLAMYDIA PROBE AMP      Result Value Range   CT Probe RNA NEGATIVE  NEGATIVE   GC Probe RNA NEGATIVE  NEGATIVE  WET PREP, GENITAL      Result Value Range   Yeast Wet Prep HPF POC NONE SEEN  NONE SEEN   Trich, Wet Prep NONE SEEN  NONE SEEN   Clue Cells Wet Prep HPF POC FEW (*) NONE SEEN   WBC, Wet Prep HPF POC FEW (*) NONE SEEN  COMPREHENSIVE METABOLIC PANEL      Result Value Range   Sodium 140  135 - 145 mEq/L   Potassium 4.1  3.5 - 5.1 mEq/L   Chloride 105  96 - 112 mEq/L   CO2 25  19 - 32 mEq/L   Glucose, Bld 105 (*) 70 - 99 mg/dL   BUN 14  6 - 23 mg/dL   Creatinine, Ser 4.09  0.50 - 1.10 mg/dL   Calcium 9.4  8.4 - 81.1 mg/dL   Total Protein 7.2  6.0 - 8.3 g/dL   Albumin 3.8  3.5 - 5.2 g/dL   AST 26  0 - 37 U/L   ALT 17  0 - 35 U/L   Alkaline Phosphatase 74  39 - 117 U/L   Total Bilirubin 0.2 (*) 0.3 - 1.2 mg/dL   GFR calc non Af Amer >90  >90 mL/min   GFR calc Af Amer >90  >90 mL/min  CBC WITH DIFFERENTIAL      Result Value Range   WBC 5.6  4.0 - 10.5 K/uL   RBC 4.06  3.87 - 5.11 MIL/uL   Hemoglobin 12.9  12.0 - 15.0 g/dL   HCT 91.4  78.2 - 95.6 %   MCV 92.6  78.0 - 100.0 fL   MCH 31.8  26.0 - 34.0 pg   MCHC 34.3  30.0 - 36.0 g/dL   RDW 21.3  08.6 - 57.8 %   Platelets 211  150 - 400 K/uL   Neutrophils Relative % 50  43 - 77 %   Neutro Abs 2.8  1.7 - 7.7 K/uL   Lymphocytes Relative 34  12 - 46 %   Lymphs Abs 1.9  0.7 - 4.0 K/uL   Monocytes Relative 10  3 - 12 %   Monocytes Absolute 0.6  0.1 - 1.0 K/uL   Eosinophils Relative 6 (*) 0 - 5 %   Eosinophils Absolute 0.4  0.0 - 0.7 K/uL   Basophils Relative 0  0 - 1 %   Basophils Absolute 0.0  0.0 - 0.1 K/uL  LIPASE, BLOOD  Result Value Range   Lipase 39  11 - 59 U/L  URINALYSIS, ROUTINE W REFLEX MICROSCOPIC       Result Value Range   Color, Urine YELLOW  YELLOW   APPearance CLOUDY (*) CLEAR   Specific Gravity, Urine 1.040 (*) 1.005 - 1.030   pH 7.0  5.0 - 8.0   Glucose, UA NEGATIVE  NEGATIVE mg/dL   Hgb urine dipstick NEGATIVE  NEGATIVE   Bilirubin Urine NEGATIVE  NEGATIVE   Ketones, ur NEGATIVE  NEGATIVE mg/dL   Protein, ur NEGATIVE  NEGATIVE mg/dL   Urobilinogen, UA 0.2  0.0 - 1.0 mg/dL   Nitrite NEGATIVE  NEGATIVE   Leukocytes, UA NEGATIVE  NEGATIVE  POCT PREGNANCY, URINE      Result Value Range   Preg Test, Ur NEGATIVE  NEGATIVE   Ct Abdomen Pelvis Wo Contrast  07/02/2013   CLINICAL DATA:  Right flank pain and right lower quadrant abdominal pain. Nausea and vomiting. History kidney stones.  EXAM: CT ABDOMEN AND PELVIS WITHOUT CONTRAST  TECHNIQUE: Multidetector CT imaging of the abdomen and pelvis was performed following the standard protocol without intravenous contrast.  COMPARISON:  06/03/2013  FINDINGS: Minimal dependent changes in the lung bases.  The kidneys appear symmetrical in size and shape. No pyelocaliectasis or ureterectasis. No renal, ureteral, or bladder stones.  Surgical absence of the gallbladder. The unenhanced appearance of the liver, spleen, pancreas, adrenal glands, abdominal aorta, inferior vena cava, and retroperitoneal lymph nodes is unremarkable. The stomach, small bowel, and colon are mostly decompressed. Stool fills the colon. No free air or free fluid in the abdomen. Abdominal wall musculature appears intact.  Pelvis: The appendix is normal. No free or loculated pelvic fluid collections. The uterus and ovaries are not enlarged. The the bladder is decompressed. No evidence of diverticulitis. No significant lymphadenopathy in the pelvis. Normal alignment of the lumbar spine. No destructive bone lesions appreciated. No significant changes since previous study.  IMPRESSION: No renal or ureteral stone or obstruction demonstrated.   Electronically Signed   By: Burman Nieves M.D.    On: 07/02/2013 22:44   Dg Chest 2 View  07/02/2013   CLINICAL DATA:  Abdominal pain and chest pain.  EXAM: CHEST  2 VIEW  COMPARISON:  06/08/2013  FINDINGS: The heart size and mediastinal contours are within normal limits. Both lungs are clear. The visualized skeletal structures are unremarkable.  IMPRESSION: No active cardiopulmonary disease.   Electronically Signed   By: Burman Nieves M.D.   On: 07/02/2013 23:43   Dg Chest 2 View  06/08/2013   CLINICAL DATA:  Chest pain.  EXAM: CHEST  2 VIEW  COMPARISON:  03/30/2013.  FINDINGS: The heart size and mediastinal contours are within normal limits. Both lungs are clear. The visualized skeletal structures are unremarkable.  IMPRESSION: No active cardiopulmonary disease.   Electronically Signed   By: Loralie Champagne M.D.   On: 06/08/2013 19:14      MDM   1. Right flank pain    27 yo female with chronic right flank pain, seen frequently for same.  Labs yesterday normal, ct scan 3 days ago negative.  Pt has f/u scheduled.  Will d/c home.    Olivia Mackie, MD 07/07/13 [phone]

## 2013-07-06 NOTE — ED Notes (Signed)
Patient asked for and received a happy meal, crackers and a sprite.

## 2013-07-06 NOTE — ED Notes (Signed)
Otter MD at bedside. 

## 2013-07-10 ENCOUNTER — Emergency Department (HOSPITAL_COMMUNITY): Payer: Medicare Other

## 2013-07-10 ENCOUNTER — Encounter (HOSPITAL_COMMUNITY): Payer: Self-pay | Admitting: Emergency Medicine

## 2013-07-10 ENCOUNTER — Emergency Department (HOSPITAL_COMMUNITY)
Admission: EM | Admit: 2013-07-10 | Discharge: 2013-07-10 | Disposition: A | Discharge status: Home / Self Care | Payer: Medicare Other | Attending: Emergency Medicine | Admitting: Emergency Medicine

## 2013-07-10 DIAGNOSIS — R1011 Right upper quadrant pain: Secondary | ICD-10-CM | POA: Insufficient documentation

## 2013-07-10 DIAGNOSIS — F17200 Nicotine dependence, unspecified, uncomplicated: Secondary | ICD-10-CM | POA: Insufficient documentation

## 2013-07-10 DIAGNOSIS — R109 Unspecified abdominal pain: Secondary | ICD-10-CM | POA: Insufficient documentation

## 2013-07-10 DIAGNOSIS — R079 Chest pain, unspecified: Secondary | ICD-10-CM

## 2013-07-10 DIAGNOSIS — R1031 Right lower quadrant pain: Secondary | ICD-10-CM | POA: Insufficient documentation

## 2013-07-10 DIAGNOSIS — G8929 Other chronic pain: Secondary | ICD-10-CM | POA: Insufficient documentation

## 2013-07-10 DIAGNOSIS — N760 Acute vaginitis: Secondary | ICD-10-CM | POA: Insufficient documentation

## 2013-07-10 DIAGNOSIS — Z862 Personal history of diseases of the blood and blood-forming organs and certain disorders involving the immune mechanism: Secondary | ICD-10-CM | POA: Insufficient documentation

## 2013-07-10 DIAGNOSIS — B9689 Other specified bacterial agents as the cause of diseases classified elsewhere: Secondary | ICD-10-CM

## 2013-07-10 DIAGNOSIS — Z3202 Encounter for pregnancy test, result negative: Secondary | ICD-10-CM | POA: Insufficient documentation

## 2013-07-10 DIAGNOSIS — A499 Bacterial infection, unspecified: Secondary | ICD-10-CM | POA: Insufficient documentation

## 2013-07-10 DIAGNOSIS — Z880 Allergy status to penicillin: Secondary | ICD-10-CM | POA: Insufficient documentation

## 2013-07-10 DIAGNOSIS — Z91040 Latex allergy status: Secondary | ICD-10-CM | POA: Insufficient documentation

## 2013-07-10 DIAGNOSIS — R1110 Vomiting, unspecified: Secondary | ICD-10-CM | POA: Insufficient documentation

## 2013-07-10 DIAGNOSIS — Z87442 Personal history of urinary calculi: Secondary | ICD-10-CM | POA: Insufficient documentation

## 2013-07-10 LAB — COMPREHENSIVE METABOLIC PANEL
ALT: 16 U/L (ref 0–35)
AST: 27 U/L (ref 0–37)
Albumin: 4.2 g/dL (ref 3.5–5.2)
Alkaline Phosphatase: 75 U/L (ref 39–117)
Chloride: 99 mEq/L (ref 96–112)
Creatinine, Ser: 0.67 mg/dL (ref 0.50–1.10)
Potassium: 3.8 mEq/L (ref 3.5–5.1)
Sodium: 137 mEq/L (ref 135–145)
Total Bilirubin: 0.2 mg/dL — ABNORMAL LOW (ref 0.3–1.2)
Total Protein: 7.8 g/dL (ref 6.0–8.3)

## 2013-07-10 LAB — URINALYSIS, ROUTINE W REFLEX MICROSCOPIC
Ketones, ur: NEGATIVE mg/dL
Nitrite: NEGATIVE
Protein, ur: NEGATIVE mg/dL
Specific Gravity, Urine: 1.017 (ref 1.005–1.030)
pH: 7 (ref 5.0–8.0)

## 2013-07-10 LAB — WET PREP, GENITAL: Yeast Wet Prep HPF POC: NONE SEEN

## 2013-07-10 LAB — CBC WITH DIFFERENTIAL/PLATELET
Basophils Absolute: 0 10*3/uL (ref 0.0–0.1)
Basophils Relative: 1 % (ref 0–1)
Eosinophils Absolute: 0.3 10*3/uL (ref 0.0–0.7)
Lymphocytes Relative: 31 % (ref 12–46)
MCHC: 33.8 g/dL (ref 30.0–36.0)
Monocytes Absolute: 0.5 10*3/uL (ref 0.1–1.0)
Neutro Abs: 2.8 10*3/uL (ref 1.7–7.7)
Neutrophils Relative %: 54 % (ref 43–77)
RDW: 13.3 % (ref 11.5–15.5)
WBC: 5.2 10*3/uL (ref 4.0–10.5)

## 2013-07-10 LAB — URINE MICROSCOPIC-ADD ON

## 2013-07-10 LAB — LIPASE, BLOOD: Lipase: 42 U/L (ref 11–59)

## 2013-07-10 LAB — POCT PREGNANCY, URINE: Preg Test, Ur: NEGATIVE

## 2013-07-10 MED ORDER — SODIUM CHLORIDE 0.9 % IV BOLUS (SEPSIS)
1000.0000 mL | Freq: Once | INTRAVENOUS | Status: AC
  Administered 2013-07-10: 1000 mL via INTRAVENOUS

## 2013-07-10 MED ORDER — DICYCLOMINE HCL 10 MG PO CAPS: 10.0000 mg | ORAL_CAPSULE | Freq: Once | ORAL | Status: DC

## 2013-07-10 MED ORDER — METRONIDAZOLE 500 MG PO TABS: 500.0000 mg | ORAL_TABLET | Freq: Two times a day (BID) | ORAL | Status: DC

## 2013-07-10 MED ORDER — PROMETHAZINE HCL 25 MG/ML IJ SOLN
25.0000 mg | Freq: Once | INTRAMUSCULAR | Status: AC
  Administered 2013-07-10: 25 mg via INTRAVENOUS
  Filled 2013-07-10: qty 1

## 2013-07-10 MED ORDER — DIPHENHYDRAMINE HCL 25 MG PO CAPS
50.0000 mg | ORAL_CAPSULE | Freq: Once | ORAL | Status: AC
  Administered 2013-07-10: 50 mg via ORAL
  Filled 2013-07-10: qty 2

## 2013-07-10 MED ORDER — KETOROLAC TROMETHAMINE 30 MG/ML IJ SOLN
30.0000 mg | Freq: Once | INTRAMUSCULAR | Status: AC
  Administered 2013-07-10: 30 mg via INTRAVENOUS
  Filled 2013-07-10: qty 1

## 2013-07-10 NOTE — ED Notes (Signed)
Patient is out the room for the moment. And will get urine specimen when she returns

## 2013-07-10 NOTE — ED Notes (Signed)
Per pt has had n/v, RUQ abd pain, and right sided chest pain x5 days. Pt states not able to keep fluids down. Pt reports unrelieved pain by motrin and tylenol. Pt reports dizziness and lightheadedness.

## 2013-07-10 NOTE — ED Provider Notes (Signed)
TIME SEEN: 6:03 PM  CHIEF COMPLAINT: Abdominal pain, chest pain  HPI: Patient is a 27 year old female with history of kidney stones who presents emergency department with complaints of right-sided chest pain, right upper and lower quadrant abdominal pain, vomiting and has been present for the past week. She's been seen in the emergency department several times as week for the same with negative workup. She denies any fevers or chills. She's not had any diarrhea. No shortness of breath. No cough. Describes her pain as sharp and achy without radiation. No aggravating or alleviating factors. She also states that she has been lightheaded. She is also complaining of foul-smelling urine and vaginal discharge.  She denies a prior history of PE or DVT, no lower extremity swelling or pain, exogenous hormone use, no prolonged immobilization, fracture, surgery, trauma. No history of hypertension, diabetes or hyperlipidemia.  On 07/02/13, patient had a normal noncontrast abdominal CT. She's also had negative gonorrhea and Chlamydia cultures.  ROS: See HPI Constitutional: no fever  Eyes: no drainage  ENT: no runny nose   Cardiovascular:   chest pain  Resp: no SOB  GI: no vomiting GU: no dysuria Integumentary: no rash  Allergy: no hives  Musculoskeletal: no leg swelling  Neurological: no slurred speech ROS otherwise negative  PAST MEDICAL HISTORY/PAST SURGICAL HISTORY:  Past Medical History  Diagnosis Date  . Kidney stones   . Anemia   . Chronic abdominal pain     MEDICATIONS:  Prior to Admission medications   Not on File    ALLERGIES:  Allergies  Allergen Reactions  . Compazine [Prochlorperazine] Rash  . Donnatal [Belladonna Alk-Phenobarb Er] Itching and Rash  . Haldol [Haloperidol] Rash  . Latex Rash  . Lidocaine Itching and Rash  . Maalox [Calcium Carbonate Antacid] Rash  . Mushroom Extract Complex Hives and Rash  . Orange Fruit [Citrus] Rash  . Other Hives, Itching and Rash   Medication:GI Cocktail  . Penicillins Hives and Rash  . Reglan [Metoclopramide] Hives  . Toradol [Ketorolac Tromethamine] Rash  . Tramadol Rash  . Zofran [Ondansetron Hcl] Hives    SOCIAL HISTORY:  History  Substance Use Topics  . Smoking status: Current Every Day Smoker    Types: Cigarettes  . Smokeless tobacco: Not on file  . Alcohol Use: No    FAMILY HISTORY: No family history on file.  EXAM: BP 124/72  Temp(Src) 98.4 F (36.9 C) (Oral)  Resp 16  Ht 5\' 3"  (1.6 m)  Wt 156 lb (70.761 kg)  BMI 27.64 kg/m2  SpO2 100%  LMP 06/15/2013 CONSTITUTIONAL: Alert and oriented and responds appropriately to questions. Well-appearing; well-nourished HEAD: Normocephalic EYES: Conjunctivae clear, PERRL ENT: normal nose; no rhinorrhea; moist mucous membranes; pharynx without lesions noted NECK: Supple, no meningismus, no LAD  CARD: RRR; S1 and S2 appreciated; no murmurs, no clicks, no rubs, no gallops RESP: Normal chest excursion without splinting or tachypnea; breath sounds clear and equal bilaterally; no wheezes, no rhonchi, no rales,  ABD/GI: Normal bowel sounds; non-distended; soft, very mild diffuse tenderness to palpation without organ no rebound, no peritoneal signs GU:  Normal external genitalia, patient has mild cervical motion tenderness and right adnexal tenderness without fullness, no left adnexal tenderness or fullness, no vaginal bleeding, minimal amount of thin, white vaginal discharge BACK:  The back appears normal and is non-tender to palpation, there is no CVA tenderness EXT: Normal ROM in all joints; non-tender to palpation; no edema; normal capillary refill; no cyanosis    SKIN: Normal  color for age and race; warm NEURO: Moves all extremities equally PSYCH: The patient's mood and manner are appropriate. Grooming and personal hygiene are appropriate.  MEDICAL DECISION MAKING: Patient here with chest pain and abdominal pain. Her exam is unremarkable. She is  hemodynamically stable. Will repeat abdominal labs, urine and pelvic exam. Will also obtain a chest x-ray, abdominal series. I do not feel she needs repeat CT scan today. Have also discussed with patient that he not on narcotics are indicated today and that she will need to followup with her primary care physician to evaluate her for her abdominal pain.  She agrees to taking Toradol but states she has to have Benadryl when taking this medication. Given her benign exam, I have very low concern for a life-threatening illness.  ED PROGRESS: She has right adnexal tenderness and cervical motion tenderness on exam. Her gonorrhea and Chlamydia cultures on 07/04/13 were negative. She is sexually active with her husband but denies any other new partners. Will obtain transvaginal ultrasound to evaluate her right adnexa.  Patient does have bacterial vaginosis. Her labs are unremarkable. Abdominal series negative. Her transvaginal ultrasound was also negative. Treat with Flagyl for one week. She is tolerating by mouth without any further vomiting. We'll discharge home with PCP followup, return precautions. I will not send patient home with narcotics.   EKG Interpretation     Ventricular Rate:  84 PR Interval:  183 QRS Duration: 76 QT Interval:  349 QTC Calculation: 412 R Axis:   73 Text Interpretation:  Sinus rhythm               Layla Maw Deyana Wnuk, DO 07/10/13 2029

## 2013-07-12 ENCOUNTER — Emergency Department (HOSPITAL_COMMUNITY)
Admission: EM | Admit: 2013-07-12 | Discharge: 2013-07-12 | Disposition: A | Discharge status: Home / Self Care | Payer: Medicare Other | Attending: Emergency Medicine | Admitting: Emergency Medicine

## 2013-07-12 DIAGNOSIS — Z91040 Latex allergy status: Secondary | ICD-10-CM | POA: Insufficient documentation

## 2013-07-12 DIAGNOSIS — D649 Anemia, unspecified: Secondary | ICD-10-CM | POA: Insufficient documentation

## 2013-07-12 DIAGNOSIS — R112 Nausea with vomiting, unspecified: Secondary | ICD-10-CM | POA: Insufficient documentation

## 2013-07-12 DIAGNOSIS — R079 Chest pain, unspecified: Secondary | ICD-10-CM | POA: Insufficient documentation

## 2013-07-12 DIAGNOSIS — Z880 Allergy status to penicillin: Secondary | ICD-10-CM | POA: Insufficient documentation

## 2013-07-12 DIAGNOSIS — Z87442 Personal history of urinary calculi: Secondary | ICD-10-CM | POA: Insufficient documentation

## 2013-07-12 DIAGNOSIS — G8929 Other chronic pain: Secondary | ICD-10-CM | POA: Insufficient documentation

## 2013-07-12 DIAGNOSIS — Z888 Allergy status to other drugs, medicaments and biological substances status: Secondary | ICD-10-CM | POA: Insufficient documentation

## 2013-07-12 DIAGNOSIS — R109 Unspecified abdominal pain: Secondary | ICD-10-CM

## 2013-07-12 DIAGNOSIS — Z3202 Encounter for pregnancy test, result negative: Secondary | ICD-10-CM | POA: Insufficient documentation

## 2013-07-12 DIAGNOSIS — Z8719 Personal history of other diseases of the digestive system: Secondary | ICD-10-CM | POA: Insufficient documentation

## 2013-07-12 DIAGNOSIS — F17200 Nicotine dependence, unspecified, uncomplicated: Secondary | ICD-10-CM | POA: Insufficient documentation

## 2013-07-12 MED ORDER — OXYCODONE-ACETAMINOPHEN 5-325 MG PO TABS
1.0000 | ORAL_TABLET | Freq: Once | ORAL | Status: AC
  Administered 2013-07-12: 1 via ORAL
  Filled 2013-07-12: qty 1

## 2013-07-12 MED ORDER — OXYCODONE-ACETAMINOPHEN 5-325 MG PO TABS: 2.0000 | ORAL_TABLET | Freq: Once | ORAL | Status: DC

## 2013-07-12 MED ORDER — PROMETHAZINE HCL 25 MG PO TABS: 25.0000 mg | ORAL_TABLET | Freq: Four times a day (QID) | ORAL | Status: DC | PRN

## 2013-07-12 NOTE — ED Provider Notes (Addendum)
CSN: [id number]     Arrival date & time 07/12/13  0033 History   None    No chief complaint on file.  (Consider location/radiation/quality/duration/timing/severity/associated sxs/prior Treatment) HPI This patient is a generally healthy young woman with a remote history of cholecystectomy, chronic abdominal pain and a history of nephrolithiasis. She presents with complaints of right-sided abdominal pain for the past week. Pain seems to originate from the right flank region and radiates into the right lower quadrant. Pain is constant. It is worse when she is in the supine position. The patient's had nausea and vomiting for the past 2 days and says she has been unable to tolerate even liquids. She has had about 4-5 episodes of emesis and total. She denies diarrhea. No fever, no genitourinary symptoms. Her vaginal discharge.  Despite the patient's documented history of chronic abdominal pain, she denies history of similar symptoms. She has tried ibuprofen and Tylenol without adequate relief.  Past Medical History  Diagnosis Date  . Kidney stones   . Anemia   . Chronic abdominal pain    Past Surgical History  Procedure Laterality Date  . Cholecystectomy     No family history on file. History  Substance Use Topics  . Smoking status: Current Every Day Smoker    Types: Cigarettes  . Smokeless tobacco: Not on file  . Alcohol Use: No   OB History   Grav Para Term Preterm Abortions TAB SAB Ect Mult Living                 Review of Systems 10 point review of systems performed and is negative with the exception of symptoms noted above  Allergies  Compazine; Donnatal; Haldol; Latex; Lidocaine; Maalox; Mushroom extract complex; Orange fruit; Other; Penicillins; Reglan; Toradol; Tramadol; and Zofran  Home Medications   Current Outpatient Rx  Name  Route  Sig  Dispense  Refill  . metroNIDAZOLE (FLAGYL) 500 MG tablet   Oral   Take 1 tablet (500 mg total) by mouth 2 (two) times daily.    14 tablet   0    LMP 06/15/2013 Physical Exam Gen: well developed and well nourished appearing Head: NCAT Eyes: PERL, EOMI Nose: no epistaixis or rhinorrhea Mouth/throat: mucosa is moist and pink Neck: supple, no stridor Lungs: CTA B, no wheezing, rhonchi or rales CV: Regular rate and rhythm, no murmur, extremities appear well perfused Abd: soft, notender, nondistended Back: no ttp, no cva ttp Skin: no rashese, wnl Neuro: CN ii-xii grossly intact, no focal deficits Psyche; normal affect,  calm and cooperative.   ED Course  Procedures (including critical care time) Labs Review   Imaging Review US Transvaginal Non-ob  07/10/2013   CLINICAL DATA:  Right adnexal tenderness right upper quadrant in right side pain, negative pregnancy test  EXAM: TRANSABDOMINAL AND TRANSVAGINAL ULTRASOUND OF PELVIS  DOPPLER ULTRASOUND OF OVARIES  TECHNIQUE: Both transabdominal and transvaginal ultrasound examinations of the pelvis were performed. Transabdominal technique was performed for global imaging of the pelvis including uterus, ovaries, adnexal regions, and pelvic cul-de-sac.  It was necessary to proceed with endovaginal exam following the transabdominal exam to visualize the ovaries and adnexae. Color and duplex Doppler ultrasound was utilized to evaluate blood flow to the ovaries.  COMPARISON:  10/01/2010  FINDINGS: Uterus  Measurements: 6.7 x 2.9 x 3.8 cm. Normal morphology without mass  Endometrium  Thickness: 9 mm thick, normal. No endometrial fluid or focal abnormality.  Right ovary  Measurements: 1.8 x 2.7 x 2.6 cm. Normal morphology  without mass. Internal blood flow present on color Doppler imaging.  Left ovary  Measurements: 1.2 x 1.6 x 1.4 cm. Normal morphology without mass. Internal blood flow present on color Doppler imaging.  Pulsed Doppler evaluation of both ovaries demonstrates normal low-resistance arterial and venous waveforms.  Other findings  Trace free pelvic fluid. No adnexal masses.   IMPRESSION: No pelvic sonographic abnormalities identified.  Specifically no evidence of ovarian torsion.  No sonographic evidence for ovarian torsion.   Electronically Signed   By: Ulyses Southward M.D.   On: 07/10/2013 20:23   US Pelvis Complete  07/10/2013   CLINICAL DATA:  Right adnexal tenderness right upper quadrant in right side pain, negative pregnancy test  EXAM: TRANSABDOMINAL AND TRANSVAGINAL ULTRASOUND OF PELVIS  DOPPLER ULTRASOUND OF OVARIES  TECHNIQUE: Both transabdominal and transvaginal ultrasound examinations of the pelvis were performed. Transabdominal technique was performed for global imaging of the pelvis including uterus, ovaries, adnexal regions, and pelvic cul-de-sac.  It was necessary to proceed with endovaginal exam following the transabdominal exam to visualize the ovaries and adnexae. Color and duplex Doppler ultrasound was utilized to evaluate blood flow to the ovaries.  COMPARISON:  10/01/2010  FINDINGS: Uterus  Measurements: 6.7 x 2.9 x 3.8 cm. Normal morphology without mass  Endometrium  Thickness: 9 mm thick, normal. No endometrial fluid or focal abnormality.  Right ovary  Measurements: 1.8 x 2.7 x 2.6 cm. Normal morphology without mass. Internal blood flow present on color Doppler imaging.  Left ovary  Measurements: 1.2 x 1.6 x 1.4 cm. Normal morphology without mass. Internal blood flow present on color Doppler imaging.  Pulsed Doppler evaluation of both ovaries demonstrates normal low-resistance arterial and venous waveforms.  Other findings  Trace free pelvic fluid. No adnexal masses.  IMPRESSION: No pelvic sonographic abnormalities identified.  Specifically no evidence of ovarian torsion.  No sonographic evidence for ovarian torsion.   Electronically Signed   By: Ulyses Southward M.D.   On: 07/10/2013 20:23   [place] Art/ven Flow Abd Pelv Doppler  07/10/2013   CLINICAL DATA:  Right adnexal tenderness right upper quadrant in right side pain, negative pregnancy test  EXAM: TRANSABDOMINAL  AND TRANSVAGINAL ULTRASOUND OF PELVIS  DOPPLER ULTRASOUND OF OVARIES  TECHNIQUE: Both transabdominal and transvaginal ultrasound examinations of the pelvis were performed. Transabdominal technique was performed for global imaging of the pelvis including uterus, ovaries, adnexal regions, and pelvic cul-de-sac.  It was necessary to proceed with endovaginal exam following the transabdominal exam to visualize the ovaries and adnexae. Color and duplex Doppler ultrasound was utilized to evaluate blood flow to the ovaries.  COMPARISON:  10/01/2010  FINDINGS: Uterus  Measurements: 6.7 x 2.9 x 3.8 cm. Normal morphology without mass  Endometrium  Thickness: 9 mm thick, normal. No endometrial fluid or focal abnormality.  Right ovary  Measurements: 1.8 x 2.7 x 2.6 cm. Normal morphology without mass. Internal blood flow present on color Doppler imaging.  Left ovary  Measurements: 1.2 x 1.6 x 1.4 cm. Normal morphology without mass. Internal blood flow present on color Doppler imaging.  Pulsed Doppler evaluation of both ovaries demonstrates normal low-resistance arterial and venous waveforms.  Other findings  Trace free pelvic fluid. No adnexal masses.  IMPRESSION: No pelvic sonographic abnormalities identified.  Specifically no evidence of ovarian torsion.  No sonographic evidence for ovarian torsion.   Electronically Signed   By: Ulyses Southward M.D.   On: 07/10/2013 20:23   Dg Abd Acute W/chest  07/10/2013   CLINICAL DATA:  Chest  and abdominal pain  EXAM: ACUTE ABDOMEN SERIES (ABDOMEN 2 VIEW & CHEST 1 VIEW)  COMPARISON:  07/02/2013  FINDINGS: The heart and pulmonary vascularity are within normal limits. The lungs are clear bilaterally.  The abdomen demonstrates nonobstructive bowel gas pattern. No free air is seen. Changes consistent with prior cholecystectomy are noted. The bony structures are unremarkable.  IMPRESSION: Nonspecific chest and abdomen. No acute abnormality is seen.   Electronically Signed   By: Alcide Clever M.D.    On: 07/10/2013 18:17     MDM  Chart review shows that this is the patient's 17th ED visit this year and her 5th in the past 10 days for the same complaint.  The patient has already been worked up extensively with CT of the abd/pelvis and U/S of the pelvis - both studies were normal. The patient had a pelvic exam and was diagnosed with BV. She is being treated with Metronidazole for this. Her VS are wnl and she is stable for d/c with plan for outpatient follow up and script for anti-emetic.     Brandt Loosen, MD 07/12/13 1610  Brandt Loosen, MD 07/14/13 [phone]

## 2013-09-04 DIAGNOSIS — Z888 Allergy status to other drugs, medicaments and biological substances status: Secondary | ICD-10-CM | POA: Insufficient documentation

## 2013-09-04 DIAGNOSIS — G8929 Other chronic pain: Secondary | ICD-10-CM | POA: Insufficient documentation

## 2013-09-04 DIAGNOSIS — Z87442 Personal history of urinary calculi: Secondary | ICD-10-CM | POA: Insufficient documentation

## 2013-09-04 DIAGNOSIS — D649 Anemia, unspecified: Secondary | ICD-10-CM | POA: Insufficient documentation

## 2013-09-04 DIAGNOSIS — R112 Nausea with vomiting, unspecified: Secondary | ICD-10-CM | POA: Insufficient documentation

## 2013-09-04 DIAGNOSIS — F17200 Nicotine dependence, unspecified, uncomplicated: Secondary | ICD-10-CM | POA: Insufficient documentation

## 2013-09-04 DIAGNOSIS — Z91040 Latex allergy status: Secondary | ICD-10-CM | POA: Insufficient documentation

## 2013-09-04 DIAGNOSIS — Z3202 Encounter for pregnancy test, result negative: Secondary | ICD-10-CM | POA: Insufficient documentation

## 2013-09-04 DIAGNOSIS — R072 Precordial pain: Secondary | ICD-10-CM | POA: Insufficient documentation

## 2013-09-04 DIAGNOSIS — R1084 Generalized abdominal pain: Secondary | ICD-10-CM | POA: Insufficient documentation

## 2013-09-04 DIAGNOSIS — R300 Dysuria: Secondary | ICD-10-CM | POA: Insufficient documentation

## 2013-09-04 DIAGNOSIS — Z8719 Personal history of other diseases of the digestive system: Secondary | ICD-10-CM | POA: Insufficient documentation

## 2013-09-04 DIAGNOSIS — Z880 Allergy status to penicillin: Secondary | ICD-10-CM | POA: Insufficient documentation

## 2013-09-05 ENCOUNTER — Encounter (HOSPITAL_COMMUNITY): Payer: Self-pay | Admitting: Emergency Medicine

## 2013-09-05 ENCOUNTER — Emergency Department (HOSPITAL_COMMUNITY)
Admission: EM | Admit: 2013-09-05 | Discharge: 2013-09-05 | Disposition: A | Discharge status: Home / Self Care | Payer: Medicare Other | Attending: Emergency Medicine | Admitting: Emergency Medicine

## 2013-09-05 DIAGNOSIS — G8929 Other chronic pain: Secondary | ICD-10-CM

## 2013-09-05 DIAGNOSIS — R079 Chest pain, unspecified: Secondary | ICD-10-CM

## 2013-09-05 LAB — CBC
Hemoglobin: 13.6 g/dL (ref 12.0–15.0)
MCHC: 33.9 g/dL (ref 30.0–36.0)
RDW: 13 % (ref 11.5–15.5)
WBC: 5.5 10*3/uL (ref 4.0–10.5)

## 2013-09-05 LAB — BASIC METABOLIC PANEL
GFR calc Af Amer: >90 mL/min (ref >90)
GFR calc non Af Amer: >90 mL/min (ref >90)
Glucose, Bld: 71 mg/dL (ref 70–99)
Potassium: 3.8 mEq/L (ref 3.5–5.1)
Sodium: 140 mEq/L (ref 135–145)

## 2013-09-05 LAB — URINE MICROSCOPIC-ADD ON

## 2013-09-05 LAB — URINALYSIS, ROUTINE W REFLEX MICROSCOPIC
Bilirubin Urine: NEGATIVE
Glucose, UA: NEGATIVE mg/dL
Hgb urine dipstick: NEGATIVE
Nitrite: NEGATIVE
Protein, ur: NEGATIVE mg/dL
Urobilinogen, UA: 0.2 mg/dL (ref 0.0–1.0)

## 2013-09-05 LAB — POCT PREGNANCY, URINE: Preg Test, Ur: NEGATIVE

## 2013-09-05 LAB — POCT I-STAT TROPONIN I: Troponin i, poc: 0 ng/mL (ref 0.00–0.08)

## 2013-09-05 MED ORDER — SODIUM CHLORIDE 0.9 % IV BOLUS (SEPSIS)
1000.0000 mL | Freq: Once | INTRAVENOUS | Status: AC
  Administered 2013-09-05: 1000 mL via INTRAVENOUS

## 2013-09-05 MED ORDER — PROMETHAZINE HCL 25 MG PO TABS: 25.0000 mg | ORAL_TABLET | Freq: Four times a day (QID) | ORAL | Status: DC | PRN

## 2013-09-05 MED ORDER — PROMETHAZINE HCL 25 MG/ML IJ SOLN
25.0000 mg | Freq: Once | INTRAMUSCULAR | Status: AC
  Administered 2013-09-05: 25 mg via INTRAMUSCULAR
  Filled 2013-09-05: qty 1

## 2013-09-05 NOTE — ED Provider Notes (Signed)
CSN: [id number]     Arrival date & time 09/04/13  2300 History   First MD Initiated Contact with Patient 09/05/13 0540     Chief Complaint  Patient presents with  . Chest Pain   (Consider location/radiation/quality/duration/timing/severity/associated sxs/prior Treatment) HPI Comments: 27 year old female who has a history of chronic abdominal pain who presents with a complaint of abdominal pain and chest pain. She states that she has been having chest pain which is located in the substernal area, seems to get worse when she moves, worse when she breathes, worse when she is bending or rotating. Nothing seems to make it better, is not associated with fevers chills coughing or shortness of breath. Her abdominal pain seems to be generalized, it is chronic, nothing has changed compared to normal but she does note nausea and vomiting which has prevented her from having adequate oral intake over the last week. Review of the medical records shows that she has been seen multiple times for abdominal complaints, multiple workups, she is oriented cholecystectomy. She does report having peptic ulcer disease in the past that was confirmed by endoscopy  Patient is a 27 y.o. female presenting with chest pain. The history is provided by the patient.  Chest Pain   Past Medical History  Diagnosis Date  . Kidney stones   . Anemia   . Chronic abdominal pain    Past Surgical History  Procedure Laterality Date  . Cholecystectomy     No family history on file. History  Substance Use Topics  . Smoking status: Current Every Day Smoker    Types: Cigarettes  . Smokeless tobacco: Not on file  . Alcohol Use: No   OB History   Grav Para Term Preterm Abortions TAB SAB Ect Mult Living                 Review of Systems  Cardiovascular: Positive for chest pain.  All other systems reviewed and are negative.    Allergies  Compazine; Donnatal; Haldol; Latex; Lidocaine; Maalox; Mushroom extract complex; Orange  fruit; Other; Penicillins; Reglan; Toradol; Tramadol; and Zofran  Home Medications   Current Outpatient Rx  Name  Route  Sig  Dispense  Refill  . promethazine (PHENERGAN) 25 MG tablet   Oral   Take 1 tablet (25 mg total) by mouth every 6 (six) hours as needed for nausea or vomiting.   12 tablet   0    BP 115/64  Pulse 93  Temp(Src) 98.2 F (36.8 C) (Oral)  Resp 34  SpO2 100% Physical Exam  Nursing note and vitals reviewed. Constitutional: She appears well-developed and well-nourished. No distress.  HENT:  Head: Normocephalic and atraumatic.  Mouth/Throat: Oropharynx is clear and moist. No oropharyngeal exudate.  Eyes: Conjunctivae and EOM are normal. Pupils are equal, round, and reactive to light. Right eye exhibits no discharge. Left eye exhibits no discharge. No scleral icterus.  Neck: Normal range of motion. Neck supple. No JVD present. No thyromegaly present.  Cardiovascular: Normal rate, regular rhythm, normal heart sounds and intact distal pulses.  Exam reveals no gallop and no friction rub.   No murmur heard. Pulmonary/Chest: Effort normal and breath sounds normal. No respiratory distress. She has no wheezes. She has no rales.  Abdominal: Soft. Bowel sounds are normal. She exhibits no distension and no mass. There is no tenderness.  The patient does not withdraw to pain, she does not have any focalized tenderness, there is no surgical signs in her abdomen. There is no pain  at [facility] point  Musculoskeletal: Normal range of motion. She exhibits no edema and no tenderness.  Lymphadenopathy:    She has no cervical adenopathy.  Neurological: She is alert. Coordination normal.  Skin: Skin is warm and dry. No rash noted. No erythema.  Psychiatric: She has a normal mood and affect. Her behavior is normal.    ED Course  Procedures (including critical care time) Labs Review Labs Reviewed  URINALYSIS, ROUTINE W REFLEX MICROSCOPIC - Abnormal; Notable for the following:     APPearance TURBID (*)    All other components within normal limits  CBC  BASIC METABOLIC PANEL  LIPASE, BLOOD  URINE MICROSCOPIC-ADD ON  POCT PREGNANCY, URINE  POCT I-STAT TROPONIN I   Imaging Review No results found.  EKG Interpretation    Date/Time:  Friday September 04 2013 23:57:19 EST Ventricular Rate:  91 PR Interval:  168 QRS Duration: 84 QT Interval:  336 QTC Calculation: 413 R Axis:   78 Text Interpretation:  Normal sinus rhythm Normal ECG since last tracing no significant change Confirmed by Jannely Henthorn  MD, Eldean Nanna ([id number]) on 09/05/2013 5:41:17 AM            MDM   1. Chronic abdominal pain   2. Chest pain    The patient's EKG is unremarkable, her laboratory work shows normal blood counts, normal electrolytes, normal lipase and a normal troponin. She is not pregnant, urinalysis is pending as she does complain of mild assure you. I personally placed an IV, she will be some hydration, Zofran, followup.  Angiocath insertion Performed by: Vida Roller  Consent: Verbal consent obtained. Risks and benefits: risks, benefits and alternatives were discussed Time out: Immediately prior to procedure a "time out" was called to verify the correct patient, procedure, equipment, support staff and site/side marked as required.  Preparation: Patient was prepped and draped in the usual sterile fashion.  Vein Location: 20  Not Ultrasound Guided  Gauge: 20  Normal blood return and flush without difficulty Patient tolerance: Patient tolerated the procedure well with no immediate complications.    Labs normal, IV fluids given, no signs of urinary infection or significant dehydration, the patient does appear stable for discharge.   Meds given in ED:  Medications  sodium chloride 0.9 % bolus 1,000 mL (1,000 mLs Intravenous New Bag/Given 09/05/13 0617)  promethazine (PHENERGAN) injection 25 mg (25 mg Intramuscular Given 09/05/13 0618)    New Prescriptions   PROMETHAZINE  (PHENERGAN) 25 MG TABLET    Take 1 tablet (25 mg total) by mouth every 6 (six) hours as needed for nausea or vomiting.       Vida Roller, MD 09/05/13 [phone]

## 2013-09-05 NOTE — ED Notes (Signed)
Pt. reports pain across the chest for 1 week with nausea and vomitting , pt. also reported chronic generalized abdominal pain .

## 2013-09-05 NOTE — ED Notes (Signed)
Pt stated that she was not allergic to Phenergan when asked.  Pt stated she could only not take zofran and reglan.

## 2013-09-05 NOTE — ED Notes (Signed)
Pt states she has been having, CP, Nausea and vomiting and states she is having some odor and burning with urination.  Pt states some relief with norco given from her mother.

## 2013-09-07 ENCOUNTER — Encounter (HOSPITAL_COMMUNITY): Payer: Self-pay | Admitting: Emergency Medicine

## 2013-09-07 ENCOUNTER — Emergency Department (HOSPITAL_COMMUNITY)
Admission: EM | Admit: 2013-09-07 | Discharge: 2013-09-07 | Disposition: A | Discharge status: Home / Self Care | Payer: Medicare Other | Attending: Emergency Medicine | Admitting: Emergency Medicine

## 2013-09-07 DIAGNOSIS — G8929 Other chronic pain: Secondary | ICD-10-CM | POA: Insufficient documentation

## 2013-09-07 DIAGNOSIS — F17200 Nicotine dependence, unspecified, uncomplicated: Secondary | ICD-10-CM | POA: Insufficient documentation

## 2013-09-07 DIAGNOSIS — N730 Acute parametritis and pelvic cellulitis: Secondary | ICD-10-CM | POA: Insufficient documentation

## 2013-09-07 DIAGNOSIS — Z87442 Personal history of urinary calculi: Secondary | ICD-10-CM | POA: Insufficient documentation

## 2013-09-07 DIAGNOSIS — Z880 Allergy status to penicillin: Secondary | ICD-10-CM | POA: Insufficient documentation

## 2013-09-07 DIAGNOSIS — Z91040 Latex allergy status: Secondary | ICD-10-CM | POA: Insufficient documentation

## 2013-09-07 DIAGNOSIS — Z862 Personal history of diseases of the blood and blood-forming organs and certain disorders involving the immune mechanism: Secondary | ICD-10-CM | POA: Insufficient documentation

## 2013-09-07 DIAGNOSIS — Z9089 Acquired absence of other organs: Secondary | ICD-10-CM | POA: Insufficient documentation

## 2013-09-07 LAB — HIV ANTIBODY (ROUTINE TESTING W REFLEX): HIV: NONREACTIVE

## 2013-09-07 LAB — COMPREHENSIVE METABOLIC PANEL
Albumin: 3.8 g/dL (ref 3.5–5.2)
BUN: 11 mg/dL (ref 6–23)
CO2: 23 mEq/L (ref 19–32)
Chloride: 103 mEq/L (ref 96–112)
Creatinine, Ser: 0.56 mg/dL (ref 0.50–1.10)
GFR calc Af Amer: >90 mL/min (ref >90)
Potassium: 3.5 mEq/L (ref 3.5–5.1)
Total Bilirubin: 0.4 mg/dL (ref 0.3–1.2)
Total Protein: 7 g/dL (ref 6.0–8.3)

## 2013-09-07 LAB — URINALYSIS, ROUTINE W REFLEX MICROSCOPIC
Ketones, ur: NEGATIVE mg/dL
Leukocytes, UA: NEGATIVE
Nitrite: NEGATIVE
Protein, ur: NEGATIVE mg/dL
Specific Gravity, Urine: 1.007 (ref 1.005–1.030)
Urobilinogen, UA: 0.2 mg/dL (ref 0.0–1.0)
pH: 7 (ref 5.0–8.0)

## 2013-09-07 LAB — CBC WITH DIFFERENTIAL/PLATELET
Basophils Relative: 0 % (ref 0–1)
Eosinophils Absolute: 0.1 10*3/uL (ref 0.0–0.7)
HCT: 36.1 % (ref 36.0–46.0)
Hemoglobin: 12.2 g/dL (ref 12.0–15.0)
MCH: 31 pg (ref 26.0–34.0)
MCHC: 33.8 g/dL (ref 30.0–36.0)
Monocytes Absolute: 0.4 10*3/uL (ref 0.1–1.0)
Monocytes Relative: 7 % (ref 3–12)
Neutro Abs: 3.7 10*3/uL (ref 1.7–7.7)
RBC: 3.94 MIL/uL (ref 3.87–5.11)

## 2013-09-07 LAB — WET PREP, GENITAL
Trich, Wet Prep: NONE SEEN
Yeast Wet Prep HPF POC: NONE SEEN

## 2013-09-07 LAB — LIPASE, BLOOD: Lipase: 25 U/L (ref 11–59)

## 2013-09-07 MED ORDER — DOXYCYCLINE HYCLATE 100 MG PO TABS
100.0000 mg | ORAL_TABLET | Freq: Once | ORAL | Status: AC
  Administered 2013-09-07: 100 mg via ORAL
  Filled 2013-09-07: qty 1

## 2013-09-07 MED ORDER — MORPHINE SULFATE 4 MG/ML IJ SOLN
4.0000 mg | Freq: Once | INTRAMUSCULAR | Status: AC
  Administered 2013-09-07: 4 mg via INTRAVENOUS
  Filled 2013-09-07: qty 1

## 2013-09-07 MED ORDER — DOXYCYCLINE HYCLATE 100 MG PO CAPS: 100.0000 mg | ORAL_CAPSULE | Freq: Two times a day (BID) | ORAL | Status: DC

## 2013-09-07 MED ORDER — CEFTRIAXONE SODIUM 250 MG IJ SOLR
250.0000 mg | Freq: Once | INTRAMUSCULAR | Status: DC
  Filled 2013-09-07: qty 250

## 2013-09-07 MED ORDER — CEFTRIAXONE SODIUM 250 MG IJ SOLR
250.0000 mg | Freq: Once | INTRAMUSCULAR | Status: AC
  Administered 2013-09-07: 250 mg via INTRAMUSCULAR
  Filled 2013-09-07: qty 250

## 2013-09-07 MED ORDER — SODIUM CHLORIDE 0.9 % IV SOLN
Freq: Once | INTRAVENOUS | Status: AC
  Administered 2013-09-07: 06:00:00 via INTRAVENOUS

## 2013-09-07 MED ORDER — LIDOCAINE HCL (PF) 1 % IJ SOLN
INTRAMUSCULAR | Status: AC
  Filled 2013-09-07: qty 5

## 2013-09-07 MED ORDER — AZITHROMYCIN 1 G PO PACK
1.0000 g | PACK | Freq: Once | ORAL | Status: AC
  Administered 2013-09-07: 1 g via ORAL
  Filled 2013-09-07: qty 1

## 2013-09-07 MED ORDER — PROMETHAZINE HCL 25 MG/ML IJ SOLN
25.0000 mg | Freq: Once | INTRAMUSCULAR | Status: AC
  Administered 2013-09-07: 25 mg via INTRAMUSCULAR
  Filled 2013-09-07: qty 1

## 2013-09-07 MED ORDER — OXYCODONE-ACETAMINOPHEN 5-325 MG PO TABS: 1.0000 | ORAL_TABLET | ORAL | Status: DC | PRN

## 2013-09-07 NOTE — ED Notes (Signed)
Pt discharged home with all belongings, pt alert and ambulatory upon discharge, 2 new RX given pt verbalizes understanding of discharge instructions, pt driven home by spouse

## 2013-09-07 NOTE — ED Notes (Signed)
Called to room by pt, c/o continued pain, 7/10, also states, "want to leave pain free", "need to be somewhere else by 0900", "not sure if I need to sign myself out", "aware that i am waiting on lab results", EDP notified.

## 2013-09-07 NOTE — ED Notes (Signed)
Pt was seen yesterday for abdominal pain. Pt states that she was told to come back if pain was worse. Pt states that she tried taking her pherergan and she was still nauseated. Pt states normal bowel movements. Pt denies N/V.

## 2013-09-07 NOTE — ED Provider Notes (Signed)
CSN: [id number]     Arrival date & time 09/07/13  0107 History   First MD Initiated Contact with Patient 09/07/13 [phone]     Chief Complaint  Patient presents with  . Abdominal Pain   (Consider location/radiation/quality/duration/timing/severity/associated sxs/prior Treatment) Patient is a 27 y.o. female presenting with abdominal pain. The history is provided by the patient.  Abdominal Pain She is complaining of suprapubic pain for the last week. Pain is crampy in nature and worse if she stands and walks. Pain is as severe as 8/10 but in most of side to 4/10 when she is laying still. She is complaining of nausea but has not been vomiting. She has had dysuria but no urinary urgency, frequency, tenesmus. She is noted some malodorous vaginal discharge. She denies fever, chills, sweats. She been seen in the ED yesterday and given prescription for promethazine but states that it does not seem to help her nausea. She did take a dose of hydrocodone and acetaminophen which did not give her any relief of pain. She has a history of chronic abdominal pain but states that that is usually upper abdominal pain and lower abdominal pain.  Past Medical History  Diagnosis Date  . Kidney stones   . Anemia   . Chronic abdominal pain    Past Surgical History  Procedure Laterality Date  . Cholecystectomy     History reviewed. No pertinent family history. History  Substance Use Topics  . Smoking status: Current Every Day Smoker    Types: Cigarettes  . Smokeless tobacco: Not on file  . Alcohol Use: No   OB History   Grav Para Term Preterm Abortions TAB SAB Ect Mult Living                 Review of Systems  Gastrointestinal: Positive for abdominal pain.  All other systems reviewed and are negative.    Allergies  Compazine; Donnatal; Haldol; Latex; Lidocaine; Maalox; Mushroom extract complex; Orange fruit; Other; Penicillins; Reglan; Toradol; Tramadol; and Zofran  Home Medications   Current  Outpatient Rx  Name  Route  Sig  Dispense  Refill  . acetaminophen (TYLENOL) 500 MG tablet   Oral   Take 500 mg by mouth every 6 (six) hours as needed for mild pain or moderate pain.         Marland Kitchen HYDROcodone-acetaminophen (NORCO/VICODIN) 5-325 MG per tablet   Oral   Take 1 tablet by mouth every 6 (six) hours as needed for moderate pain.         . promethazine (PHENERGAN) 25 MG tablet   Oral   Take 1 tablet (25 mg total) by mouth every 6 (six) hours as needed for nausea or vomiting.   12 tablet   0    BP 118/72  Pulse 78  Temp(Src) 98.2 F (36.8 C) (Oral)  Resp 16  SpO2 98% Physical Exam  Nursing note and vitals reviewed.  27 year old female, resting comfortably and in no acute distress. Vital signs are normal. Oxygen saturation is 98%, which is normal. Head is normocephalic and atraumatic. PERRLA, EOMI. Oropharynx is clear. Neck is nontender and supple without adenopathy or JVD. Back is nontender and there is no CVA tenderness. Lungs are clear without rales, wheezes, or rhonchi. Chest is nontender. Heart has regular rate and rhythm without murmur. Abdomen is soft, flat, with mild suprapubic tenderness. There are no masses or hepatosplenomegaly and peristalsis is hypoactive. Extremities have no cyanosis or edema, full range of motion is present. Skin  is warm and dry without rash. Neurologic: Mental status is normal, cranial nerves are intact, there are no motor or sensory deficits.  ED Course  Procedures (including critical care time) Labs Review Results for orders placed during the hospital encounter of 09/07/13  WET PREP, GENITAL      Result Value Range   Yeast Wet Prep HPF POC NONE SEEN  NONE SEEN   Trich, Wet Prep NONE SEEN  NONE SEEN   Clue Cells Wet Prep HPF POC FEW (*) NONE SEEN   WBC, Wet Prep HPF POC TOO NUMEROUS TO COUNT (*) NONE SEEN  CBC WITH DIFFERENTIAL      Result Value Range   WBC 6.1  4.0 - 10.5 K/uL   RBC 3.94  3.87 - 5.11 MIL/uL   Hemoglobin 12.2   12.0 - 15.0 g/dL   HCT 16.1  09.6 - 04.5 %   MCV 91.6  78.0 - 100.0 fL   MCH 31.0  26.0 - 34.0 pg   MCHC 33.8  30.0 - 36.0 g/dL   RDW 40.9  81.1 - 91.4 %   Platelets 211  150 - 400 K/uL   Neutrophils Relative % 61  43 - 77 %   Neutro Abs 3.7  1.7 - 7.7 K/uL   Lymphocytes Relative 30  12 - 46 %   Lymphs Abs 1.8  0.7 - 4.0 K/uL   Monocytes Relative 7  3 - 12 %   Monocytes Absolute 0.4  0.1 - 1.0 K/uL   Eosinophils Relative 2  0 - 5 %   Eosinophils Absolute 0.1  0.0 - 0.7 K/uL   Basophils Relative 0  0 - 1 %   Basophils Absolute 0.0  0.0 - 0.1 K/uL  COMPREHENSIVE METABOLIC PANEL      Result Value Range   Sodium 136  135 - 145 mEq/L   Potassium 3.5  3.5 - 5.1 mEq/L   Chloride 103  96 - 112 mEq/L   CO2 23  19 - 32 mEq/L   Glucose, Bld 84  70 - 99 mg/dL   BUN 11  6 - 23 mg/dL   Creatinine, Ser 7.82  0.50 - 1.10 mg/dL   Calcium 8.7  8.4 - 95.6 mg/dL   Total Protein 7.0  6.0 - 8.3 g/dL   Albumin 3.8  3.5 - 5.2 g/dL   AST 22  0 - 37 U/L   ALT 14  0 - 35 U/L   Alkaline Phosphatase 65  39 - 117 U/L   Total Bilirubin 0.4  0.3 - 1.2 mg/dL   GFR calc non Af Amer >90  >90 mL/min   GFR calc Af Amer >90  >90 mL/min  LIPASE, BLOOD      Result Value Range   Lipase 25  11 - 59 U/L  URINALYSIS, ROUTINE W REFLEX MICROSCOPIC      Result Value Range   Color, Urine YELLOW  YELLOW   APPearance CLOUDY (*) CLEAR   Specific Gravity, Urine 1.007  1.005 - 1.030   pH 7.0  5.0 - 8.0   Glucose, UA NEGATIVE  NEGATIVE mg/dL   Hgb urine dipstick NEGATIVE  NEGATIVE   Bilirubin Urine NEGATIVE  NEGATIVE   Ketones, ur NEGATIVE  NEGATIVE mg/dL   Protein, ur NEGATIVE  NEGATIVE mg/dL   Urobilinogen, UA 0.2  0.0 - 1.0 mg/dL   Nitrite NEGATIVE  NEGATIVE   Leukocytes, UA NEGATIVE  NEGATIVE   MDM   1. PID (acute pelvic inflammatory disease)  Pelvic pain of uncertain cause. She'll need to have a pelvic examination done. Old records are reviewed and she had been seen for chest pain and abdominal pain  yesterday and has had multiple ED visits for chronic abdominal pain. She's had negative CT scans of the abdomen and had a negative abdominal ultrasound.  Pelvic exam shows normal external female genitalia, small amount of white vaginal discharge with some caseous material suspicious for possible Candida vaginitis. On bimanual exam, fundus is normal size and position, but there is mild to moderate cervical motion tenderness as well as bilateral adnexal tenderness which is worse in the left. This reproduces her pain. I am suspicious that she has a pelvic inflammatory disease and she is treated with intramuscular ceftriaxone as well as oral azithromycin. She will be discharged with prescription for doxycycline. Wet prep is still pending at this time.  Wet prep is negative for yeast, clue cells, or Trichomonas. She is given prescription for Percocet for pain as well as the above-noted prescription for doxycycline.  Dione Booze, MD 09/07/13 [phone]

## 2013-09-08 LAB — GC/CHLAMYDIA PROBE AMP
CT Probe RNA: NEGATIVE
GC Probe RNA: NEGATIVE

## 2013-10-01 ENCOUNTER — Encounter (HOSPITAL_COMMUNITY): Payer: Self-pay | Admitting: Emergency Medicine

## 2013-10-01 DIAGNOSIS — G8929 Other chronic pain: Secondary | ICD-10-CM | POA: Insufficient documentation

## 2013-10-01 DIAGNOSIS — F17200 Nicotine dependence, unspecified, uncomplicated: Secondary | ICD-10-CM | POA: Insufficient documentation

## 2013-10-01 LAB — COMPREHENSIVE METABOLIC PANEL
ALK PHOS: 72 U/L (ref 39–117)
ALT: 18 U/L (ref 0–35)
AST: 22 U/L (ref 0–37)
Albumin: 4 g/dL (ref 3.5–5.2)
BUN: 17 mg/dL (ref 6–23)
CO2: 24 meq/L (ref 19–32)
Calcium: 9.2 mg/dL (ref 8.4–10.5)
Chloride: 105 mEq/L (ref 96–112)
Creatinine, Ser: 0.73 mg/dL (ref 0.50–1.10)
Glucose, Bld: 83 mg/dL (ref 70–99)
POTASSIUM: 4 meq/L (ref 3.7–5.3)
SODIUM: 141 meq/L (ref 137–147)
Total Bilirubin: 0.3 mg/dL (ref 0.3–1.2)
Total Protein: 7.4 g/dL (ref 6.0–8.3)

## 2013-10-01 LAB — URINALYSIS, ROUTINE W REFLEX MICROSCOPIC
BILIRUBIN URINE: NEGATIVE
Glucose, UA: NEGATIVE mg/dL
HGB URINE DIPSTICK: NEGATIVE
KETONES UR: NEGATIVE mg/dL
Leukocytes, UA: NEGATIVE
NITRITE: NEGATIVE
PROTEIN: NEGATIVE mg/dL
SPECIFIC GRAVITY, URINE: 1.031 — AB (ref 1.005–1.030)
Urobilinogen, UA: 0.2 mg/dL (ref 0.0–1.0)
pH: 5 (ref 5.0–8.0)

## 2013-10-01 LAB — POCT PREGNANCY, URINE: Preg Test, Ur: NEGATIVE

## 2013-10-01 LAB — CBC WITH DIFFERENTIAL/PLATELET
Basophils Absolute: 0 10*3/uL (ref 0.0–0.1)
Basophils Relative: 0 % (ref 0–1)
EOS ABS: 0.3 10*3/uL (ref 0.0–0.7)
EOS PCT: 6 % — AB (ref 0–5)
HCT: 36.7 % (ref 36.0–46.0)
Hemoglobin: 12.3 g/dL (ref 12.0–15.0)
LYMPHS ABS: 1.7 10*3/uL (ref 0.7–4.0)
LYMPHS PCT: 31 % (ref 12–46)
MCH: 30.7 pg (ref 26.0–34.0)
MCHC: 33.5 g/dL (ref 30.0–36.0)
MCV: 91.5 fL (ref 78.0–100.0)
MONOS PCT: 9 % (ref 3–12)
Monocytes Absolute: 0.5 10*3/uL (ref 0.1–1.0)
Neutro Abs: 2.9 10*3/uL (ref 1.7–7.7)
Neutrophils Relative %: 54 % (ref 43–77)
Platelets: 244 10*3/uL (ref 150–400)
RBC: 4.01 MIL/uL (ref 3.87–5.11)
RDW: 13.2 % (ref 11.5–15.5)
WBC: 5.4 10*3/uL (ref 4.0–10.5)

## 2013-10-01 LAB — LIPASE, BLOOD: Lipase: 22 U/L (ref 11–59)

## 2013-10-01 NOTE — ED Notes (Signed)
Pt called for room. No answer x1 

## 2013-10-01 NOTE — ED Notes (Signed)
Pt states she has beenh aving nausea and vomiting four time per day for the past 5 days.  Pt states she is also having right flank pain and some pain on the left with movement.

## 2013-10-02 ENCOUNTER — Emergency Department (HOSPITAL_COMMUNITY)
Admission: EM | Admit: 2013-10-02 | Discharge: 2013-10-02 | Discharge status: Against Medical Advice | Payer: Medicare Other | Attending: Emergency Medicine | Admitting: Emergency Medicine

## 2013-10-02 ENCOUNTER — Encounter (HOSPITAL_COMMUNITY): Payer: Self-pay | Admitting: Emergency Medicine

## 2013-10-02 ENCOUNTER — Emergency Department (HOSPITAL_COMMUNITY)
Admission: EM | Admit: 2013-10-02 | Discharge: 2013-10-02 | Disposition: A | Discharge status: Home / Self Care | Payer: Medicare Other | Attending: Emergency Medicine | Admitting: Emergency Medicine

## 2013-10-02 DIAGNOSIS — Z3202 Encounter for pregnancy test, result negative: Secondary | ICD-10-CM | POA: Insufficient documentation

## 2013-10-02 DIAGNOSIS — R0789 Other chest pain: Secondary | ICD-10-CM

## 2013-10-02 DIAGNOSIS — R112 Nausea with vomiting, unspecified: Secondary | ICD-10-CM

## 2013-10-02 DIAGNOSIS — F17200 Nicotine dependence, unspecified, uncomplicated: Secondary | ICD-10-CM | POA: Insufficient documentation

## 2013-10-02 DIAGNOSIS — Z792 Long term (current) use of antibiotics: Secondary | ICD-10-CM | POA: Insufficient documentation

## 2013-10-02 DIAGNOSIS — R109 Unspecified abdominal pain: Secondary | ICD-10-CM

## 2013-10-02 DIAGNOSIS — Z87442 Personal history of urinary calculi: Secondary | ICD-10-CM | POA: Insufficient documentation

## 2013-10-02 DIAGNOSIS — G8929 Other chronic pain: Secondary | ICD-10-CM | POA: Insufficient documentation

## 2013-10-02 DIAGNOSIS — Z9089 Acquired absence of other organs: Secondary | ICD-10-CM | POA: Insufficient documentation

## 2013-10-02 DIAGNOSIS — Z91040 Latex allergy status: Secondary | ICD-10-CM | POA: Insufficient documentation

## 2013-10-02 DIAGNOSIS — Z862 Personal history of diseases of the blood and blood-forming organs and certain disorders involving the immune mechanism: Secondary | ICD-10-CM | POA: Insufficient documentation

## 2013-10-02 DIAGNOSIS — R071 Chest pain on breathing: Secondary | ICD-10-CM | POA: Insufficient documentation

## 2013-10-02 DIAGNOSIS — Z880 Allergy status to penicillin: Secondary | ICD-10-CM | POA: Insufficient documentation

## 2013-10-02 MED ORDER — DICYCLOMINE HCL 20 MG PO TABS: 20.0000 mg | ORAL_TABLET | Freq: Four times a day (QID) | ORAL | Status: DC | PRN

## 2013-10-02 MED ORDER — DICYCLOMINE HCL 10 MG/ML IM SOLN
20.0000 mg | Freq: Once | INTRAMUSCULAR | Status: AC
  Administered 2013-10-02: 20 mg via INTRAMUSCULAR
  Filled 2013-10-02: qty 2

## 2013-10-02 MED ORDER — PROMETHAZINE HCL 25 MG/ML IJ SOLN
25.0000 mg | Freq: Once | INTRAMUSCULAR | Status: AC
  Administered 2013-10-02: 25 mg via INTRAMUSCULAR
  Filled 2013-10-02: qty 1

## 2013-10-02 MED ORDER — PROMETHAZINE HCL 25 MG RE SUPP: 25.0000 mg | Freq: Four times a day (QID) | RECTAL | Status: DC | PRN

## 2013-10-02 MED ORDER — PROMETHAZINE HCL 25 MG PO TABS: 25.0000 mg | ORAL_TABLET | Freq: Four times a day (QID) | ORAL | Status: DC | PRN

## 2013-10-02 NOTE — ED Provider Notes (Signed)
CSN: [id number]     Arrival date & time 10/02/13  0210 History   First MD Initiated Contact with Patient 10/02/13 0216     Chief Complaint  Patient presents with  . Abdominal Pain   (Consider location/radiation/quality/duration/timing/severity/associated sxs/prior Treatment) HPI 28 yo female presents to the ER from home with complaint of abdominal pain, n/v.  Sxs started with right sided pain, from axilla to hip and progressed to n/v.  Normal bm, no diarrhea, no fever.  Pt also c/o suprapubic pain, and sharp pain with urination.  No trauma to her right side, no new activities.  Pain in right side worse with palpation and movement.  Pt reports taking tylenol and vicodin 5 days ago without improvement.  She reports she has been unable to keep down anything in 5 days.  Pt has h/o kidney stones, chronic abd pain.  She reports her current pain is somewhat like her prior episodes of abd pain. Past Medical History  Diagnosis Date  . Kidney stones   . Anemia   . Chronic abdominal pain    Past Surgical History  Procedure Laterality Date  . Cholecystectomy     No family history on file. History  Substance Use Topics  . Smoking status: Current Every Day Smoker    Types: Cigarettes  . Smokeless tobacco: Not on file  . Alcohol Use: No   OB History   Grav Para Term Preterm Abortions TAB SAB Ect Mult Living                 Review of Systems  All other systems reviewed and are negative.    Allergies  Compazine; Donnatal; Haldol; Latex; Lidocaine; Maalox; Mushroom extract complex; Orange fruit; Other; Penicillins; Reglan; Toradol; Tramadol; and Zofran  Home Medications   Current Outpatient Rx  Name  Route  Sig  Dispense  Refill  . acetaminophen (TYLENOL) 500 MG tablet   Oral   Take 500 mg by mouth every 6 (six) hours as needed for mild pain or moderate pain.         Marland Kitchen doxycycline (VIBRAMYCIN) 100 MG capsule   Oral   Take 1 capsule (100 mg total) by mouth 2 (two) times daily.   20  capsule   0   . HYDROcodone-acetaminophen (NORCO/VICODIN) 5-325 MG per tablet   Oral   Take 1 tablet by mouth every 6 (six) hours as needed for moderate pain.         Marland Kitchen oxyCODONE-acetaminophen (PERCOCET) 5-325 MG per tablet   Oral   Take 1 tablet by mouth every 4 (four) hours as needed.   12 tablet   0   . promethazine (PHENERGAN) 25 MG tablet   Oral   Take 1 tablet (25 mg total) by mouth every 6 (six) hours as needed for nausea or vomiting.   12 tablet   0    BP 113/64  Pulse 73  Temp(Src) 98 F (36.7 C) (Oral)  Resp 18  SpO2 100%  LMP 09/09/2013 Physical Exam  Nursing note and vitals reviewed. Constitutional: She is oriented to person, place, and time. She appears well-developed and well-nourished.  HENT:  Head: Normocephalic and atraumatic.  Nose: Nose normal.  Mouth/Throat: Oropharynx is clear and moist.  Eyes: Conjunctivae and EOM are normal. Pupils are equal, round, and reactive to light.  Neck: Normal range of motion. Neck supple. No JVD present. No tracheal deviation present. No thyromegaly present.  Cardiovascular: Normal rate, regular rhythm, normal heart sounds and intact distal  pulses.  Exam reveals no gallop and no friction rub.   No murmur heard. Pulmonary/Chest: Effort normal and breath sounds normal. No stridor. No respiratory distress. She has no wheezes. She has no rales. She exhibits no tenderness.  Abdominal: Soft. Bowel sounds are normal. She exhibits no distension and no mass. There is tenderness (mild diffuse pain, distractable, no rebound or guarding.  Able to move about easily on bed). There is no rebound and no guarding.  Musculoskeletal: Normal range of motion. She exhibits no edema and no tenderness.  ttp over right side from axilla to hip.  No overlying skin changes, no masses, no acute abn.  Pain with deep palpation   Lymphadenopathy:    She has no cervical adenopathy.  Neurological: She is alert and oriented to person, place, and time. She  exhibits normal muscle tone. Coordination normal.  Skin: Skin is warm and dry. No rash noted. No erythema. No pallor.  Psychiatric: She has a normal mood and affect. Her behavior is normal. Judgment and thought content normal.    ED Course  Procedures (including critical care time) Labs Review Labs Reviewed - No data to display Imaging Review No results found. Results for orders placed during the hospital encounter of 10/02/13  CBC WITH DIFFERENTIAL      Result Value Range   WBC 5.4  4.0 - 10.5 K/uL   RBC 4.01  3.87 - 5.11 MIL/uL   Hemoglobin 12.3  12.0 - 15.0 g/dL   HCT 16.1  09.6 - 04.5 %   MCV 91.5  78.0 - 100.0 fL   MCH 30.7  26.0 - 34.0 pg   MCHC 33.5  30.0 - 36.0 g/dL   RDW 40.9  81.1 - 91.4 %   Platelets 244  150 - 400 K/uL   Neutrophils Relative % 54  43 - 77 %   Neutro Abs 2.9  1.7 - 7.7 K/uL   Lymphocytes Relative 31  12 - 46 %   Lymphs Abs 1.7  0.7 - 4.0 K/uL   Monocytes Relative 9  3 - 12 %   Monocytes Absolute 0.5  0.1 - 1.0 K/uL   Eosinophils Relative 6 (*) 0 - 5 %   Eosinophils Absolute 0.3  0.0 - 0.7 K/uL   Basophils Relative 0  0 - 1 %   Basophils Absolute 0.0  0.0 - 0.1 K/uL  COMPREHENSIVE METABOLIC PANEL      Result Value Range   Sodium 141  137 - 147 mEq/L   Potassium 4.0  3.7 - 5.3 mEq/L   Chloride 105  96 - 112 mEq/L   CO2 24  19 - 32 mEq/L   Glucose, Bld 83  70 - 99 mg/dL   BUN 17  6 - 23 mg/dL   Creatinine, Ser 7.82  0.50 - 1.10 mg/dL   Calcium 9.2  8.4 - 95.6 mg/dL   Total Protein 7.4  6.0 - 8.3 g/dL   Albumin 4.0  3.5 - 5.2 g/dL   AST 22  0 - 37 U/L   ALT 18  0 - 35 U/L   Alkaline Phosphatase 72  39 - 117 U/L   Total Bilirubin 0.3  0.3 - 1.2 mg/dL   GFR calc non Af Amer >90  >90 mL/min   GFR calc Af Amer >90  >90 mL/min  LIPASE, BLOOD      Result Value Range   Lipase 22  11 - 59 U/L  URINALYSIS, ROUTINE W REFLEX MICROSCOPIC  Result Value Range   Color, Urine YELLOW  YELLOW   APPearance CLEAR  CLEAR   Specific Gravity, Urine 1.031  (*) 1.005 - 1.030   pH 5.0  5.0 - 8.0   Glucose, UA NEGATIVE  NEGATIVE mg/dL   Hgb urine dipstick NEGATIVE  NEGATIVE   Bilirubin Urine NEGATIVE  NEGATIVE   Ketones, ur NEGATIVE  NEGATIVE mg/dL   Protein, ur NEGATIVE  NEGATIVE mg/dL   Urobilinogen, UA 0.2  0.0 - 1.0 mg/dL   Nitrite NEGATIVE  NEGATIVE   Leukocytes, UA NEGATIVE  NEGATIVE  POCT PREGNANCY, URINE      Result Value Range   Preg Test, Ur NEGATIVE  NEGATIVE   No results found.   EKG Interpretation   None       MDM   1. Abdominal pain   2. Nausea and vomiting   3. Right-sided chest wall pain    28 yo female with reported 5 days of n/v, right sided pain.  Exam unremarkable, labs normal, no signs of dehydration.  Plan for IM phenergan and bentyl, and referral to gi/pcp for further workup of chronic abd pain.    Olivia Mackielga M Tiarra Anastacio, MD 10/02/13 [phone]

## 2013-10-02 NOTE — ED Notes (Signed)
Pt called for room. No answer x 3 

## 2013-10-02 NOTE — Discharge Instructions (Signed)
Abdominal Pain, Adult °Many things can cause abdominal pain. Usually, abdominal pain is not caused by a disease and will improve without treatment. It can often be observed and treated at home. Your health care provider will do a physical exam and possibly order blood tests and X-rays to help determine the seriousness of your pain. However, in many cases, more time must pass before a clear cause of the pain can be found. Before that point, your health care provider may not know if you need more testing or further treatment. °HOME CARE INSTRUCTIONS  °Monitor your abdominal pain for any changes. The following actions may help to alleviate any discomfort you are experiencing: °· Only take over-the-counter or prescription medicines as directed by your health care provider. °· Do not take laxatives unless directed to do so by your health care provider. °· Try a clear liquid diet (broth, tea, or water) as directed by your health care provider. Slowly move to a bland diet as tolerated. °SEEK MEDICAL CARE IF: °· You have unexplained abdominal pain. °· You have abdominal pain associated with nausea or diarrhea. °· You have pain when you urinate or have a bowel movement. °· You experience abdominal pain that wakes you in the night. °· You have abdominal pain that is worsened or improved by eating food. °· You have abdominal pain that is worsened with eating fatty foods. °SEEK IMMEDIATE MEDICAL CARE IF:  °· Your pain does not go away within 2 hours. °· You have a fever. °· You keep throwing up (vomiting). °· Your pain is felt only in portions of the abdomen, such as the right side or the left lower portion of the abdomen. °· You pass bloody or black tarry stools. °MAKE SURE YOU: °· Understand these instructions.   °· Will watch your condition.   °· Will get help right away if you are not doing well or get worse.   °Document Released: 06/06/2005 Document Revised: 06/17/2013 Document Reviewed: 05/06/2013 °ExitCare® Patient  Information ©2014 [employer]. ° °Nausea and Vomiting °Nausea is a sick feeling that often comes before throwing up (vomiting). Vomiting is a reflex where stomach contents come out of your mouth. Vomiting can cause severe loss of body fluids (dehydration). Children and elderly adults can become dehydrated quickly, especially if they also have diarrhea. Nausea and vomiting are symptoms of a condition or disease. It is important to find the cause of your symptoms. °CAUSES  °· Direct irritation of the stomach lining. This irritation can result from increased acid production (gastroesophageal reflux disease), infection, food poisoning, taking certain medicines (such as nonsteroidal anti-inflammatory drugs), alcohol use, or tobacco use. °· Signals from the brain. These signals could be caused by a headache, heat exposure, an inner ear disturbance, increased pressure in the brain from injury, infection, a tumor, or a concussion, pain, emotional stimulus, or metabolic problems. °· An obstruction in the gastrointestinal tract (bowel obstruction). °· Illnesses such as diabetes, hepatitis, gallbladder problems, appendicitis, kidney problems, cancer, sepsis, atypical symptoms of a heart attack, or eating disorders. °· Medical treatments such as chemotherapy and radiation. °· Receiving medicine that makes you sleep (general anesthetic) during surgery. °DIAGNOSIS °Your caregiver may ask for tests to be done if the problems do not improve after a few days. Tests may also be done if symptoms are severe or if the reason for the nausea and vomiting is not clear. Tests may include: °· Urine tests. °· Blood tests. °· Stool tests. °· Cultures (to look for evidence of infection). °· X-rays or   other imaging studies. °Test results can help your caregiver make decisions about treatment or the need for additional tests. °TREATMENT °You need to stay well hydrated. Drink frequently but in small amounts. You may wish to drink water, sports  drinks, clear broth, or eat frozen ice pops or gelatin dessert to help stay hydrated. When you eat, eating slowly may help prevent nausea. There are also some antinausea medicines that may help prevent nausea. °HOME CARE INSTRUCTIONS  °· Take all medicine as directed by your caregiver. °· If you do not have an appetite, do not force yourself to eat. However, you must continue to drink fluids. °· If you have an appetite, eat a normal diet unless your caregiver tells you differently. °· Eat a variety of complex carbohydrates (rice, wheat, potatoes, bread), lean meats, yogurt, fruits, and vegetables. °· Avoid high-fat foods because they are more difficult to digest. °· Drink enough water and fluids to keep your urine clear or pale yellow. °· If you are dehydrated, ask your caregiver for specific rehydration instructions. Signs of dehydration may include: °· Severe thirst. °· Dry lips and mouth. °· Dizziness. °· Dark urine. °· Decreasing urine frequency and amount. °· Confusion. °· Rapid breathing or pulse. °SEEK IMMEDIATE MEDICAL CARE IF:  °· You have blood or brown flecks (like coffee grounds) in your vomit. °· You have black or bloody stools. °· You have a severe headache or stiff neck. °· You are confused. °· You have severe abdominal pain. °· You have chest pain or trouble breathing. °· You do not urinate at least once every 8 hours. °· You develop cold or clammy skin. °· You continue to vomit for longer than 24 to 48 hours. °· You have a fever. °MAKE SURE YOU:  °· Understand these instructions. °· Will watch your condition. °· Will get help right away if you are not doing well or get worse. °Document Released: 08/27/2005 Document Revised: 11/19/2011 Document Reviewed: 01/24/2011 °ExitCare® Patient Information ©2014 [employer]. ° °

## 2013-10-02 NOTE — ED Notes (Signed)
Hurts to urinate; rt. Sided abd. Pain, radiating from rt. Armpit to lower abd. N/v. Duration x 5 days. No abnormal vaginal d/c. Rt. Upper back hurts with movement.  Tried to take tylenol but didn't help; took Vicodin 6 hours later with no relief.

## 2013-10-02 NOTE — ED Notes (Signed)
Pt called for from. No answer x 2

## 2013-10-03 ENCOUNTER — Emergency Department (HOSPITAL_COMMUNITY)
Admission: EM | Admit: 2013-10-03 | Discharge: 2013-10-04 | Disposition: A | Discharge status: Home / Self Care | Payer: Medicare Other | Attending: Emergency Medicine | Admitting: Emergency Medicine

## 2013-10-03 ENCOUNTER — Encounter (HOSPITAL_COMMUNITY): Payer: Self-pay | Admitting: Emergency Medicine

## 2013-10-03 DIAGNOSIS — Z87442 Personal history of urinary calculi: Secondary | ICD-10-CM | POA: Insufficient documentation

## 2013-10-03 DIAGNOSIS — Z3202 Encounter for pregnancy test, result negative: Secondary | ICD-10-CM | POA: Insufficient documentation

## 2013-10-03 DIAGNOSIS — Z880 Allergy status to penicillin: Secondary | ICD-10-CM | POA: Insufficient documentation

## 2013-10-03 DIAGNOSIS — F17200 Nicotine dependence, unspecified, uncomplicated: Secondary | ICD-10-CM | POA: Insufficient documentation

## 2013-10-03 DIAGNOSIS — Z91040 Latex allergy status: Secondary | ICD-10-CM | POA: Insufficient documentation

## 2013-10-03 DIAGNOSIS — Z862 Personal history of diseases of the blood and blood-forming organs and certain disorders involving the immune mechanism: Secondary | ICD-10-CM | POA: Insufficient documentation

## 2013-10-03 DIAGNOSIS — R1032 Left lower quadrant pain: Secondary | ICD-10-CM | POA: Insufficient documentation

## 2013-10-03 DIAGNOSIS — R109 Unspecified abdominal pain: Secondary | ICD-10-CM

## 2013-10-03 DIAGNOSIS — R1031 Right lower quadrant pain: Secondary | ICD-10-CM | POA: Insufficient documentation

## 2013-10-03 DIAGNOSIS — Z9089 Acquired absence of other organs: Secondary | ICD-10-CM | POA: Insufficient documentation

## 2013-10-03 DIAGNOSIS — G8929 Other chronic pain: Secondary | ICD-10-CM | POA: Insufficient documentation

## 2013-10-03 NOTE — ED Notes (Signed)
Presents with chronic abdominal pain and nausea. Pain is described as sharp and stabbing in lower abdomen, seen here multiple times for the same, last date was 10/02/13, reports "bentyl did not help me last time and I am sick and tired of taking medications that do not help me, like tylenol and Ibuprofen. i didn't have enough to get the phenergan and I won't use it because it is a suppository. They tell me to come back if my symptoms get worse and then they give an attitude about coming back because my symptoms are worse. Something happened to my primary care doctor and i don't have one. I am supposed to be taking flexeril for my back and I am currently out of that and I am suuposed to take Ambien for sleep and I am currently out of that"

## 2013-10-04 LAB — COMPREHENSIVE METABOLIC PANEL
ALK PHOS: 65 U/L (ref 39–117)
ALT: 16 U/L (ref 0–35)
AST: 22 U/L (ref 0–37)
Albumin: 3.7 g/dL (ref 3.5–5.2)
BUN: 16 mg/dL (ref 6–23)
CO2: 25 mEq/L (ref 19–32)
CREATININE: 0.72 mg/dL (ref 0.50–1.10)
Calcium: 9.2 mg/dL (ref 8.4–10.5)
Chloride: 104 mEq/L (ref 96–112)
GFR calc Af Amer: >90 mL/min (ref >90)
GFR calc non Af Amer: >90 mL/min (ref >90)
Glucose, Bld: 80 mg/dL (ref 70–99)
Potassium: 4.1 mEq/L (ref 3.7–5.3)
Sodium: 141 mEq/L (ref 137–147)
TOTAL PROTEIN: 7.1 g/dL (ref 6.0–8.3)
Total Bilirubin: <0.2 mg/dL — ABNORMAL LOW (ref 0.3–1.2)

## 2013-10-04 LAB — CBC WITH DIFFERENTIAL/PLATELET
Basophils Absolute: 0 10*3/uL (ref 0.0–0.1)
Basophils Relative: 0 % (ref 0–1)
EOS ABS: 0.2 10*3/uL (ref 0.0–0.7)
Eosinophils Relative: 5 % (ref 0–5)
HEMATOCRIT: 35.2 % — AB (ref 36.0–46.0)
HEMOGLOBIN: 12.1 g/dL (ref 12.0–15.0)
LYMPHS ABS: 1.6 10*3/uL (ref 0.7–4.0)
Lymphocytes Relative: 33 % (ref 12–46)
MCH: 31.1 pg (ref 26.0–34.0)
MCHC: 34.4 g/dL (ref 30.0–36.0)
MCV: 90.5 fL (ref 78.0–100.0)
MONO ABS: 0.5 10*3/uL (ref 0.1–1.0)
MONOS PCT: 10 % (ref 3–12)
NEUTROS PCT: 52 % (ref 43–77)
Neutro Abs: 2.6 10*3/uL (ref 1.7–7.7)
Platelets: 250 10*3/uL (ref 150–400)
RBC: 3.89 MIL/uL (ref 3.87–5.11)
RDW: 13.2 % (ref 11.5–15.5)
WBC: 4.9 10*3/uL (ref 4.0–10.5)

## 2013-10-04 LAB — URINALYSIS, ROUTINE W REFLEX MICROSCOPIC
BILIRUBIN URINE: NEGATIVE
Glucose, UA: NEGATIVE mg/dL
HGB URINE DIPSTICK: NEGATIVE
Ketones, ur: NEGATIVE mg/dL
LEUKOCYTES UA: NEGATIVE
Nitrite: NEGATIVE
PH: 7 (ref 5.0–8.0)
PROTEIN: NEGATIVE mg/dL
Specific Gravity, Urine: 1.026 (ref 1.005–1.030)
Urobilinogen, UA: 0.2 mg/dL (ref 0.0–1.0)

## 2013-10-04 LAB — URINE MICROSCOPIC-ADD ON

## 2013-10-04 LAB — PREGNANCY, URINE: Preg Test, Ur: NEGATIVE

## 2013-10-04 MED ORDER — DICYCLOMINE HCL 10 MG/ML IM SOLN
20.0000 mg | Freq: Once | INTRAMUSCULAR | Status: AC
  Administered 2013-10-04: 20 mg via INTRAMUSCULAR
  Filled 2013-10-04: qty 2

## 2013-10-04 MED ORDER — SODIUM CHLORIDE 0.9 % IV BOLUS (SEPSIS)
1000.0000 mL | Freq: Once | INTRAVENOUS | Status: AC
  Administered 2013-10-04: 1000 mL via INTRAVENOUS

## 2013-10-04 NOTE — Discharge Instructions (Signed)
Fill your prescription for Phenergan and take this as needed for nausea.  Tylenol 1000 mg rotated with Motrin 600 mg every 4 hours as needed for pain.  Followup with a primary Dr. to discuss your situation and make possible referrals to specialists.   Abdominal Pain, Adult Many things can cause abdominal pain. Usually, abdominal pain is not caused by a disease and will improve without treatment. It can often be observed and treated at home. Your health care provider will do a physical exam and possibly order blood tests and X-rays to help determine the seriousness of your pain. However, in many cases, more time must pass before a clear cause of the pain can be found. Before that point, your health care provider may not know if you need more testing or further treatment. HOME CARE INSTRUCTIONS  Monitor your abdominal pain for any changes. The following actions may help to alleviate any discomfort you are experiencing:  Only take over-the-counter or prescription medicines as directed by your health care provider.  Do not take laxatives unless directed to do so by your health care provider.  Try a clear liquid diet (broth, tea, or water) as directed by your health care provider. Slowly move to a bland diet as tolerated. SEEK MEDICAL CARE IF:  You have unexplained abdominal pain.  You have abdominal pain associated with nausea or diarrhea.  You have pain when you urinate or have a bowel movement.  You experience abdominal pain that wakes you in the night.  You have abdominal pain that is worsened or improved by eating food.  You have abdominal pain that is worsened with eating fatty foods. SEEK IMMEDIATE MEDICAL CARE IF:   Your pain does not go away within 2 hours.  You have a fever.  You keep throwing up (vomiting).  Your pain is felt only in portions of the abdomen, such as the right side or the left lower portion of the abdomen.  You pass bloody or black tarry stools. MAKE  SURE YOU:  Understand these instructions.   Will watch your condition.   Will get help right away if you are not doing well or get worse.  Document Released: 06/06/2005 Document Revised: 06/17/2013 Document Reviewed: 05/06/2013 [facility] Patient Information 2014 [place], [place].

## 2013-10-04 NOTE — ED Provider Notes (Signed)
CSN: [id number]     Arrival date & time 10/03/13  2246 History   First MD Initiated Contact with Patient 10/03/13 2358     Chief Complaint  Patient presents with  . Abdominal Pain   (Consider location/radiation/quality/duration/timing/severity/associated sxs/prior Treatment) HPI Comments: Patient is a 28 year old female with history of chronic abdominal pain. This is been ongoing for many months. She states it has been worse over the past 10 days and has been seen here many times for this. Most recently she was here 2 days ago and workup was unremarkable and she was discharged to home. She states since that time she has been unable to fill the prescription for Phenergan she was given an continues to have pain and nausea.  Patient is a 28 y.o. female presenting with abdominal pain. The history is provided by the patient.  Abdominal Pain Pain location:  LLQ and RLQ Pain quality: cramping   Pain radiates to:  Does not radiate Pain severity:  Moderate Onset quality:  Gradual Duration:  10 days Timing:  Constant Progression:  Worsening Chronicity:  Recurrent Relieved by:  Nothing Worsened by:  Movement and palpation Ineffective treatments:  None tried   Past Medical History  Diagnosis Date  . Kidney stones   . Anemia   . Chronic abdominal pain    Past Surgical History  Procedure Laterality Date  . Cholecystectomy     History reviewed. No pertinent family history. History  Substance Use Topics  . Smoking status: Current Every Day Smoker    Types: Cigarettes  . Smokeless tobacco: Not on file  . Alcohol Use: No   OB History   Grav Para Term Preterm Abortions TAB SAB Ect Mult Living                 Review of Systems  Gastrointestinal: Positive for abdominal pain.  All other systems reviewed and are negative.    Allergies  Compazine; Donnatal; Haldol; Latex; Lidocaine; Maalox; Mushroom extract complex; Orange fruit; Other; Penicillins; Reglan; Toradol; Tramadol; and  Zofran  Home Medications   Current Outpatient Rx  Name  Route  Sig  Dispense  Refill  . acetaminophen (TYLENOL) 500 MG tablet   Oral   Take 500 mg by mouth every 6 (six) hours as needed for mild pain or moderate pain.         Marland Kitchen dicyclomine (BENTYL) 20 MG tablet   Oral   Take 1 tablet (20 mg total) by mouth every 6 (six) hours as needed for spasms (for abdominal cramping).   20 tablet   0   . doxycycline (VIBRAMYCIN) 100 MG capsule   Oral   Take 1 capsule (100 mg total) by mouth 2 (two) times daily.   20 capsule   0   . HYDROcodone-acetaminophen (NORCO/VICODIN) 5-325 MG per tablet   Oral   Take 1 tablet by mouth every 6 (six) hours as needed for moderate pain.         Marland Kitchen oxyCODONE-acetaminophen (PERCOCET) 5-325 MG per tablet   Oral   Take 1 tablet by mouth every 4 (four) hours as needed.   12 tablet   0   . promethazine (PHENERGAN) 25 MG suppository   Rectal   Place 1 suppository (25 mg total) rectally every 6 (six) hours as needed for nausea or vomiting.   12 each   0   . promethazine (PHENERGAN) 25 MG tablet   Oral   Take 1 tablet (25 mg total) by mouth every 6 (  six) hours as needed for nausea or vomiting.   12 tablet   0   . promethazine (PHENERGAN) 25 MG tablet   Oral   Take 1 tablet (25 mg total) by mouth every 6 (six) hours as needed for nausea.   30 tablet   0    BP 112/75  Pulse 90  Temp(Src) 97.5 F (36.4 C) (Oral)  Resp 17  Wt 174 lb 2 oz (78.983 kg)  SpO2 96%  LMP 09/09/2013 Physical Exam  Nursing note and vitals reviewed. Constitutional: She is oriented to person, place, and time. She appears well-developed and well-nourished. No distress.  HENT:  Head: Normocephalic and atraumatic.  Neck: Normal range of motion. Neck supple.  Cardiovascular: Normal rate and regular rhythm.  Exam reveals no gallop and no friction rub.   No murmur heard. Pulmonary/Chest: Effort normal and breath sounds normal. No respiratory distress. She has no wheezes.   Abdominal: Soft. Bowel sounds are normal. She exhibits no distension and no mass. There is tenderness. There is no rebound and no guarding.  There is tenderness to palpation across the lower abdomen in the right lower, left lower, and suprapubic regions. There is no rebound and no guarding.  Musculoskeletal: Normal range of motion.  Neurological: She is alert and oriented to person, place, and time.  Skin: Skin is warm and dry. She is not diaphoretic.    ED Course  Procedures (including critical care time) Labs Review Labs Reviewed  URINALYSIS, ROUTINE W REFLEX MICROSCOPIC  PREGNANCY, URINE  CBC WITH DIFFERENTIAL  COMPREHENSIVE METABOLIC PANEL   Imaging Review No results found.    MDM  No diagnosis found. Patient is a 28 year old female with long-standing history of unexplained abdominal pain. She has been here multiple times for the same. Imaging studies thus far been unremarkable. Her abdominal exam tonight is unremarkable and repeat laboratory studies are all within normal limits. I see no reasons to perform further testing at this time. I will devise her to followup with a primary Dr. to discuss her situation and make possible referrals. She has a prescription she was given for Phenergan 2 days ago when she was here and I've advised her to fill this.    Geoffery Lyonsouglas Tylee Newby, MD 10/04/13 [phone]

## 2013-11-24 ENCOUNTER — Encounter (HOSPITAL_COMMUNITY): Payer: Self-pay | Admitting: Emergency Medicine

## 2013-11-24 DIAGNOSIS — Z862 Personal history of diseases of the blood and blood-forming organs and certain disorders involving the immune mechanism: Secondary | ICD-10-CM | POA: Insufficient documentation

## 2013-11-24 DIAGNOSIS — Z880 Allergy status to penicillin: Secondary | ICD-10-CM | POA: Insufficient documentation

## 2013-11-24 DIAGNOSIS — F17200 Nicotine dependence, unspecified, uncomplicated: Secondary | ICD-10-CM | POA: Insufficient documentation

## 2013-11-24 DIAGNOSIS — Z91040 Latex allergy status: Secondary | ICD-10-CM | POA: Insufficient documentation

## 2013-11-24 DIAGNOSIS — R1110 Vomiting, unspecified: Secondary | ICD-10-CM | POA: Insufficient documentation

## 2013-11-24 DIAGNOSIS — R1084 Generalized abdominal pain: Secondary | ICD-10-CM | POA: Insufficient documentation

## 2013-11-24 DIAGNOSIS — Z87442 Personal history of urinary calculi: Secondary | ICD-10-CM | POA: Insufficient documentation

## 2013-11-24 DIAGNOSIS — G8929 Other chronic pain: Secondary | ICD-10-CM | POA: Insufficient documentation

## 2013-11-24 DIAGNOSIS — Z3202 Encounter for pregnancy test, result negative: Secondary | ICD-10-CM | POA: Insufficient documentation

## 2013-11-24 NOTE — ED Notes (Signed)
Pt. reports intermittent low abdominal pain with nausea and vomitting for 1 week , denies fever ,chills or diarrhea.

## 2013-11-25 ENCOUNTER — Emergency Department (HOSPITAL_COMMUNITY)
Admission: EM | Admit: 2013-11-25 | Discharge: 2013-11-25 | Disposition: A | Discharge status: Home / Self Care | Payer: Medicare Other | Attending: Emergency Medicine | Admitting: Emergency Medicine

## 2013-11-25 DIAGNOSIS — G8929 Other chronic pain: Secondary | ICD-10-CM

## 2013-11-25 DIAGNOSIS — R1110 Vomiting, unspecified: Secondary | ICD-10-CM

## 2013-11-25 DIAGNOSIS — R109 Unspecified abdominal pain: Secondary | ICD-10-CM

## 2013-11-25 LAB — URINALYSIS, ROUTINE W REFLEX MICROSCOPIC
BILIRUBIN URINE: NEGATIVE
Glucose, UA: NEGATIVE mg/dL
HGB URINE DIPSTICK: NEGATIVE
KETONES UR: NEGATIVE mg/dL
Leukocytes, UA: NEGATIVE
Nitrite: NEGATIVE
PH: 5.5 (ref 5.0–8.0)
Protein, ur: NEGATIVE mg/dL
SPECIFIC GRAVITY, URINE: 1.033 — AB (ref 1.005–1.030)
Urobilinogen, UA: 0.2 mg/dL (ref 0.0–1.0)

## 2013-11-25 LAB — CBC WITH DIFFERENTIAL/PLATELET
BASOS PCT: 0 % (ref 0–1)
Basophils Absolute: 0 10*3/uL (ref 0.0–0.1)
Eosinophils Absolute: 0.3 10*3/uL (ref 0.0–0.7)
Eosinophils Relative: 4 % (ref 0–5)
HCT: 37.7 % (ref 36.0–46.0)
HEMOGLOBIN: 12.9 g/dL (ref 12.0–15.0)
LYMPHS ABS: 2.1 10*3/uL (ref 0.7–4.0)
Lymphocytes Relative: 32 % (ref 12–46)
MCH: 30.7 pg (ref 26.0–34.0)
MCHC: 34.2 g/dL (ref 30.0–36.0)
MCV: 89.8 fL (ref 78.0–100.0)
MONOS PCT: 8 % (ref 3–12)
Monocytes Absolute: 0.5 10*3/uL (ref 0.1–1.0)
NEUTROS ABS: 3.7 10*3/uL (ref 1.7–7.7)
Neutrophils Relative %: 56 % (ref 43–77)
PLATELETS: 225 10*3/uL (ref 150–400)
RBC: 4.2 MIL/uL (ref 3.87–5.11)
RDW: 13.2 % (ref 11.5–15.5)
WBC: 6.5 10*3/uL (ref 4.0–10.5)

## 2013-11-25 LAB — COMPREHENSIVE METABOLIC PANEL
ALBUMIN: 4.2 g/dL (ref 3.5–5.2)
ALK PHOS: 68 U/L (ref 39–117)
ALT: 21 U/L (ref 0–35)
AST: 32 U/L (ref 0–37)
BUN: 16 mg/dL (ref 6–23)
CHLORIDE: 102 meq/L (ref 96–112)
CO2: 24 mEq/L (ref 19–32)
Calcium: 9.3 mg/dL (ref 8.4–10.5)
Creatinine, Ser: 0.75 mg/dL (ref 0.50–1.10)
GFR calc Af Amer: >90 mL/min (ref >90)
GFR calc non Af Amer: >90 mL/min (ref >90)
GLUCOSE: 87 mg/dL (ref 70–99)
Potassium: 3.8 mEq/L (ref 3.7–5.3)
Sodium: 141 mEq/L (ref 137–147)
TOTAL PROTEIN: 7.2 g/dL (ref 6.0–8.3)
Total Bilirubin: 0.2 mg/dL — ABNORMAL LOW (ref 0.3–1.2)

## 2013-11-25 LAB — POC URINE PREG, ED: PREG TEST UR: NEGATIVE

## 2013-11-25 MED ORDER — PROMETHAZINE HCL 25 MG/ML IJ SOLN
25.0000 mg | Freq: Once | INTRAMUSCULAR | Status: AC
  Administered 2013-11-25: 25 mg via INTRAMUSCULAR
  Filled 2013-11-25: qty 1

## 2013-11-25 MED ORDER — PROMETHAZINE HCL 25 MG PO TABS: 25.0000 mg | ORAL_TABLET | Freq: Four times a day (QID) | ORAL | Status: DC | PRN

## 2013-11-25 NOTE — ED Notes (Signed)
Pt dc to home. Pt sts understanding to dc instructions. Pt ambulatory to exit without difficulty. 

## 2013-11-25 NOTE — Discharge Instructions (Signed)
Abdominal (belly) pain can be caused by many things. Your caregiver performed an examination and possibly ordered blood/urine tests and imaging (CT scan, x-rays, ultrasound). Many cases can be observed and treated at home after initial evaluation in the emergency department. Even though you are being discharged home, abdominal pain can be unpredictable. Therefore, you need a repeated exam if your pain does not resolve, returns, or worsens. Most patients with abdominal pain don't have to be admitted to the hospital or have surgery, but serious problems like appendicitis and gallbladder attacks can start out as nonspecific pain. Many abdominal conditions cannot be diagnosed in one visit, so follow-up evaluations are very important. °SEEK IMMEDIATE MEDICAL ATTENTION IF: °The pain does not go away or becomes severe.  °A temperature above 101 develops.  °Repeated vomiting occurs (multiple episodes).  °The pain becomes localized to portions of the abdomen. The right side could possibly be appendicitis. In an adult, the left lower portion of the abdomen could be colitis or diverticulitis.  °Blood is being passed in stools or vomit (bright red or black tarry stools).  °Return also if you develop chest pain, difficulty breathing, dizziness or fainting, or become confused, poorly responsive, or inconsolable (young children). ° °Chronic Pain Discharge Instructions  °Emergency care providers appreciate that many patients coming to us are in severe pain and we wish to address their pain in the safest, most responsible manner.  It is important to recognize however, that the proper treatment of chronic pain differs from that of the pain of injuries and acute illnesses.  Our goal is to provide quality, safe, personalized care and we thank you for giving us the opportunity to serve you. °The use of narcotics and related agents for chronic pain syndromes may lead to additional physical and psychological problems.  Nearly as many  people die from prescription narcotics each year as die from car crashes.  Additionally, this risk is increased if such prescriptions are obtained from a variety of sources.  Therefore, only your primary care physician or a pain management specialist is able to safely treat such syndromes with narcotic medications long-term.   ° °Documentation revealing such prescriptions have been sought from multiple sources may prohibit us from providing a refill or different narcotic medication.  Your name may be checked first through the [place] Controlled Substances Reporting System.  This database is a record of controlled substance medication prescriptions that the patient has received.  This has been established by [place] in an effort to eliminate the dangerous, and often life threatening, practice of obtaining multiple prescriptions from different medical providers.  ° °If you have a chronic pain syndrome (i.e. chronic headaches, recurrent back or neck pain, dental pain, abdominal or pelvis pain without a specific diagnosis, or neuropathic pain such as fibromyalgia) or recurrent visits for the same condition without an acute diagnosis, you may be treated with non-narcotics and other non-addictive medicines.  Allergic reactions or negative side effects that may be reported by a patient to such medications will not typically lead to the use of a narcotic analgesic or other controlled substance as an alternative. °  °Patients managing chronic pain with a personal physician should have provisions in place for breakthrough pain.  If you are in crisis, you should call your physician.  If your physician directs you to the emergency department, please have the doctor call and speak to our attending physician concerning your care. °  °When patients come to the Emergency Department (ED) with acute   medical conditions in which the Emergency Department physician feels appropriate to prescribe narcotic or sedating pain  medication, the physician will prescribe these in very limited quantities.  The amount of these medications will last only until you can see your primary care physician in his/her office.  Any patient who returns to the ED seeking refills should expect only non-narcotic pain medications.  ° °In the event of an acute medical condition exists and the emergency physician feels it is necessary that the patient be given a narcotic or sedating medication -  a responsible adult driver should be present in the room prior to the medication being given by the nurse. °  °Prescriptions for narcotic or sedating medications that have been lost, stolen or expired will not be refilled in the Emergency Department.   ° °Patients who have chronic pain may receive non-narcotic prescriptions until seen by their primary care physician.  It is every patient’s personal responsibility to maintain active prescriptions with his or her primary care physician or specialist. °

## 2013-11-25 NOTE — ED Provider Notes (Signed)
CSN: [id number]     Arrival date & time 11/24/13  2324 History   First MD Initiated Contact with Patient 11/25/13 0229     Chief Complaint  Patient presents with  . Abdominal Pain     (Consider location/radiation/quality/duration/timing/severity/associated sxs/prior Treatment) HPI 28 year old female with chronic abdominal pain multiple ED visits for the same states she usually goes [facility] ED [facility] and has also been to the emergency department 14 times here in the last 6 months usually for similar symptoms, she has chronic abdominal pain almost every day for years with chronic vomiting almost daily for years as well she states typically she will have abdominal pain for several days at a time sometimes she may be pain free for a few days but then has a recurrence of her typical pain, she states she has a typical exacerbation of her pain 24 hours a day for the last week with a few episodes of nonbloody vomiting per day for the last several days as well but normal bowel movements no blood in the vomiting no bloody stools no dysuria no vaginal bleeding no vaginal discharge no chest pain no cough no fever no rash no trauma. She ran out of her Phenergan. She is visiting family today so came to the ED here instead of [facility]. There is no treatment prior to arrival. She has had CT scans and ultrasounds in the past.  Past Medical History  Diagnosis Date  . Kidney stones   . Anemia   . Chronic abdominal pain    Past Surgical History  Procedure Laterality Date  . Cholecystectomy     No family history on file. History  Substance Use Topics  . Smoking status: Current Every Day Smoker -- 0.50 packs/day    Types: Cigarettes  . Smokeless tobacco: Not on file  . Alcohol Use: No   OB History   Grav Para Term Preterm Abortions TAB SAB Ect Mult Living                 Review of Systems 10 Systems reviewed and are negative for acute change except as noted in  the HPI.   Allergies  Compazine; Donnatal; Haldol; Latex; Lidocaine; Maalox; Mushroom extract complex; Orange fruit; Other; Penicillins; Reglan; Toradol; Tramadol; and Zofran  Home Medications   Current Outpatient Rx  Name  Route  Sig  Dispense  Refill  . promethazine (PHENERGAN) 25 MG tablet   Oral   Take 1 tablet (25 mg total) by mouth every 6 (six) hours as needed for nausea or vomiting.   10 tablet   0    BP 98/57  Pulse 83  Temp(Src) 97.7 F (36.5 C) (Oral)  Resp 17  Ht 5\' 3"  (1.6 m)  Wt 180 lb 2 oz (81.704 kg)  BMI 31.92 kg/m2  SpO2 98%  LMP 10/18/2013 Physical Exam  Nursing note and vitals reviewed. Constitutional:  Awake, alert, nontoxic appearance.  HENT:  Head: Atraumatic.  Eyes: Right eye exhibits no discharge. Left eye exhibits no discharge.  Neck: Neck supple.  Cardiovascular: Normal rate and regular rhythm.   No murmur heard. Pulmonary/Chest: Effort normal and breath sounds normal. No respiratory distress. She has no wheezes. She has no rales. She exhibits no tenderness.  Abdominal: Soft. Bowel sounds are normal. She exhibits no distension and no mass. There is tenderness. There is no rebound and no guarding.  Minimal diffuse tenderness without rebound  Musculoskeletal: She exhibits no tenderness.  Baseline ROM, no obvious new focal weakness.  Neurological: She is alert.  Mental status and motor strength appears baseline for patient and situation.  Skin: No rash noted.  Psychiatric: She has a normal mood and affect.    ED Course  Procedures (including critical care time) Patient informed of clinical course, understand medical decision-making process, and agree with plan.Pt stable in ED with no significant deterioration in condition. Labs Review Labs Reviewed  COMPREHENSIVE METABOLIC PANEL - Abnormal; Notable for the following:    Total Bilirubin 0.2 (*)    All other components within normal limits  URINALYSIS, ROUTINE W REFLEX MICROSCOPIC -  Abnormal; Notable for the following:    APPearance CLOUDY (*)    Specific Gravity, Urine 1.033 (*)    All other components within normal limits  CBC WITH DIFFERENTIAL  POC URINE PREG, ED   Imaging Review Dg Chest Port 1 View  11/26/2013   CLINICAL DATA:  Chest pain.  History of smoking.  EXAM: PORTABLE CHEST - 1 VIEW  COMPARISON:  DG ABD ACUTE W/CHEST dated 07/10/2013  FINDINGS: Single view of the chest demonstrates clear lungs. Heart and mediastinum are within normal limits. Trachea is midline. No acute bone abnormality.  IMPRESSION: No acute cardiopulmonary disease.   Electronically Signed   By: Richarda Overlie M.D.   On: 11/26/2013 22:16     EKG Interpretation None      MDM   Final diagnoses:  Chronic abdominal pain  Chronic vomiting    I doubt any other EMC precluding discharge at this time including, but not necessarily limited to the following:SBI, peritonitis.    Hurman Horn, MD 11/27/13 [phone]

## 2013-11-26 ENCOUNTER — Emergency Department (HOSPITAL_COMMUNITY): Payer: Medicare Other

## 2013-11-26 ENCOUNTER — Encounter (HOSPITAL_COMMUNITY): Payer: Self-pay | Admitting: Emergency Medicine

## 2013-11-26 ENCOUNTER — Emergency Department (HOSPITAL_COMMUNITY)
Admission: EM | Admit: 2013-11-26 | Discharge: 2013-11-26 | Disposition: A | Discharge status: Home / Self Care | Payer: Medicare Other | Attending: Emergency Medicine | Admitting: Emergency Medicine

## 2013-11-26 DIAGNOSIS — B379 Candidiasis, unspecified: Secondary | ICD-10-CM

## 2013-11-26 DIAGNOSIS — Z3202 Encounter for pregnancy test, result negative: Secondary | ICD-10-CM | POA: Insufficient documentation

## 2013-11-26 DIAGNOSIS — R079 Chest pain, unspecified: Secondary | ICD-10-CM | POA: Insufficient documentation

## 2013-11-26 DIAGNOSIS — R112 Nausea with vomiting, unspecified: Secondary | ICD-10-CM | POA: Insufficient documentation

## 2013-11-26 DIAGNOSIS — Z862 Personal history of diseases of the blood and blood-forming organs and certain disorders involving the immune mechanism: Secondary | ICD-10-CM | POA: Insufficient documentation

## 2013-11-26 DIAGNOSIS — Z880 Allergy status to penicillin: Secondary | ICD-10-CM | POA: Insufficient documentation

## 2013-11-26 DIAGNOSIS — Z91040 Latex allergy status: Secondary | ICD-10-CM | POA: Insufficient documentation

## 2013-11-26 DIAGNOSIS — R1032 Left lower quadrant pain: Secondary | ICD-10-CM

## 2013-11-26 DIAGNOSIS — G8929 Other chronic pain: Secondary | ICD-10-CM | POA: Insufficient documentation

## 2013-11-26 DIAGNOSIS — F17200 Nicotine dependence, unspecified, uncomplicated: Secondary | ICD-10-CM | POA: Insufficient documentation

## 2013-11-26 DIAGNOSIS — Z87442 Personal history of urinary calculi: Secondary | ICD-10-CM | POA: Insufficient documentation

## 2013-11-26 DIAGNOSIS — N898 Other specified noninflammatory disorders of vagina: Secondary | ICD-10-CM | POA: Insufficient documentation

## 2013-11-26 LAB — COMPREHENSIVE METABOLIC PANEL
ALBUMIN: 3.7 g/dL (ref 3.5–5.2)
ALT: 19 U/L (ref 0–35)
AST: 26 U/L (ref 0–37)
Alkaline Phosphatase: 66 U/L (ref 39–117)
BUN: 15 mg/dL (ref 6–23)
CO2: 26 mEq/L (ref 19–32)
CREATININE: 0.75 mg/dL (ref 0.50–1.10)
Calcium: 9.2 mg/dL (ref 8.4–10.5)
Chloride: 104 mEq/L (ref 96–112)
GFR calc Af Amer: >90 mL/min (ref >90)
GFR calc non Af Amer: >90 mL/min (ref >90)
Glucose, Bld: 81 mg/dL (ref 70–99)
Potassium: 4.1 mEq/L (ref 3.7–5.3)
Sodium: 140 mEq/L (ref 137–147)
TOTAL PROTEIN: 7 g/dL (ref 6.0–8.3)
Total Bilirubin: <0.2 mg/dL — ABNORMAL LOW (ref 0.3–1.2)

## 2013-11-26 LAB — URINALYSIS, ROUTINE W REFLEX MICROSCOPIC
BILIRUBIN URINE: NEGATIVE
Glucose, UA: NEGATIVE mg/dL
Hgb urine dipstick: NEGATIVE
Ketones, ur: NEGATIVE mg/dL
NITRITE: NEGATIVE
Protein, ur: NEGATIVE mg/dL
Specific Gravity, Urine: 1.026 (ref 1.005–1.030)
UROBILINOGEN UA: 0.2 mg/dL (ref 0.0–1.0)
pH: 6.5 (ref 5.0–8.0)

## 2013-11-26 LAB — CBC WITH DIFFERENTIAL/PLATELET
BASOS PCT: 0 % (ref 0–1)
Basophils Absolute: 0 10*3/uL (ref 0.0–0.1)
EOS ABS: 0.3 10*3/uL (ref 0.0–0.7)
EOS PCT: 6 % — AB (ref 0–5)
HEMATOCRIT: 36.3 % (ref 36.0–46.0)
HEMOGLOBIN: 12.5 g/dL (ref 12.0–15.0)
LYMPHS ABS: 1.7 10*3/uL (ref 0.7–4.0)
Lymphocytes Relative: 29 % (ref 12–46)
MCH: 31 pg (ref 26.0–34.0)
MCHC: 34.4 g/dL (ref 30.0–36.0)
MCV: 90.1 fL (ref 78.0–100.0)
MONO ABS: 0.5 10*3/uL (ref 0.1–1.0)
MONOS PCT: 8 % (ref 3–12)
Neutro Abs: 3.4 10*3/uL (ref 1.7–7.7)
Neutrophils Relative %: 57 % (ref 43–77)
Platelets: 211 10*3/uL (ref 150–400)
RBC: 4.03 MIL/uL (ref 3.87–5.11)
RDW: 13.1 % (ref 11.5–15.5)
WBC: 6 10*3/uL (ref 4.0–10.5)

## 2013-11-26 LAB — URINE MICROSCOPIC-ADD ON

## 2013-11-26 LAB — TROPONIN I

## 2013-11-26 LAB — LIPASE, BLOOD: LIPASE: 36 U/L (ref 11–59)

## 2013-11-26 LAB — WET PREP, GENITAL
Trich, Wet Prep: NONE SEEN
Yeast Wet Prep HPF POC: NONE SEEN

## 2013-11-26 LAB — POC URINE PREG, ED: PREG TEST UR: NEGATIVE

## 2013-11-26 MED ORDER — ALUM & MAG HYDROXIDE-SIMETH 200-200-20 MG/5ML PO SUSP
15.0000 mL | Freq: Once | ORAL | Status: DC
  Filled 2013-11-26: qty 30

## 2013-11-26 MED ORDER — KETOROLAC TROMETHAMINE 60 MG/2ML IM SOLN
60.0000 mg | Freq: Once | INTRAMUSCULAR | Status: AC
  Administered 2013-11-26: 60 mg via INTRAMUSCULAR
  Filled 2013-11-26: qty 2

## 2013-11-26 MED ORDER — DIPHENHYDRAMINE HCL 25 MG PO CAPS
50.0000 mg | ORAL_CAPSULE | Freq: Once | ORAL | Status: AC
  Administered 2013-11-26: 50 mg via ORAL
  Filled 2013-11-26: qty 2

## 2013-11-26 MED ORDER — PROMETHAZINE HCL 25 MG PO TABS
25.0000 mg | ORAL_TABLET | Freq: Once | ORAL | Status: AC
  Administered 2013-11-26: 25 mg via ORAL
  Filled 2013-11-26: qty 1

## 2013-11-26 NOTE — ED Provider Notes (Signed)
Patient seen/examined in the Emergency Department in conjunction with Resident Physician Provider  Pippin Patient reports abd pain Exam : awake/alert, watching TV, abdomen is soft to palpation Plan: d/c home, advised f/u as outpatient for chronic pain   Joya Gaskinsonald W Anabelle Bungert, MD 11/26/13 2312

## 2013-11-26 NOTE — ED Notes (Signed)
Pt refused Maalox.  Dr. Peyton NajjarPippin made aware.

## 2013-11-26 NOTE — ED Notes (Signed)
PT c/o reproducible chest pain since last Monday as well as LLQ pain and intermittent vomiting since Saturday.  Pt also c/o pain with urination.  Denies cough.

## 2013-11-26 NOTE — ED Notes (Signed)
Pt to ED c/o left lower abd pain with nausea and vomiting.  Seen here on 3/17 for same.  Pt st's has been trying to take OTC meds but vomits them up.

## 2013-11-26 NOTE — Discharge Instructions (Signed)
Abdominal Pain, Adult °Many things can cause abdominal pain. Usually, abdominal pain is not caused by a disease and will improve without treatment. It can often be observed and treated at home. Your health care provider will do a physical exam and possibly order blood tests and X-rays to help determine the seriousness of your pain. However, in many cases, more time must pass before a clear cause of the pain can be found. Before that point, your health care provider may not know if you need more testing or further treatment. °HOME CARE INSTRUCTIONS  °Monitor your abdominal pain for any changes. The following actions may help to alleviate any discomfort you are experiencing: °· Only take over-the-counter or prescription medicines as directed by your health care provider. °· Do not take laxatives unless directed to do so by your health care provider. °· Try a clear liquid diet (broth, tea, or water) as directed by your health care provider. Slowly move to a bland diet as tolerated. °SEEK MEDICAL CARE IF: °· You have unexplained abdominal pain. °· You have abdominal pain associated with nausea or diarrhea. °· You have pain when you urinate or have a bowel movement. °· You experience abdominal pain that wakes you in the night. °· You have abdominal pain that is worsened or improved by eating food. °· You have abdominal pain that is worsened with eating fatty foods. °SEEK IMMEDIATE MEDICAL CARE IF:  °· Your pain does not go away within 2 hours. °· You have a fever. °· You keep throwing up (vomiting). °· Your pain is felt only in portions of the abdomen, such as the right side or the left lower portion of the abdomen. °· You pass bloody or black tarry stools. °MAKE SURE YOU: °· Understand these instructions.   °· Will watch your condition.   °· Will get help right away if you are not doing well or get worse.   °Document Released: 06/06/2005 Document Revised: 06/17/2013 Document Reviewed: 05/06/2013 °ExitCare® Patient  Information ©2014 [employer]. ° °

## 2013-11-26 NOTE — ED Notes (Addendum)
Mini lab called; Dark green top for i stat troponin sample not in lab or mini lab. There is enough of sample still in lab for add on serum troponin.

## 2013-11-26 NOTE — ED Provider Notes (Signed)
CSN: [id number]     Arrival date & time 11/26/13  1649 History   First MD Initiated Contact with Patient 11/26/13 2108     Chief Complaint  Patient presents with  . Abdominal Pain  . Chest Pain    HPI Paula Little is a 28 y.o. female with chronic abdominal pain who presents complaining of abdominal pain.  Onset was years ago.  She feels that her pain is worse for the past several days.  It is located to her left abdomen, though poorly localized.  It is constant.  It is cramping. It is aggravated by nothing and relieved by nothing.  It is similar to pain she has had for months to years.  She states noone has ever diagnosed her pain.  She has been seen here 14 times this year for similar complaints, as well as numerous visits to [facility] in [place].  No treatments tried.  She states she is not taking tylenol or other OTC analgesics.     PT c/o reproducible chest pain since last Monday as well as LLQ pain and intermittent vomiting since Saturday. Pt also c/o pain with urination. Denies cough.  28 year old female with chronic abdominal pain multiple ED visits for the same states she usually goes to the ED at [facility] and has also been to the emergency department 14 times here in the last 6 months usually for similar symptoms, she has chronic abdominal pain almost every day for years with chronic vomiting almost daily for years as well she states typically she will have abdominal pain for several days at a time sometimes she may be pain free for a few days but then has a recurrence of her typical pain, she states she has a typical exacerbation of her pain 24 hours a day for the last week with a few episodes of nonbloody vomiting per day for the last several days as well but normal bowel movements no blood in the vomiting no bloody stools no dysuria no vaginal bleeding no vaginal discharge no chest pain no cough no fever no rash no trauma. She ran out of her Phenergan. She is  visiting family today so came to the ED here instead of going to [facility] where she usually goes. There is no treatment prior to arrival. She has had CT scans and ultrasounds in the past.    Past Medical History  Diagnosis Date  . Kidney stones   . Anemia   . Chronic abdominal pain    Past Surgical History  Procedure Laterality Date  . Cholecystectomy     No family history on file. History  Substance Use Topics  . Smoking status: Current Every Day Smoker -- 0.50 packs/day    Types: Cigarettes  . Smokeless tobacco: Not on file  . Alcohol Use: No   OB History   Grav Para Term Preterm Abortions TAB SAB Ect Mult Living                 Review of Systems  Constitutional: Negative for fever, chills and diaphoresis.  HENT: Negative for congestion and rhinorrhea.   Respiratory: Negative for cough, shortness of breath and wheezing.   Cardiovascular: Negative for chest pain and leg swelling.  Gastrointestinal: Positive for nausea, vomiting and abdominal pain. Negative for diarrhea.  Genitourinary: Negative for dysuria, urgency, frequency, flank pain, vaginal bleeding, vaginal discharge and difficulty urinating.  Musculoskeletal: Negative for neck pain and neck stiffness.  Skin: Negative for rash.  Neurological: Negative  for weakness, numbness and headaches.  All other systems reviewed and are negative.      Allergies  Compazine; Donnatal; Haldol; Latex; Lidocaine; Maalox; Mushroom extract complex; Orange fruit; Other; Penicillins; Reglan; Toradol; Tramadol; and Zofran  Home Medications   Current Outpatient Rx  Name  Route  Sig  Dispense  Refill  . promethazine (PHENERGAN) 25 MG tablet   Oral   Take 1 tablet (25 mg total) by mouth every 6 (six) hours as needed for nausea or vomiting.   10 tablet   0    BP 118/70  Pulse 86  Temp(Src) 97.9 F (36.6 C) (Oral)  Resp 18  Ht 5\' 3"  (1.6 m)  Wt 179 lb 12.8 oz (81.557 kg)  BMI 31.86 kg/m2  SpO2 100%  LMP  10/16/2013 Physical Exam  Nursing note and vitals reviewed. Constitutional: She is oriented to person, place, and time. She appears well-developed and well-nourished. No distress.  HENT:  Head: Normocephalic and atraumatic.  Mouth/Throat: No oropharyngeal exudate.  Eyes: Conjunctivae and EOM are normal. Pupils are equal, round, and reactive to light. No scleral icterus.  Neck: Normal range of motion. Neck supple.  Cardiovascular: Normal rate, regular rhythm, normal heart sounds and intact distal pulses.  Exam reveals no gallop and no friction rub.   No murmur heard. Pulmonary/Chest: Effort normal and breath sounds normal. No respiratory distress. She has no wheezes. She has no rales.  Abdominal: Soft. Normal appearance. She exhibits no distension and no ascites. There is no hepatosplenomegaly. There is no tenderness. There is no rigidity, no rebound, no guarding, no CVA tenderness, no tenderness at McBurney's point and negative Murphy's sign. No hernia.  Genitourinary: There is no rash, tenderness or lesion on the right labia. There is no rash, tenderness or lesion on the left labia. Cervix exhibits no motion tenderness and no friability. Right adnexum displays no mass, no tenderness and no fullness. Left adnexum displays no mass, no tenderness and no fullness. No erythema or tenderness around the vagina. No foreign body around the vagina. Vaginal discharge (thick, white) found.  Musculoskeletal: She exhibits no tenderness.  Neurological: She is alert and oriented to person, place, and time. No cranial nerve deficit. She exhibits normal muscle tone. Coordination normal.  Skin: Skin is warm and dry. No rash noted.    ED Course  Procedures (including critical care time) Labs Review Labs Reviewed  WET PREP, GENITAL - Abnormal; Notable for the following:    Clue Cells Wet Prep HPF POC MODERATE (*)    WBC, Wet Prep HPF POC RARE (*)    All other components within normal limits  CBC WITH  DIFFERENTIAL - Abnormal; Notable for the following:    Eosinophils Relative 6 (*)    All other components within normal limits  COMPREHENSIVE METABOLIC PANEL - Abnormal; Notable for the following:    Total Bilirubin <0.2 (*)    All other components within normal limits  URINALYSIS, ROUTINE W REFLEX MICROSCOPIC - Abnormal; Notable for the following:    APPearance TURBID (*)    Leukocytes, UA SMALL (*)    All other components within normal limits  URINE MICROSCOPIC-ADD ON - Abnormal; Notable for the following:    Squamous Epithelial / LPF MANY (*)    Bacteria, UA FEW (*)    All other components within normal limits  GC/CHLAMYDIA PROBE AMP  LIPASE, BLOOD  TROPONIN I  POC URINE PREG, ED   Imaging Review No results found.   EKG Interpretation   Date/Time:  Thursday November 26 2013 17:05:49 EDT Ventricular Rate:  85 PR Interval:  174 QRS Duration: 70 QT Interval:  338 QTC Calculation: 402 R Axis:   80 Text Interpretation:  Normal sinus rhythm Normal ECG Confirmed by Bebe Shaggy   MD, Dorinda Hill ([id number]) on 11/26/2013 9:45:05 PM      MDM   28 yo F with chronic abd pain here c/o L abdominal pain, ongoing for years.  Has been seen here for the same 14+ times this year, and has been seen numerous times at other hospitals.  She has no fever.  She endorses vomiting, though she was in the ED for 2.5 hours and no vomiting was noted by myself or nursing staff; she was eating and drinking.  No other associated symptoms.  On exam, she is non-toxic.  No acute distress.  Appears comfortable.  abd soft and non-tender.  Pelvic exam reveals thick white discharge, but no CMT, adnexal mass or tenderness.  She reports she has had several yeast infections.  Denies history of STI.  Labs reviewed, unremarkable.  A troponin was sent as part of triage protocol by nursing, and is negative.  ACS is not in differential.  She denies any chest pain to me. Pregnancy test negative.  Sent STI panel.  Will not wait for wet  prep; will treat presumptively for yeast infection with single dose diflucan.  Do not feel imaging is warranted.  I do not suspect diverticulitis, kidney stone, obstruction, or other emergent cause.  Discharged home.  Advised her she needs to secure PCP followup for chronic pain issues.  Final diagnoses:  LLQ abdominal pain  Yeast infection       Toney Sang, MD 11/29/13 1237  Toney Sang, MD 11/29/13 1238

## 2013-11-28 LAB — GC/CHLAMYDIA PROBE AMP
CT Probe RNA: NEGATIVE
GC Probe RNA: NEGATIVE

## 2013-11-29 NOTE — ED Provider Notes (Signed)
I have personally seen and examined the patient.  I have discussed the plan of care with the resident.  I have reviewed the documentation on PMH/FH/Soc. History.  I have reviewed the documentation of the resident and agree.  Pt well appearing, nontoxic in appearance, advised f/u as outpatient for her chronic pain needs   Joya Gaskinsonald W Katarina Riebe, MD 11/29/13 2343

## 2014-02-09 IMAGING — CR DG CHEST 2V
2 series · 2 of 2 positions shown · non-contrast
Comparison: 06/08/2013

CLINICAL DATA: Abdominal pain and chest pain.

EXAM:
CHEST  2 VIEW

[w chest pa]
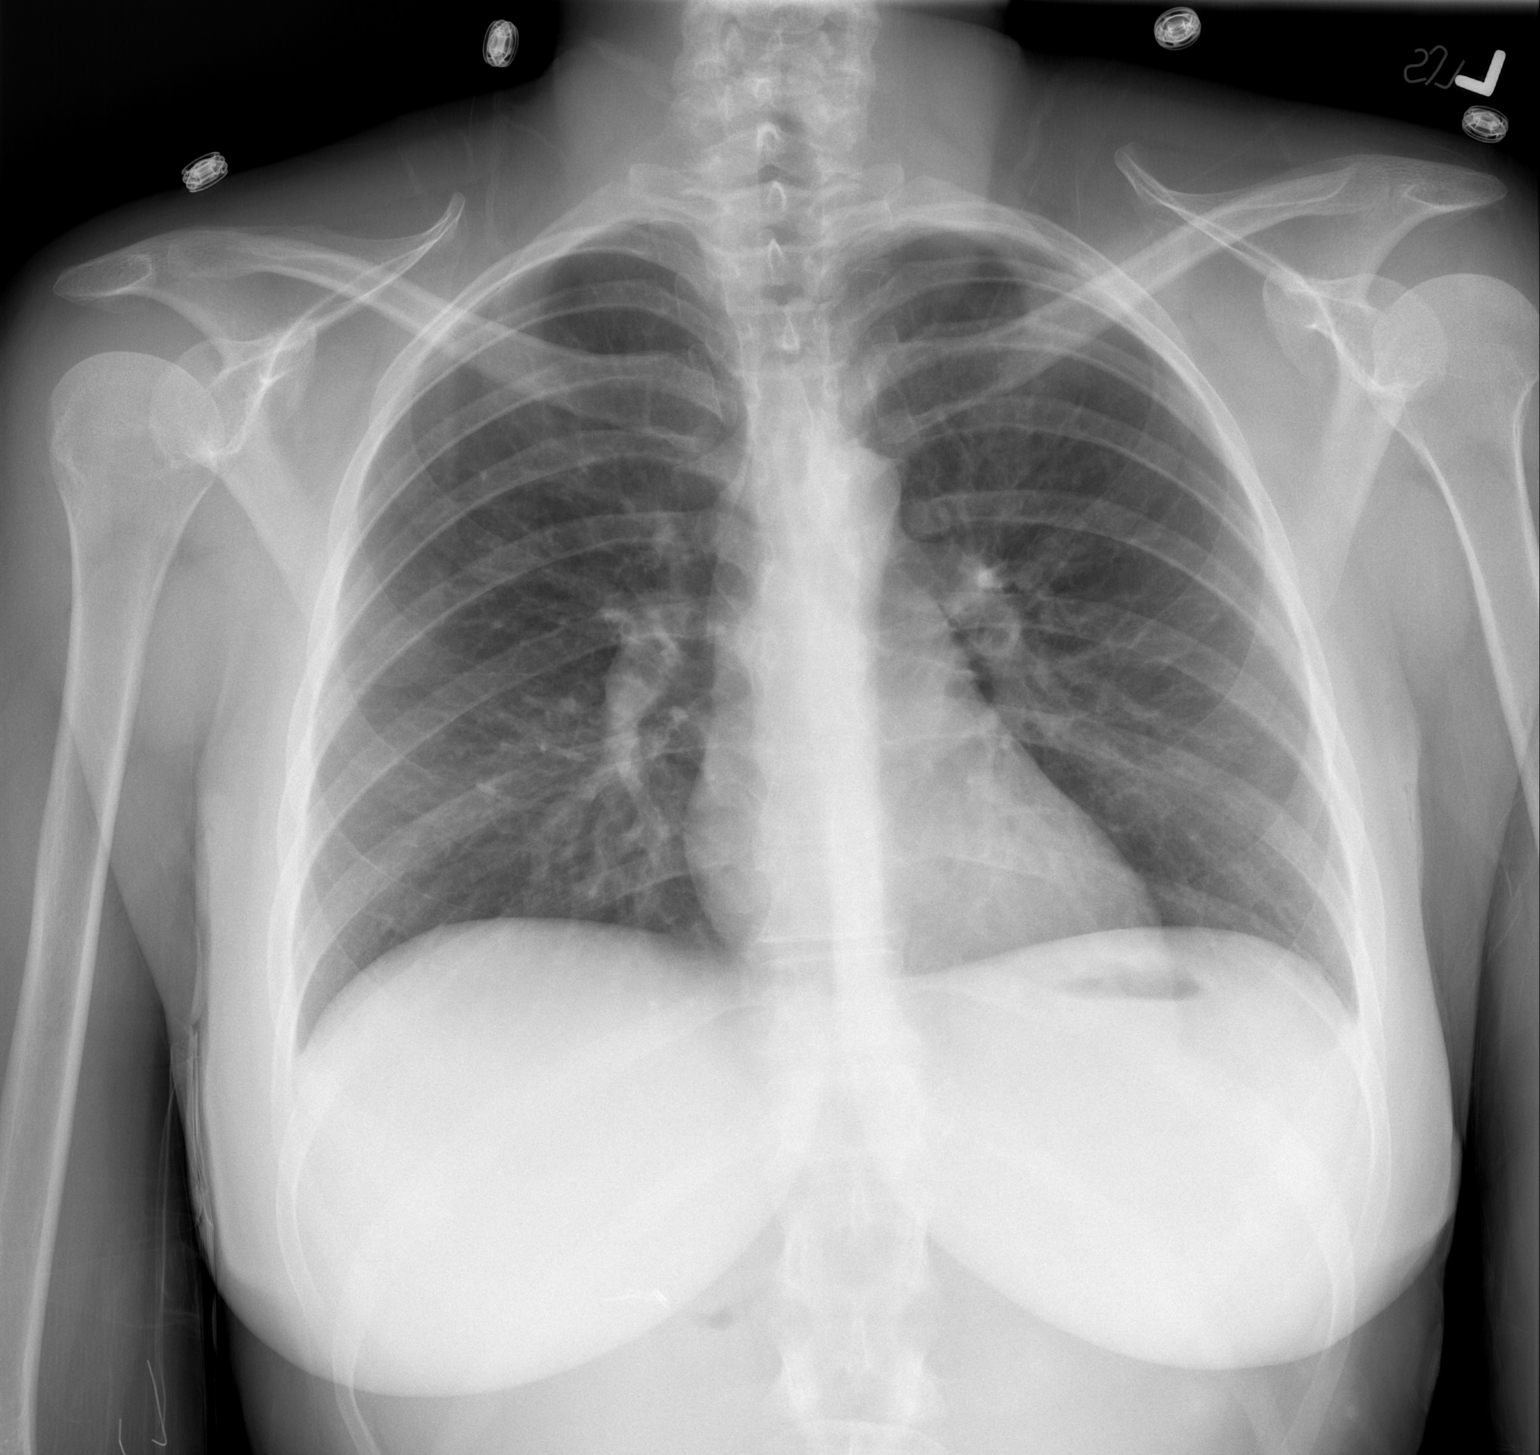

[w chest lat]
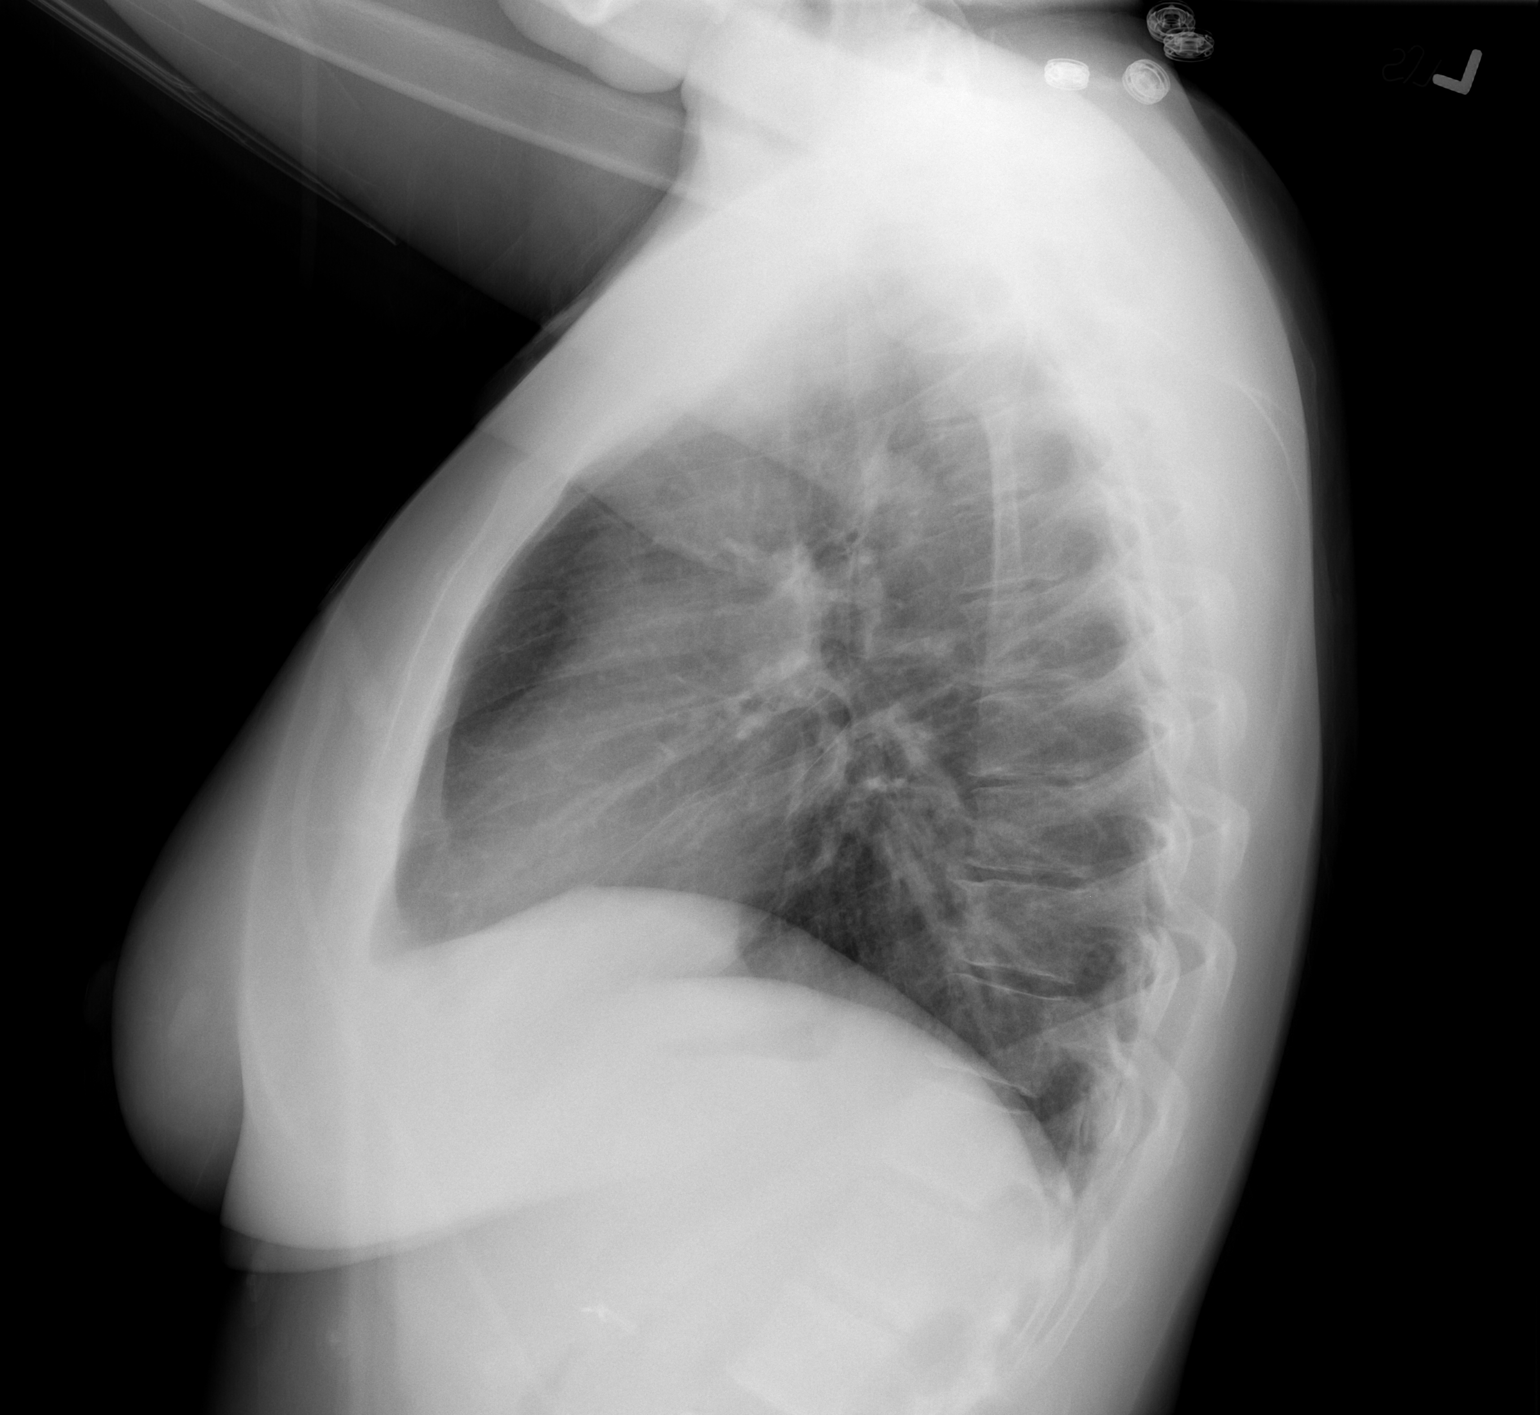

[2 of 2 positions shown; findings below may reference images not displayed]

FINDINGS: The heart size and mediastinal contours are within normal limits.
Both lungs are clear. The visualized skeletal structures are
unremarkable.
IMPRESSION: No active cardiopulmonary disease.

## 2014-11-01 ENCOUNTER — Encounter (HOSPITAL_COMMUNITY): Payer: Self-pay | Admitting: *Deleted

## 2014-11-01 ENCOUNTER — Emergency Department (HOSPITAL_COMMUNITY)
Admission: EM | Admit: 2014-11-01 | Discharge: 2014-11-01 | Disposition: A | Discharge status: Home / Self Care | Payer: Medicare Other | Attending: Emergency Medicine | Admitting: Emergency Medicine

## 2014-11-01 DIAGNOSIS — Z880 Allergy status to penicillin: Secondary | ICD-10-CM | POA: Insufficient documentation

## 2014-11-01 DIAGNOSIS — R109 Unspecified abdominal pain: Secondary | ICD-10-CM

## 2014-11-01 DIAGNOSIS — Z9049 Acquired absence of other specified parts of digestive tract: Secondary | ICD-10-CM | POA: Insufficient documentation

## 2014-11-01 DIAGNOSIS — Z862 Personal history of diseases of the blood and blood-forming organs and certain disorders involving the immune mechanism: Secondary | ICD-10-CM | POA: Diagnosis not present

## 2014-11-01 DIAGNOSIS — Z3202 Encounter for pregnancy test, result negative: Secondary | ICD-10-CM | POA: Diagnosis not present

## 2014-11-01 DIAGNOSIS — N760 Acute vaginitis: Secondary | ICD-10-CM | POA: Diagnosis not present

## 2014-11-01 DIAGNOSIS — Z91040 Latex allergy status: Secondary | ICD-10-CM | POA: Diagnosis not present

## 2014-11-01 DIAGNOSIS — G8929 Other chronic pain: Secondary | ICD-10-CM | POA: Diagnosis not present

## 2014-11-01 DIAGNOSIS — B9689 Other specified bacterial agents as the cause of diseases classified elsewhere: Secondary | ICD-10-CM

## 2014-11-01 DIAGNOSIS — R1084 Generalized abdominal pain: Secondary | ICD-10-CM | POA: Diagnosis present

## 2014-11-01 DIAGNOSIS — Z87442 Personal history of urinary calculi: Secondary | ICD-10-CM | POA: Diagnosis not present

## 2014-11-01 LAB — URINE MICROSCOPIC-ADD ON

## 2014-11-01 LAB — COMPREHENSIVE METABOLIC PANEL
ALBUMIN: 4.2 g/dL (ref 3.5–5.2)
ALK PHOS: 80 U/L (ref 39–117)
ALT: 87 U/L — ABNORMAL HIGH (ref 0–35)
ANION GAP: 9 (ref 5–15)
AST: 57 U/L — AB (ref 0–37)
BILIRUBIN TOTAL: 0.7 mg/dL (ref 0.3–1.2)
BUN: 14 mg/dL (ref 6–23)
CHLORIDE: 105 mmol/L (ref 96–112)
CO2: 25 mmol/L (ref 19–32)
Calcium: 9.3 mg/dL (ref 8.4–10.5)
Creatinine, Ser: 0.81 mg/dL (ref 0.50–1.10)
GFR calc non Af Amer: >90 mL/min (ref >90)
GLUCOSE: 88 mg/dL (ref 70–99)
POTASSIUM: 3.9 mmol/L (ref 3.5–5.1)
Sodium: 139 mmol/L (ref 135–145)
Total Protein: 7.3 g/dL (ref 6.0–8.3)

## 2014-11-01 LAB — CBC WITH DIFFERENTIAL/PLATELET
BASOS ABS: 0 10*3/uL (ref 0.0–0.1)
Basophils Relative: 0 % (ref 0–1)
EOS PCT: 4 % (ref 0–5)
Eosinophils Absolute: 0.3 10*3/uL (ref 0.0–0.7)
HCT: 38.4 % (ref 36.0–46.0)
Hemoglobin: 12.9 g/dL (ref 12.0–15.0)
LYMPHS ABS: 2 10*3/uL (ref 0.7–4.0)
Lymphocytes Relative: 28 % (ref 12–46)
MCH: 31 pg (ref 26.0–34.0)
MCHC: 33.6 g/dL (ref 30.0–36.0)
MCV: 92.3 fL (ref 78.0–100.0)
MONO ABS: 0.5 10*3/uL (ref 0.1–1.0)
Monocytes Relative: 7 % (ref 3–12)
NEUTROS PCT: 61 % (ref 43–77)
Neutro Abs: 4.3 10*3/uL (ref 1.7–7.7)
Platelets: 261 10*3/uL (ref 150–400)
RBC: 4.16 MIL/uL (ref 3.87–5.11)
RDW: 13.6 % (ref 11.5–15.5)
WBC: 7.1 10*3/uL (ref 4.0–10.5)

## 2014-11-01 LAB — URINALYSIS, ROUTINE W REFLEX MICROSCOPIC
BILIRUBIN URINE: NEGATIVE
GLUCOSE, UA: NEGATIVE mg/dL
Ketones, ur: 40 mg/dL — AB
Leukocytes, UA: NEGATIVE
Nitrite: NEGATIVE
PROTEIN: NEGATIVE mg/dL
Specific Gravity, Urine: 1.03 (ref 1.005–1.030)
Urobilinogen, UA: 0.2 mg/dL (ref 0.0–1.0)
pH: 6 (ref 5.0–8.0)

## 2014-11-01 LAB — WET PREP, GENITAL
Trich, Wet Prep: NONE SEEN
Yeast Wet Prep HPF POC: NONE SEEN

## 2014-11-01 LAB — PREGNANCY, URINE: Preg Test, Ur: NEGATIVE

## 2014-11-01 LAB — I-STAT TROPONIN, ED: TROPONIN I, POC: 0 ng/mL (ref 0.00–0.08)

## 2014-11-01 LAB — LIPASE, BLOOD: Lipase: 25 U/L (ref 11–59)

## 2014-11-01 MED ORDER — DICLOFENAC SODIUM 50 MG PO TBEC: 50.0000 mg | DELAYED_RELEASE_TABLET | Freq: Two times a day (BID) | ORAL | Status: DC

## 2014-11-01 MED ORDER — PROMETHAZINE HCL 25 MG/ML IJ SOLN
25.0000 mg | Freq: Once | INTRAMUSCULAR | Status: AC
  Administered 2014-11-01: 25 mg via INTRAVENOUS
  Filled 2014-11-01: qty 1

## 2014-11-01 MED ORDER — DIPHENHYDRAMINE HCL 50 MG/ML IJ SOLN
25.0000 mg | Freq: Once | INTRAMUSCULAR | Status: AC
  Administered 2014-11-01: 25 mg via INTRAVENOUS
  Filled 2014-11-01: qty 1

## 2014-11-01 MED ORDER — KETOROLAC TROMETHAMINE 30 MG/ML IJ SOLN
30.0000 mg | Freq: Once | INTRAMUSCULAR | Status: AC
Start: 2014-11-01 — End: 2014-11-01
  Administered 2014-11-01: 30 mg via INTRAVENOUS
  Filled 2014-11-01: qty 1

## 2014-11-01 MED ORDER — PROMETHAZINE HCL 25 MG PO TABS: 25.0000 mg | ORAL_TABLET | Freq: Four times a day (QID) | ORAL | Status: DC | PRN

## 2014-11-01 MED ORDER — HYDROMORPHONE HCL 1 MG/ML IJ SOLN
1.0000 mg | Freq: Once | INTRAMUSCULAR | Status: AC
  Administered 2014-11-01: 1 mg via INTRAVENOUS
  Filled 2014-11-01: qty 1

## 2014-11-01 MED ORDER — METRONIDAZOLE 500 MG PO TABS
500.0000 mg | ORAL_TABLET | Freq: Two times a day (BID) | ORAL | Status: DC
Start: 2014-11-01 — End: 2015-06-07

## 2014-11-01 MED ORDER — SODIUM CHLORIDE 0.9 % IV SOLN
Freq: Once | INTRAVENOUS | Status: AC
  Administered 2014-11-01: 20:00:00 via INTRAVENOUS

## 2014-11-01 NOTE — Discharge Instructions (Signed)
Please follow up with your primary care physician in 1-2 days. If you do not have one please call the [facility] number listed above. Please follow up with [facility] to schedule a follow up appointment.  Please take your antibiotic until completion. Please read all discharge instructions and return precautions.   Bacterial Vaginosis Bacterial vaginosis is a vaginal infection that occurs when the normal balance of bacteria in the vagina is disrupted. It results from an overgrowth of certain bacteria. This is the most common vaginal infection in women of childbearing age. Treatment is important to prevent complications, especially in pregnant women, as it can cause a premature delivery. CAUSES  Bacterial vaginosis is caused by an increase in harmful bacteria that are normally present in smaller amounts in the vagina. Several different kinds of bacteria can cause bacterial vaginosis. However, the reason that the condition develops is not fully understood. RISK FACTORS Certain activities or behaviors can put you at an increased risk of developing bacterial vaginosis, including:  Having a new sex partner or multiple sex partners.  Douching.  Using an intrauterine device (IUD) for contraception. Women do not get bacterial vaginosis from toilet seats, bedding, swimming pools, or contact with objects around them. SIGNS AND SYMPTOMS  Some women with bacterial vaginosis have no signs or symptoms. Common symptoms include:  Grey vaginal discharge.  A fishlike odor with discharge, especially after sexual intercourse.  Itching or burning of the vagina and vulva.  Burning or pain with urination. DIAGNOSIS  Your health care provider will take a medical history and examine the vagina for signs of bacterial vaginosis. A sample of vaginal fluid may be taken. Your health care provider will look at this sample under a microscope to check for bacteria and abnormal cells. A vaginal pH  test may also be done.  TREATMENT  Bacterial vaginosis may be treated with antibiotic medicines. These may be given in the form of a pill or a vaginal cream. A second round of antibiotics may be prescribed if the condition comes back after treatment.  HOME CARE INSTRUCTIONS   Only take over-the-counter or prescription medicines as directed by your health care provider.  If antibiotic medicine was prescribed, take it as directed. Make sure you finish it even if you start to feel better.  Do not have sex until treatment is completed.  Tell all sexual partners that you have a vaginal infection. They should see their health care provider and be treated if they have problems, such as a mild rash or itching.  Practice safe sex by using condoms and only having one sex partner. SEEK MEDICAL CARE IF:   Your symptoms are not improving after 3 days of treatment.  You have increased discharge or pain.  You have a fever. MAKE SURE YOU:   Understand these instructions.  Will watch your condition.  Will get help right away if you are not doing well or get worse. FOR MORE INFORMATION  Centers for Disease Control and Prevention, Division of STD Prevention: SolutionApps.co.zawww.cdc.gov/std American Sexual Health Association (ASHA): www.ashastd.org  Document Released: 08/27/2005 Document Revised: 06/17/2013 Document Reviewed: 04/08/2013 [facility] Patient Information 2015 [employer] [place]. This information is not intended to replace advice given to you by your health care provider. Make sure you discuss any questions you have with your health care provider.

## 2014-11-01 NOTE — ED Provider Notes (Signed)
CSN: [id number]     Arrival date & time 11/01/14  1522 History   First MD Initiated Contact with Patient 11/01/14 1954     Chief Complaint  Patient presents with  . Abdominal Pain  . Nausea  . Emesis     (Consider location/radiation/quality/duration/timing/severity/associated sxs/prior Treatment) HPI Comments: Patient is a [id number] 29 year old female presenting to the emergency department for evaluation of generalized cramping abdominal pain has worsened over the last 4 days. She states she has had nausea and vomiting over the last 4 days as well states approximately 4 episodes daily, nonbloody nonbilious. She also endorses associated urinary frequency, urgency, dysuria. States is similar to previous episodes of abdominal pain. Denies any vaginal bleeding, discharge, diarrhea, constipation. She has tried Phenergan, Bentyl, Tylenol with no improvement. No modifying factors identified. Last menstrual period was February 6. No abdominal surgical history.  Patient is a 29 y.o. female presenting with abdominal pain and vomiting.  Abdominal Pain Associated symptoms: dysuria and vomiting   Emesis Associated symptoms: abdominal pain     Past Medical History  Diagnosis Date  . Kidney stones   . Anemia   . Chronic abdominal pain    Past Surgical History  Procedure Laterality Date  . Cholecystectomy     History reviewed. No pertinent family history. History  Substance Use Topics  . Smoking status: Current Every Day Smoker -- 0.50 packs/day    Types: Cigarettes  . Smokeless tobacco: Not on file  . Alcohol Use: No   OB History    No data available     Review of Systems  Gastrointestinal: Positive for vomiting and abdominal pain.  Genitourinary: Positive for dysuria, frequency and decreased urine volume.  All other systems reviewed and are negative.     Allergies  Compazine; Donnatal; Haldol; Latex; Lidocaine; Maalox; Mushroom extract complex; Orange fruit; Other; Penicillins;  Reglan; Toradol; Tramadol; and Zofran  Home Medications   Prior to Admission medications   Medication Sig Start Date End Date Taking? Authorizing Provider  diclofenac (VOLTAREN) 50 MG EC tablet Take 1 tablet (50 mg total) by mouth 2 (two) times daily. 11/01/14   Allisa Einspahr L Crystallee Werden, PA-C  metroNIDAZOLE (FLAGYL) 500 MG tablet Take 1 tablet (500 mg total) by mouth 2 (two) times daily. 11/01/14   Tamaiya Bump L Sarim Rothman, PA-C  promethazine (PHENERGAN) 25 MG tablet Take 1 tablet (25 mg total) by mouth every 6 (six) hours as needed for nausea or vomiting. Patient not taking: Reported on 11/01/2014 11/25/13   Hurman Horn, MD  promethazine (PHENERGAN) 25 MG tablet Take 1 tablet (25 mg total) by mouth every 6 (six) hours as needed for nausea or vomiting. 11/01/14   Lise Auer Lazariah Savard, PA-C   BP 115/71 mmHg  Pulse 89  Temp(Src) 98.4 F (36.9 C)  Resp 28  SpO2 99%  LMP 10/16/2014 Physical Exam  Constitutional: She is oriented to person, place, and time. She appears well-developed and well-nourished. No distress.  HENT:  Head: Normocephalic and atraumatic.  Right Ear: External ear normal.  Left Ear: External ear normal.  Nose: Nose normal.  Mouth/Throat: No oropharyngeal exudate.  Eyes: Conjunctivae are normal.  Neck: Neck supple.  Cardiovascular: Normal rate, regular rhythm and normal heart sounds.   Pulmonary/Chest: Effort normal and breath sounds normal. No respiratory distress.  Abdominal: Soft. Bowel sounds are normal. She exhibits no distension. There is tenderness (mild) in the right lower quadrant, suprapubic area and left lower quadrant. There is no rigidity, no rebound and no guarding.  Musculoskeletal: Normal range of motion. She exhibits no edema.  Neurological: She is alert and oriented to person, place, and time.  Skin: Skin is warm and dry. She is not diaphoretic.  Nursing note and vitals reviewed.  Exam performed by Francee Piccolo L,  exam chaperoned Date:  11/01/2014 Pelvic exam: normal external genitalia without evidence of trauma. VULVA: normal appearing vulva with no masses, tenderness or lesion. VAGINA: normal appearing vagina with normal color and discharge, no lesions. CERVIX: normal appearing cervix without lesions, cervical motion tenderness absent, cervical os closed with out purulent discharge; vaginal discharge - white, Wet prep and DNA probe for chlamydia and GC obtained.   ADNEXA: normal adnexa in size, nontender and no masses UTERUS: uterus is normal size, shape, consistency and nontender.      ED Course  Procedures (including critical care time) Medications  promethazine (PHENERGAN) injection 25 mg (25 mg Intravenous Given 11/01/14 2057)  0.9 %  sodium chloride infusion ( Intravenous Stopped 11/01/14 2159)  HYDROmorphone (DILAUDID) injection 1 mg (1 mg Intravenous Given 11/01/14 2057)  diphenhydrAMINE (BENADRYL) injection 25 mg (25 mg Intravenous Given 11/01/14 2134)  ketorolac (TORADOL) 30 MG/ML injection 30 mg (30 mg Intravenous Given 11/01/14 2135)    Labs Review Labs Reviewed  WET PREP, GENITAL - Abnormal; Notable for the following:    Clue Cells Wet Prep HPF POC TOO NUMEROUS TO COUNT (*)    WBC, Wet Prep HPF POC FEW (*)    All other components within normal limits  COMPREHENSIVE METABOLIC PANEL - Abnormal; Notable for the following:    AST 57 (*)    ALT 87 (*)    All other components within normal limits  URINALYSIS, ROUTINE W REFLEX MICROSCOPIC - Abnormal; Notable for the following:    Hgb urine dipstick SMALL (*)    Ketones, ur 40 (*)    All other components within normal limits  URINE MICROSCOPIC-ADD ON - Abnormal; Notable for the following:    Squamous Epithelial / LPF FEW (*)    All other components within normal limits  CBC WITH DIFFERENTIAL/PLATELET  LIPASE, BLOOD  PREGNANCY, URINE  I-STAT TROPOININ, ED  GC/CHLAMYDIA PROBE AMP ([place])    Imaging Review No results found.   EKG  Interpretation   Date/Time:  Monday November 01 2014 15:27:15 EST Ventricular Rate:  84 PR Interval:  170 QRS Duration: 72 QT Interval:  356 QTC Calculation: 420 R Axis:   87 Text Interpretation:  Normal sinus rhythm with sinus arrhythmia Normal ECG  When compared with ECG of 11/26/2013, No significant change was found  Confirmed by [facility]  MD, DAVID ([id number]) on 11/01/2014 3:31:28 PM      MDM   Final diagnoses:  Chronic abdominal pain  Bacterial vaginosis    Filed Vitals:   11/01/14 2145  BP: 115/71  Pulse: 89  Temp:   Resp: 28   Afebrile, NAD, non-toxic appearing, AAOx4.  I have reviewed nursing notes, vital signs, and all appropriate lab and imaging results for this patient.  1) BV: Patient to be discharged with instructions to follow up with OBGYN. Pt understands GC/Chlamydia cultures pending and that they will need to inform all sexual partners within the last 6 months if results return positive. Pt advised that she will receive a call in 48 hours if the test is positive and to refrain from sexual activity for 48 hours. If the test is positive, pt is to return to the ED or PCP or [facility] for treatment and then  advised to refrain from sexual activity for 10 days for the medicine to take effect.  Pt not concerning for PID because hemodynamically stable and no cervical motion tenderness on pelvic exam. Pt has also been treated with flagyl for Bacterial Vaginosis. Pt has been advised to not drink alcohol while on this medication.   2) Chronic Abdominal Pain: Labs reviewed, unremarkable. A troponin was sent as part of triage protocol by nursing, and is negative. ACS is not in differential. She denies any chest pain to me. Pregnancy test negative. Sent STD panel. Do not feel imaging is warranted. I do not suspect diverticulitis, kidney stone, obstruction, or other emergent cause. Discharged home. Advised her she needs to secure PCP followup for chronic pain issues.  Patient is  agreeable to plan. Patient is stable at time of discharge        Jeannetta EllisJennifer L Providencia Hottenstein, PA-C 11/02/14 0044  Dione Boozeavid Glick, MD 11/03/14 [phone]

## 2014-11-01 NOTE — ED Notes (Signed)
Pt c/o abdominal pain for a week, n/v for four days. Denies diarrhea, denies being around anybody sick.

## 2014-11-02 LAB — GC/CHLAMYDIA PROBE AMP (~~LOC~~) NOT AT ARMC
CHLAMYDIA, DNA PROBE: NEGATIVE
Neisseria Gonorrhea: NEGATIVE

## 2014-12-21 ENCOUNTER — Emergency Department (HOSPITAL_COMMUNITY)
Admission: EM | Admit: 2014-12-21 | Discharge: 2014-12-21 | Disposition: A | Discharge status: Home / Self Care | Payer: Medicare Other | Attending: Emergency Medicine | Admitting: Emergency Medicine

## 2014-12-21 ENCOUNTER — Encounter (HOSPITAL_COMMUNITY): Payer: Self-pay

## 2014-12-21 ENCOUNTER — Emergency Department (HOSPITAL_COMMUNITY): Payer: Medicare Other

## 2014-12-21 DIAGNOSIS — Z791 Long term (current) use of non-steroidal anti-inflammatories (NSAID): Secondary | ICD-10-CM | POA: Diagnosis not present

## 2014-12-21 DIAGNOSIS — Z87442 Personal history of urinary calculi: Secondary | ICD-10-CM | POA: Insufficient documentation

## 2014-12-21 DIAGNOSIS — Z792 Long term (current) use of antibiotics: Secondary | ICD-10-CM | POA: Diagnosis not present

## 2014-12-21 DIAGNOSIS — R112 Nausea with vomiting, unspecified: Secondary | ICD-10-CM | POA: Insufficient documentation

## 2014-12-21 DIAGNOSIS — Z79899 Other long term (current) drug therapy: Secondary | ICD-10-CM | POA: Diagnosis not present

## 2014-12-21 DIAGNOSIS — Z91040 Latex allergy status: Secondary | ICD-10-CM | POA: Insufficient documentation

## 2014-12-21 DIAGNOSIS — Z3202 Encounter for pregnancy test, result negative: Secondary | ICD-10-CM | POA: Insufficient documentation

## 2014-12-21 DIAGNOSIS — G8929 Other chronic pain: Secondary | ICD-10-CM | POA: Insufficient documentation

## 2014-12-21 DIAGNOSIS — Z862 Personal history of diseases of the blood and blood-forming organs and certain disorders involving the immune mechanism: Secondary | ICD-10-CM | POA: Insufficient documentation

## 2014-12-21 DIAGNOSIS — Z720 Tobacco use: Secondary | ICD-10-CM | POA: Diagnosis not present

## 2014-12-21 DIAGNOSIS — Z9089 Acquired absence of other organs: Secondary | ICD-10-CM | POA: Insufficient documentation

## 2014-12-21 DIAGNOSIS — R1084 Generalized abdominal pain: Secondary | ICD-10-CM | POA: Diagnosis not present

## 2014-12-21 DIAGNOSIS — Z8679 Personal history of other diseases of the circulatory system: Secondary | ICD-10-CM | POA: Diagnosis not present

## 2014-12-21 DIAGNOSIS — R109 Unspecified abdominal pain: Secondary | ICD-10-CM | POA: Diagnosis present

## 2014-12-21 DIAGNOSIS — M549 Dorsalgia, unspecified: Secondary | ICD-10-CM

## 2014-12-21 DIAGNOSIS — Z880 Allergy status to penicillin: Secondary | ICD-10-CM | POA: Diagnosis not present

## 2014-12-21 LAB — CBC WITH DIFFERENTIAL/PLATELET
BASOS PCT: 0 % (ref 0–1)
Basophils Absolute: 0 10*3/uL (ref 0.0–0.1)
Eosinophils Absolute: 0.3 10*3/uL (ref 0.0–0.7)
Eosinophils Relative: 5 % (ref 0–5)
HCT: 37.8 % (ref 36.0–46.0)
HEMOGLOBIN: 12.4 g/dL (ref 12.0–15.0)
Lymphocytes Relative: 26 % (ref 12–46)
Lymphs Abs: 1.9 10*3/uL (ref 0.7–4.0)
MCH: 30.8 pg (ref 26.0–34.0)
MCHC: 32.8 g/dL (ref 30.0–36.0)
MCV: 94 fL (ref 78.0–100.0)
MONOS PCT: 6 % (ref 3–12)
Monocytes Absolute: 0.4 10*3/uL (ref 0.1–1.0)
NEUTROS ABS: 4.6 10*3/uL (ref 1.7–7.7)
NEUTROS PCT: 63 % (ref 43–77)
Platelets: 278 10*3/uL (ref 150–400)
RBC: 4.02 MIL/uL (ref 3.87–5.11)
RDW: 13.5 % (ref 11.5–15.5)
WBC: 7.2 10*3/uL (ref 4.0–10.5)

## 2014-12-21 LAB — COMPREHENSIVE METABOLIC PANEL WITH GFR
ALT: 40 U/L — ABNORMAL HIGH (ref 0–35)
AST: 34 U/L (ref 0–37)
Albumin: 3.9 g/dL (ref 3.5–5.2)
Alkaline Phosphatase: 79 U/L (ref 39–117)
Anion gap: 8 (ref 5–15)
BUN: 12 mg/dL (ref 6–23)
CO2: 28 mmol/L (ref 19–32)
Calcium: 9.5 mg/dL (ref 8.4–10.5)
Chloride: 104 mmol/L (ref 96–112)
Creatinine, Ser: 0.88 mg/dL (ref 0.50–1.10)
GFR calc Af Amer: >90 mL/min
GFR calc non Af Amer: 87 mL/min — ABNORMAL LOW
Glucose, Bld: 92 mg/dL (ref 70–99)
Potassium: 4 mmol/L (ref 3.5–5.1)
Sodium: 140 mmol/L (ref 135–145)
Total Bilirubin: 0.6 mg/dL (ref 0.3–1.2)
Total Protein: 7 g/dL (ref 6.0–8.3)

## 2014-12-21 LAB — URINALYSIS, ROUTINE W REFLEX MICROSCOPIC
Bilirubin Urine: NEGATIVE
Glucose, UA: NEGATIVE mg/dL
Hgb urine dipstick: NEGATIVE
Ketones, ur: NEGATIVE mg/dL
Nitrite: NEGATIVE
Protein, ur: NEGATIVE mg/dL
Specific Gravity, Urine: 1.025 (ref 1.005–1.030)
Urobilinogen, UA: 0.2 mg/dL (ref 0.0–1.0)
pH: 6.5 (ref 5.0–8.0)

## 2014-12-21 LAB — I-STAT TROPONIN, ED: TROPONIN I, POC: 0 ng/mL (ref 0.00–0.08)

## 2014-12-21 LAB — URINE MICROSCOPIC-ADD ON

## 2014-12-21 LAB — PREGNANCY, URINE: Preg Test, Ur: NEGATIVE

## 2014-12-21 LAB — LIPASE, BLOOD: Lipase: 25 U/L (ref 11–59)

## 2014-12-21 MED ORDER — PROMETHAZINE HCL 25 MG/ML IJ SOLN
12.5000 mg | Freq: Once | INTRAMUSCULAR | Status: AC
  Administered 2014-12-21: 12.5 mg via INTRAVENOUS
  Filled 2014-12-21: qty 1

## 2014-12-21 MED ORDER — MORPHINE SULFATE 4 MG/ML IJ SOLN
4.0000 mg | Freq: Once | INTRAMUSCULAR | Status: AC
  Administered 2014-12-21: 4 mg via INTRAVENOUS
  Filled 2014-12-21: qty 1

## 2014-12-21 MED ORDER — FENTANYL CITRATE 0.05 MG/ML IJ SOLN
50.0000 ug | Freq: Once | INTRAMUSCULAR | Status: AC
  Administered 2014-12-21: 50 ug via INTRAVENOUS
  Filled 2014-12-21: qty 2

## 2014-12-21 NOTE — ED Notes (Addendum)
Patient requesting something else for pain stating that the medication we gave her did not give her any relief. PA Patel-Mills made aware

## 2014-12-21 NOTE — ED Notes (Addendum)
Pt reports "upper, all over" abdominal pain x 2 weeks with vomiting off and on for the past 5 days. Pt states pain radiates to lower back and chest. Pt reports she has been taking extra strength tylenol for pain with no relief and has a pcp appt in [place] next week. Pt states, "I can't take a lot of pain meds other people take, the only things I can normally take are morphine and dilaudid."

## 2014-12-21 NOTE — ED Provider Notes (Signed)
CSN: [id number]     Arrival date & time 12/21/14  1751 History   First MD Initiated Contact with Patient 12/21/14 2017     Chief Complaint  Patient presents with  . Chest Pain  . Abdominal Pain  . Vomiting     (Consider location/radiation/quality/duration/timing/severity/associated sxs/prior Treatment) The history is provided by the patient. No language interpreter was used.  Paula Little is a 29 y.o white female with a history of migraines and kidney stones who presents for  2 weeks of gradual onset and worsening abdominal pain and 6 days of nausea and vomiting.  She states nothing makes it better or worse. She states the pain is 8/10.The pain radiates to the right CVA. She has taken tylenol and a phenergan suppository without relief. She denies any fever, chills, chest pain, shortness of breath, dysuria, hematuria, or urinary frequency.    Past Medical History  Diagnosis Date  . Kidney stones   . Anemia   . Chronic abdominal pain    Past Surgical History  Procedure Laterality Date  . Cholecystectomy     No family history on file. History  Substance Use Topics  . Smoking status: Current Every Day Smoker -- 0.50 packs/day    Types: Cigarettes  . Smokeless tobacco: Not on file  . Alcohol Use: No   OB History    No data available     Review of Systems  Gastrointestinal: Negative for diarrhea and blood in stool.  Musculoskeletal: Negative for back pain.      Allergies  Motrin; Compazine; Donnatal; Haldol; Latex; Lidocaine; Maalox; Mushroom extract complex; Orange fruit; Other; Penicillins; Reglan; Toradol; Tramadol; and Zofran  Home Medications   Prior to Admission medications   Medication Sig Start Date End Date Taking? Authorizing Provider  ciprofloxacin (CIPRO) 500 MG tablet Take 1 tablet (500 mg total) by mouth 2 (two) times daily. 12/23/14   Earley Favor, NP  diclofenac (VOLTAREN) 50 MG EC tablet Take 1 tablet (50 mg total) by mouth 2 (two) times daily. Patient  not taking: Reported on 12/21/2014 11/01/14   Francee Piccolo, PA-C  DULoxetine (CYMBALTA) 30 MG capsule Take 30 mg by mouth daily. 12/20/14 12/20/15  Historical Provider, MD  metroNIDAZOLE (FLAGYL) 500 MG tablet Take 1 tablet (500 mg total) by mouth 2 (two) times daily. Patient not taking: Reported on 12/21/2014 11/01/14   Francee Piccolo, PA-C  promethazine (PHENERGAN) 25 MG tablet Take 1 tablet (25 mg total) by mouth every 6 (six) hours as needed for nausea or vomiting. Patient not taking: Reported on 11/01/2014 11/25/13   Wayland Salinas, MD  promethazine (PHENERGAN) 25 MG tablet Take 1 tablet (25 mg total) by mouth every 6 (six) hours as needed for nausea or vomiting. Patient not taking: Reported on 12/21/2014 11/01/14   Francee Piccolo, PA-C  QUEtiapine (SEROQUEL) 100 MG tablet Take 1-2 tablets by mouth 2 (two) times daily. 1 tab in the morning and 2 tabs at bedtime. 12/20/14   Historical Provider, MD   BP 113/96 mmHg  Pulse 88  Temp(Src) 98 F (36.7 C)  Resp 17  Ht  (1.6 m)  Wt 212 lb (96.163 kg)  BMI 37.56 kg/m2  SpO2 98%  LMP 11/14/2014 Physical Exam  Constitutional: She is oriented to person, place, and time. She appears well-developed and well-nourished.  HENT:  Head: Normocephalic and atraumatic.  Eyes: Conjunctivae are normal.  Neck: Normal range of motion. Neck supple.  Cardiovascular: Normal rate, regular rhythm and normal heart sounds.   Pulmonary/Chest:  Effort normal and breath sounds normal.  Abdominal: Soft. She exhibits no distension and no mass. There is generalized tenderness. There is no rebound and no guarding. No hernia.  Obese abdomen. Right CVA tenderness to palpation.   Musculoskeletal: Normal range of motion.  Neurological: She is alert and oriented to person, place, and time.  Skin: Skin is warm and dry.  Nursing note and vitals reviewed.   ED Course  Procedures (including critical care time) Labs Review Labs Reviewed  COMPREHENSIVE METABOLIC  PANEL - Abnormal; Notable for the following:    ALT 40 (*)    GFR calc non Af Amer 87 (*)    All other components within normal limits  URINALYSIS, ROUTINE W REFLEX MICROSCOPIC - Abnormal; Notable for the following:    Leukocytes, UA TRACE (*)    All other components within normal limits  URINE MICROSCOPIC-ADD ON - Abnormal; Notable for the following:    Squamous Epithelial / LPF FEW (*)    Bacteria, UA FEW (*)    All other components within normal limits  LIPASE, BLOOD  CBC WITH DIFFERENTIAL/PLATELET  PREGNANCY, URINE  I-STAT TROPOININ, ED    Imaging Review Ct Abdomen Pelvis Wo Contrast  12/21/2014   CLINICAL DATA:  Right flank pain, history of stones  EXAM: CT ABDOMEN AND PELVIS WITHOUT CONTRAST  TECHNIQUE: Multidetector CT imaging of the abdomen and pelvis was performed following the standard protocol without IV contrast.  COMPARISON:  CT scan 07/02/2013  FINDINGS: Lung bases are unremarkable. Degenerative changes thoracic spine. The patient is status postcholecystectomy. Unenhanced liver shows no biliary ductal dilatation. Unenhanced pancreas, spleen and adrenal glands are unremarkable. Unenhanced kidneys are symmetrical in size. No nephrolithiasis. No hydronephrosis or hydroureter.  No calcified ureteral calculi are noted bilaterally.  There is no small bowel obstruction. No aortic aneurysm. No ascites or free air. No adenopathy. Moderate stool noted in right colon and cecum. No pericecal inflammation. Normal retrocecal appendix. The terminal ileum is unremarkable. The uterus and adnexa are unremarkable. The urinary bladder is under distended grossly unremarkable. Pelvic phleboliths are noted.  IMPRESSION: 1. There is no nephrolithiasis.  No hydronephrosis or hydroureter. 2. No calcified ureteral calculi. 3. No pericecal inflammation.  Normal appendix. 4. No small bowel obstruction.   Electronically Signed   By: Natasha Mead M.D.   On: 12/21/2014 22:50     EKG Interpretation   Date/Time:   Tuesday December 21 2014 18:21:17 EDT Ventricular Rate:  86 PR Interval:  172 QRS Duration: 72 QT Interval:  356 QTC Calculation: 426 R Axis:   77 Text Interpretation:  Normal sinus rhythm Normal ECG ED PHYSICIAN  INTERPRETATION AVAILABLE IN [facility] Confirmed by TEST, Record  ([id number]) on 12/23/2014 8:07:46 AM      MDM   Final diagnoses:  CVA tenderness  Generalized abdominal pain   Patient complains of generalized abdominal pain and N/V for 2 weeks. On exam she has right CVA tenderness and no focal abdominal tenderness.  Patient is not pregnant. No UTI. Normal Labs and negative troponin.  Normal EKG.  I doubt this is cardiac related.  CT shows no kidney stones or hydronephrosis. She has a normal appendix and no small bowel obstruction. She is asking for pain medication.  I have treated her pain while in the ED. I explained that I could not give her narcotic pain medication to go home with since everything was normal.   She has had no nausea or vomiting while in ED.  She is able  to eat crackers and drink coffee in ED without vomiting.   She [place] [place].  I gave her the resource guide to find a provider if she decides to stay in the area.     Catha GosselinHanna Patel-Mills, PA-C 12/23/14 1527  Nelva Nayobert Beaton, MD 12/24/14 [id number]

## 2014-12-21 NOTE — ED Notes (Signed)
Pt here for chest pain over a week, abd pain all over and vomiting. The abd pain has been for two weeks and the vomiting has bee off and on for 5 days. Denies any diarrhea.

## 2014-12-21 NOTE — Discharge Instructions (Signed)
Abdominal Pain Keep [place].  I have given you [place] and wellness for follow up if [place]. Take tylenol or motrin for pain.  Many things can cause abdominal pain. Usually, abdominal pain is not caused by a disease and will improve without treatment. It can often be observed and treated at home. Your health care provider will do a physical exam and possibly order blood tests and X-rays to help determine the seriousness of your pain. However, in many cases, more time must pass before a clear cause of the pain can be found. Before that point, your health care provider may not know if you need more testing or further treatment. HOME CARE INSTRUCTIONS  Monitor your abdominal pain for any changes. The following actions may help to alleviate any discomfort you are experiencing:  Only take over-the-counter or prescription medicines as directed by your health care provider.  Do not take laxatives unless directed to do so by your health care provider.  Try a clear liquid diet (broth, tea, or water) as directed by your health care provider. Slowly move to a bland diet as tolerated. SEEK MEDICAL CARE IF:  You have unexplained abdominal pain.  You have abdominal pain associated with nausea or diarrhea.  You have pain when you urinate or have a bowel movement.  You experience abdominal pain that wakes you in the night.  You have abdominal pain that is worsened or improved by eating food.  You have abdominal pain that is worsened with eating fatty foods.  You have a fever. SEEK IMMEDIATE MEDICAL CARE IF:   Your pain does not go away within 2 hours.  You keep throwing up (vomiting).  Your pain is felt only in portions of the abdomen, such as the right side or the left lower portion of the abdomen.  You pass bloody or black tarry stools. MAKE SURE YOU:  Understand these instructions.   Will watch your  condition.   Will get help right away if you are not doing well or get worse.  Document Released: 06/06/2005 Document Revised: 09/01/2013 Document Reviewed: 05/06/2013 [facility] Patient Information 2015 [place] [place]. This information is not intended to replace advice given to you by your health care provider. Make sure you discuss any questions you have with your health care provider.

## 2014-12-22 ENCOUNTER — Emergency Department (HOSPITAL_COMMUNITY)
Admission: EM | Admit: 2014-12-22 | Discharge: 2014-12-23 | Disposition: A | Discharge status: Home / Self Care | Payer: Medicare Other | Attending: Emergency Medicine | Admitting: Emergency Medicine

## 2014-12-22 ENCOUNTER — Encounter (HOSPITAL_COMMUNITY): Payer: Self-pay

## 2014-12-22 DIAGNOSIS — R1031 Right lower quadrant pain: Secondary | ICD-10-CM | POA: Diagnosis present

## 2014-12-22 DIAGNOSIS — Z91040 Latex allergy status: Secondary | ICD-10-CM | POA: Diagnosis not present

## 2014-12-22 DIAGNOSIS — Z720 Tobacco use: Secondary | ICD-10-CM | POA: Insufficient documentation

## 2014-12-22 DIAGNOSIS — N72 Inflammatory disease of cervix uteri: Secondary | ICD-10-CM | POA: Insufficient documentation

## 2014-12-22 DIAGNOSIS — G8929 Other chronic pain: Secondary | ICD-10-CM | POA: Insufficient documentation

## 2014-12-22 DIAGNOSIS — Z862 Personal history of diseases of the blood and blood-forming organs and certain disorders involving the immune mechanism: Secondary | ICD-10-CM | POA: Insufficient documentation

## 2014-12-22 DIAGNOSIS — Z791 Long term (current) use of non-steroidal anti-inflammatories (NSAID): Secondary | ICD-10-CM | POA: Diagnosis not present

## 2014-12-22 DIAGNOSIS — Z880 Allergy status to penicillin: Secondary | ICD-10-CM | POA: Diagnosis not present

## 2014-12-22 DIAGNOSIS — Z79899 Other long term (current) drug therapy: Secondary | ICD-10-CM | POA: Insufficient documentation

## 2014-12-22 DIAGNOSIS — Z87442 Personal history of urinary calculi: Secondary | ICD-10-CM | POA: Diagnosis not present

## 2014-12-22 DIAGNOSIS — Z792 Long term (current) use of antibiotics: Secondary | ICD-10-CM | POA: Diagnosis not present

## 2014-12-22 NOTE — ED Notes (Signed)
Pt complains of chest pain, lower abd pain and right sided pain for two weeks, she states that she has vomited a few times and she's unable to hold fluids down

## 2014-12-23 DIAGNOSIS — N72 Inflammatory disease of cervix uteri: Secondary | ICD-10-CM | POA: Diagnosis not present

## 2014-12-23 LAB — CBC WITH DIFFERENTIAL/PLATELET
BASOS ABS: 0 10*3/uL (ref 0.0–0.1)
Basophils Relative: 1 % (ref 0–1)
Eosinophils Absolute: 0.2 10*3/uL (ref 0.0–0.7)
Eosinophils Relative: 3 % (ref 0–5)
HCT: 36.2 % (ref 36.0–46.0)
Hemoglobin: 12 g/dL (ref 12.0–15.0)
Lymphocytes Relative: 23 % (ref 12–46)
Lymphs Abs: 1.4 10*3/uL (ref 0.7–4.0)
MCH: 31.3 pg (ref 26.0–34.0)
MCHC: 33.1 g/dL (ref 30.0–36.0)
MCV: 94.3 fL (ref 78.0–100.0)
Monocytes Absolute: 0.4 10*3/uL (ref 0.1–1.0)
Monocytes Relative: 7 % (ref 3–12)
NEUTROS ABS: 4.2 10*3/uL (ref 1.7–7.7)
Neutrophils Relative %: 66 % (ref 43–77)
PLATELETS: 179 10*3/uL (ref 150–400)
RBC: 3.84 MIL/uL — ABNORMAL LOW (ref 3.87–5.11)
RDW: 13.6 % (ref 11.5–15.5)
WBC: 6.3 10*3/uL (ref 4.0–10.5)

## 2014-12-23 LAB — URINALYSIS, ROUTINE W REFLEX MICROSCOPIC
Bilirubin Urine: NEGATIVE
Glucose, UA: NEGATIVE mg/dL
Ketones, ur: NEGATIVE mg/dL
Leukocytes, UA: NEGATIVE
Nitrite: NEGATIVE
Protein, ur: NEGATIVE mg/dL
Specific Gravity, Urine: 1.036 — ABNORMAL HIGH (ref 1.005–1.030)
UROBILINOGEN UA: 0.2 mg/dL (ref 0.0–1.0)
pH: 5.5 (ref 5.0–8.0)

## 2014-12-23 LAB — HIV ANTIBODY (ROUTINE TESTING W REFLEX): HIV Screen 4th Generation wRfx: NONREACTIVE

## 2014-12-23 LAB — COMPREHENSIVE METABOLIC PANEL
ALT: 35 U/L (ref 0–35)
AST: 31 U/L (ref 0–37)
Albumin: 3.9 g/dL (ref 3.5–5.2)
Alkaline Phosphatase: 84 U/L (ref 39–117)
Anion gap: 7 (ref 5–15)
BILIRUBIN TOTAL: 0.7 mg/dL (ref 0.3–1.2)
BUN: 15 mg/dL (ref 6–23)
CHLORIDE: 108 mmol/L (ref 96–112)
CO2: 24 mmol/L (ref 19–32)
CREATININE: 0.71 mg/dL (ref 0.50–1.10)
Calcium: 9.1 mg/dL (ref 8.4–10.5)
GFR calc Af Amer: >90 mL/min (ref >90)
GFR calc non Af Amer: >90 mL/min (ref >90)
Glucose, Bld: 90 mg/dL (ref 70–99)
POTASSIUM: 3.8 mmol/L (ref 3.5–5.1)
Sodium: 139 mmol/L (ref 135–145)
Total Protein: 7 g/dL (ref 6.0–8.3)

## 2014-12-23 LAB — WET PREP, GENITAL
CLUE CELLS WET PREP: NONE SEEN
Trich, Wet Prep: NONE SEEN
YEAST WET PREP: NONE SEEN

## 2014-12-23 LAB — LIPASE, BLOOD: Lipase: 19 U/L (ref 11–59)

## 2014-12-23 LAB — URINE MICROSCOPIC-ADD ON

## 2014-12-23 LAB — RPR: RPR: NONREACTIVE

## 2014-12-23 MED ORDER — SODIUM CHLORIDE 0.9 % IV SOLN
Freq: Once | INTRAVENOUS | Status: AC
  Administered 2014-12-23: 01:00:00 via INTRAVENOUS

## 2014-12-23 MED ORDER — CIPROFLOXACIN HCL 500 MG PO TABS: 500.0000 mg | ORAL_TABLET | Freq: Two times a day (BID) | ORAL | Status: DC

## 2014-12-23 MED ORDER — AZITHROMYCIN 250 MG PO TABS
1000.0000 mg | ORAL_TABLET | Freq: Once | ORAL | Status: AC
  Administered 2014-12-23: 1000 mg via ORAL
  Filled 2014-12-23: qty 4

## 2014-12-23 MED ORDER — CIPROFLOXACIN HCL 500 MG PO TABS
500.0000 mg | ORAL_TABLET | Freq: Once | ORAL | Status: AC
  Administered 2014-12-23: 500 mg via ORAL
  Filled 2014-12-23: qty 1

## 2014-12-23 MED ORDER — PROMETHAZINE HCL 25 MG RE SUPP
25.0000 mg | Freq: Four times a day (QID) | RECTAL | Status: DC | PRN
  Filled 2014-12-23: qty 1

## 2014-12-23 NOTE — ED Notes (Signed)
Pt given crackers.  

## 2014-12-23 NOTE — ED Provider Notes (Signed)
CSN: [id number]     Arrival date & time 12/22/14  2210 History   First MD Initiated Contact with Patient 12/23/14 0028     Chief Complaint  Patient presents with  . Chest Pain     (Consider location/radiation/quality/duration/timing/severity/associated sxs/prior Treatment) HPI Comments: This is a patient with chronic pelvic pain, chronic abdominal pain, chronic chest pain who was seen 2 days ago for same symptoms as today, which include suprapubic pain.  Right flank pain radiating to her suprapubic area, intermittent in nature. Chart reviewed from 2 days ago.  CT scan was normal.  Labs were all normal.  Urine was normal.  Pelvic exam was not done at that time  Patient is a 29 y.o. female presenting with abdominal pain. The history is provided by the patient.  Abdominal Pain Pain radiates to:  Suprapubic region and RLQ Pain severity:  Moderate Onset quality:  Gradual Duration:  2 weeks Timing:  Constant Progression:  Worsening Relieved by:  Nothing Worsened by:  Nothing tried Ineffective treatments:  None tried Associated symptoms: vaginal discharge   Associated symptoms: no constipation, no diarrhea, no dysuria, no fever, no nausea, no shortness of breath, no vaginal bleeding and no vomiting     Past Medical History  Diagnosis Date  . Kidney stones   . Anemia   . Chronic abdominal pain    Past Surgical History  Procedure Laterality Date  . Cholecystectomy     History reviewed. No pertinent family history. History  Substance Use Topics  . Smoking status: Current Every Day Smoker -- 0.50 packs/day    Types: Cigarettes  . Smokeless tobacco: Not on file  . Alcohol Use: No   OB History    No data available     Review of Systems  Constitutional: Negative for fever.  Respiratory: Negative for shortness of breath.   Gastrointestinal: Positive for abdominal pain. Negative for nausea, vomiting, diarrhea and constipation.  Genitourinary: Positive for vaginal discharge.  Negative for dysuria and vaginal bleeding.  Skin: Negative for rash.  All other systems reviewed and are negative.     Allergies  Motrin; Compazine; Donnatal; Haldol; Latex; Lidocaine; Maalox; Mushroom extract complex; Orange fruit; Other; Penicillins; Reglan; Toradol; Tramadol; and Zofran  Home Medications   Prior to Admission medications   Medication Sig Start Date End Date Taking? Authorizing Provider  DULoxetine (CYMBALTA) 30 MG capsule Take 30 mg by mouth daily. 12/20/14 12/20/15 Yes Historical Provider, MD  QUEtiapine (SEROQUEL) 100 MG tablet Take 1-2 tablets by mouth 2 (two) times daily. 1 tab in the morning and 2 tabs at bedtime. 12/20/14  Yes Historical Provider, MD  ciprofloxacin (CIPRO) 500 MG tablet Take 1 tablet (500 mg total) by mouth 2 (two) times daily. 12/23/14   Earley Favor, NP  diclofenac (VOLTAREN) 50 MG EC tablet Take 1 tablet (50 mg total) by mouth 2 (two) times daily. Patient not taking: Reported on 12/21/2014 11/01/14   Francee Piccolo, PA-C  metroNIDAZOLE (FLAGYL) 500 MG tablet Take 1 tablet (500 mg total) by mouth 2 (two) times daily. Patient not taking: Reported on 12/21/2014 11/01/14   Francee Piccolo, PA-C  promethazine (PHENERGAN) 25 MG tablet Take 1 tablet (25 mg total) by mouth every 6 (six) hours as needed for nausea or vomiting. Patient not taking: Reported on 11/01/2014 11/25/13   Wayland Salinas, MD  promethazine (PHENERGAN) 25 MG tablet Take 1 tablet (25 mg total) by mouth every 6 (six) hours as needed for nausea or vomiting. Patient not taking: Reported on  12/21/2014 11/01/14   Victorino DikeJennifer Piepenbrink, PA-C   BP 112/79 mmHg  Pulse 81  Temp(Src) 97.9 F (36.6 C) (Oral)  Resp 22  SpO2 94%  LMP 11/14/2014 Physical Exam  Constitutional: She is oriented to person, place, and time. She appears well-developed and well-nourished.  HENT:  Head: Normocephalic.  Right Ear: External ear normal.  Left Ear: External ear normal.  Eyes: Pupils are equal, round, and  reactive to light.  Neck: Normal range of motion.  Cardiovascular: Normal rate.   Pulmonary/Chest: Effort normal and breath sounds normal.  Abdominal: Soft. Bowel sounds are normal. She exhibits no distension. There is generalized tenderness.  Musculoskeletal: Normal range of motion. She exhibits no edema.  Neurological: She is alert and oriented to person, place, and time.  Skin: Skin is warm. No rash noted. No erythema.  Nursing note and vitals reviewed.   ED Course  Procedures (including critical care time) Labs Review Labs Reviewed  WET PREP, GENITAL - Abnormal; Notable for the following:    WBC, Wet Prep HPF POC MODERATE (*)    All other components within normal limits  CBC WITH DIFFERENTIAL/PLATELET - Abnormal; Notable for the following:    RBC 3.84 (*)    All other components within normal limits  URINALYSIS, ROUTINE W REFLEX MICROSCOPIC - Abnormal; Notable for the following:    APPearance CLOUDY (*)    Specific Gravity, Urine 1.036 (*)    Hgb urine dipstick LARGE (*)    All other components within normal limits  URINE MICROSCOPIC-ADD ON - Abnormal; Notable for the following:    Squamous Epithelial / LPF FEW (*)    All other components within normal limits  COMPREHENSIVE METABOLIC PANEL  LIPASE, BLOOD  RPR  HIV ANTIBODY (ROUTINE TESTING)  GC/CHLAMYDIA PROBE AMP ([place])    Imaging Review Ct Abdomen Pelvis Wo Contrast  12/21/2014   CLINICAL DATA:  Right flank pain, history of stones  EXAM: CT ABDOMEN AND PELVIS WITHOUT CONTRAST  TECHNIQUE: Multidetector CT imaging of the abdomen and pelvis was performed following the standard protocol without IV contrast.  COMPARISON:  CT scan 07/02/2013  FINDINGS: Lung bases are unremarkable. Degenerative changes thoracic spine. The patient is status postcholecystectomy. Unenhanced liver shows no biliary ductal dilatation. Unenhanced pancreas, spleen and adrenal glands are unremarkable. Unenhanced kidneys are symmetrical in size. No  nephrolithiasis. No hydronephrosis or hydroureter.  No calcified ureteral calculi are noted bilaterally.  There is no small bowel obstruction. No aortic aneurysm. No ascites or free air. No adenopathy. Moderate stool noted in right colon and cecum. No pericecal inflammation. Normal retrocecal appendix. The terminal ileum is unremarkable. The uterus and adnexa are unremarkable. The urinary bladder is under distended grossly unremarkable. Pelvic phleboliths are noted.  IMPRESSION: 1. There is no nephrolithiasis.  No hydronephrosis or hydroureter. 2. No calcified ureteral calculi. 3. No pericecal inflammation.  Normal appendix. 4. No small bowel obstruction.   Electronically Signed   By: Natasha MeadLiviu  Pop M.D.   On: 12/21/2014 22:50     EKG Interpretation   Date/Time:  Wednesday December 22 2014 22:31:47 EDT Ventricular Rate:  84 PR Interval:  175 QRS Duration: 72 QT Interval:  364 QTC Calculation: 430 R Axis:   66 Text Interpretation:  Sinus rhythm Confirmed by Whittier PavilionALUMBO-RASCH  MD, APRIL  ([id number]) on 12/22/2014 11:23:09 PM     Labs have been reviewedbeen no change since 2 days before reviewed CT scan from 412 which did not show any pathology to cause patient's symptoms, I doubt  that there has been no acute change since that time, we will not repeat the CT scan tonight.  Pelvic exam was positive for cervical motion tenderness.  There was a positive discharge.  Patient has been treated with azithromycin and Cipro due to patient's penicillin allergy.  She will be referred to [facility] or primary care provider for further evaluation as needed MDM   Final diagnoses:  Cervicitis         Earley Favor, NP 12/23/14 0425  April Palumbo, MD 12/23/14 [phone]

## 2014-12-23 NOTE — ED Notes (Signed)
Lab has been called to draw new blood orders

## 2014-12-23 NOTE — ED Notes (Signed)
Pt c/o nausea but is refusing phenergan rectal suppository, stating "I don't feel comfortable with that."

## 2014-12-23 NOTE — Discharge Instructions (Signed)
Cervicitis Cervicitis is a soreness and swelling (inflammation) of the cervix. Your cervix is located at the bottom of your uterus. It opens up to the vagina. CAUSES   Sexually transmitted infections (STIs).   Allergic reaction.   Medicines or birth control devices that are put in the vagina.   Injury to the cervix.   Bacterial infections.  RISK FACTORS You are at greater risk if you:  Have unprotected sexual intercourse.  Have sexual intercourse with many partners.  Began sexual intercourse at an early age.  Have a history of STIs. SYMPTOMS  There may be no symptoms. If symptoms occur, they may include:   Gray, white, yellow, or bad-smelling vaginal discharge.   Pain or itching of the area outside the vagina.   Painful sexual intercourse.   Lower abdominal or lower back pain, especially during intercourse.   Frequent urination.   Abnormal vaginal bleeding between periods, after sexual intercourse, or after menopause.   Pressure or a heavy feeling in the pelvis.  DIAGNOSIS  Diagnosis is made after a pelvic exam. Other tests may include:   Examination of any discharge under a microscope (wet prep).   A Pap test.  TREATMENT  Treatment will depend on the cause of cervicitis. If it is caused by an STI, both you and your partner will need to be treated. Antibiotic medicines will be given.  HOME CARE INSTRUCTIONS   Do not have sexual intercourse until your health care provider says it is okay.   Do not have sexual intercourse until your partner has been treated, if your cervicitis is caused by an STI.   Take your antibiotics as directed. Finish them even if you start to feel better.  SEEK MEDICAL CARE IF:  Your symptoms come back.   You have a fever.  MAKE SURE YOU:   Understand these instructions.  Will watch your condition.  Will get help right away if you are not doing well or get worse. Document Released: 08/27/2005 Document Revised:  09/01/2013 Document Reviewed: 02/18/2013 [facility] Patient Information 2015 [place], [place]. This information is not intended to replace advice given to you by your health care provider. Make sure you discuss any questions you have with your health care provider. Please make sure to take all the antibiotic as directed

## 2014-12-24 LAB — GC/CHLAMYDIA PROBE AMP (~~LOC~~) NOT AT ARMC
Chlamydia: NEGATIVE
Neisseria Gonorrhea: NEGATIVE

## 2015-06-06 ENCOUNTER — Emergency Department (HOSPITAL_COMMUNITY)
Admission: EM | Admit: 2015-06-06 | Discharge: 2015-06-07 | Disposition: A | Discharge status: Home / Self Care | Payer: Medicare Other | Attending: Emergency Medicine | Admitting: Emergency Medicine

## 2015-06-06 ENCOUNTER — Emergency Department (HOSPITAL_COMMUNITY): Payer: Medicare Other

## 2015-06-06 ENCOUNTER — Encounter (HOSPITAL_COMMUNITY): Payer: Self-pay | Admitting: Emergency Medicine

## 2015-06-06 DIAGNOSIS — Z9049 Acquired absence of other specified parts of digestive tract: Secondary | ICD-10-CM | POA: Insufficient documentation

## 2015-06-06 DIAGNOSIS — R109 Unspecified abdominal pain: Secondary | ICD-10-CM

## 2015-06-06 DIAGNOSIS — Z792 Long term (current) use of antibiotics: Secondary | ICD-10-CM | POA: Insufficient documentation

## 2015-06-06 DIAGNOSIS — R079 Chest pain, unspecified: Secondary | ICD-10-CM | POA: Diagnosis not present

## 2015-06-06 DIAGNOSIS — R197 Diarrhea, unspecified: Secondary | ICD-10-CM | POA: Insufficient documentation

## 2015-06-06 DIAGNOSIS — Z87442 Personal history of urinary calculi: Secondary | ICD-10-CM | POA: Insufficient documentation

## 2015-06-06 DIAGNOSIS — R300 Dysuria: Secondary | ICD-10-CM | POA: Diagnosis not present

## 2015-06-06 DIAGNOSIS — Z880 Allergy status to penicillin: Secondary | ICD-10-CM | POA: Insufficient documentation

## 2015-06-06 DIAGNOSIS — Z862 Personal history of diseases of the blood and blood-forming organs and certain disorders involving the immune mechanism: Secondary | ICD-10-CM | POA: Diagnosis not present

## 2015-06-06 DIAGNOSIS — G8929 Other chronic pain: Secondary | ICD-10-CM | POA: Diagnosis not present

## 2015-06-06 DIAGNOSIS — R1084 Generalized abdominal pain: Secondary | ICD-10-CM | POA: Diagnosis present

## 2015-06-06 DIAGNOSIS — R1030 Lower abdominal pain, unspecified: Secondary | ICD-10-CM | POA: Diagnosis not present

## 2015-06-06 DIAGNOSIS — Z91040 Latex allergy status: Secondary | ICD-10-CM | POA: Insufficient documentation

## 2015-06-06 DIAGNOSIS — R112 Nausea with vomiting, unspecified: Secondary | ICD-10-CM | POA: Diagnosis not present

## 2015-06-06 DIAGNOSIS — Z720 Tobacco use: Secondary | ICD-10-CM | POA: Insufficient documentation

## 2015-06-06 DIAGNOSIS — Z3202 Encounter for pregnancy test, result negative: Secondary | ICD-10-CM | POA: Insufficient documentation

## 2015-06-06 LAB — BASIC METABOLIC PANEL
Anion gap: 9 (ref 5–15)
BUN: 6 mg/dL (ref 6–20)
CALCIUM: 9.4 mg/dL (ref 8.9–10.3)
CO2: 25 mmol/L (ref 22–32)
CREATININE: 0.86 mg/dL (ref 0.44–1.00)
Chloride: 105 mmol/L (ref 101–111)
GFR calc non Af Amer: >60 mL/min (ref >60)
Glucose, Bld: 108 mg/dL — ABNORMAL HIGH (ref 65–99)
Potassium: 3.9 mmol/L (ref 3.5–5.1)
Sodium: 139 mmol/L (ref 135–145)

## 2015-06-06 LAB — CBC
HCT: 39.1 % (ref 36.0–46.0)
HEMOGLOBIN: 13 g/dL (ref 12.0–15.0)
MCH: 30.4 pg (ref 26.0–34.0)
MCHC: 33.2 g/dL (ref 30.0–36.0)
MCV: 91.6 fL (ref 78.0–100.0)
PLATELETS: 257 10*3/uL (ref 150–400)
RBC: 4.27 MIL/uL (ref 3.87–5.11)
RDW: 13.9 % (ref 11.5–15.5)
WBC: 7.1 10*3/uL (ref 4.0–10.5)

## 2015-06-06 LAB — I-STAT TROPONIN, ED: TROPONIN I, POC: 0 ng/mL (ref 0.00–0.08)

## 2015-06-06 MED ORDER — PROMETHAZINE HCL 25 MG/ML IJ SOLN
25.0000 mg | Freq: Once | INTRAMUSCULAR | Status: AC
  Administered 2015-06-07: 25 mg via INTRAVENOUS
  Filled 2015-06-06: qty 1

## 2015-06-06 NOTE — ED Notes (Signed)
Dr. Campos at bedside   

## 2015-06-06 NOTE — ED Notes (Addendum)
Pt. reports intermittent left chest pain onset last week with SOB , emesis and diarrhea with generalized abdominal pain for 2 weeks .

## 2015-06-06 NOTE — ED Provider Notes (Signed)
CSN: [id number]     Arrival date & time 06/06/15  2026 History  By signing my name below, I, Phillis Haggis, attest that this documentation has been prepared under the direction and in the presence of Azalia Bilis, MD. Electronically Signed: Phillis Haggis, ED Scribe. 06/06/2015. 12:25 AM.  Chief Complaint  Patient presents with  . Chest Pain  . Abdominal Pain   The history is provided by the patient. No language interpreter was used.  HPI Comments: Paula Little is a 29 y.o. female with hx of cholecystectomy, cysts, IBS, kidney stones and chronic abdominal pain who presents to the Emergency Department complaining of intermittent generalized abdominal pain onset 2 weeks ago, waxing and waning left chest pain onset 1 week ago, and nausea and vomiting onset 5 days ago. Pt reports associated diarrhea and dysuria. Pt reports taking Tylenol and Phenergan to no relief. States she is unable to keep food and liquids down. Reports that her chronic abdominal pain is usually lower, and one episode of upper, but never both at the same time. Pt states that she has a hx of suicide attempts and SI, but denies trying to overdose on pain medication. Pt denies sick contacts, hx of liver disease, or drinking alcohol daily. Denies worsening or relieving factors. Denies fever or frequency. Pt states that she is from [facility] and is unable to see her PCP until October.   Past Medical History  Diagnosis Date  . Kidney stones   . Anemia   . Chronic abdominal pain    Past Surgical History  Procedure Laterality Date  . Cholecystectomy     No family history on file. Social History  Substance Use Topics  . Smoking status: Current Every Day Smoker -- 0.00 packs/day    Types: Cigarettes  . Smokeless tobacco: None  . Alcohol Use: No   OB History    No data available     Review of Systems  Constitutional: Negative for fever.  Cardiovascular: Positive for chest pain.  Gastrointestinal: Positive for  nausea, vomiting, abdominal pain and diarrhea.  Genitourinary: Positive for dysuria. Negative for frequency.  All other systems reviewed and are negative.  Allergies  Motrin; Compazine; Donnatal; Haldol; Latex; Lidocaine; Maalox; Mushroom extract complex; Orange fruit; Other; Penicillins; Reglan; Toradol; Tramadol; and Zofran  Home Medications   Prior to Admission medications   Medication Sig Start Date End Date Taking? Authorizing Provider  ciprofloxacin (CIPRO) 500 MG tablet Take 1 tablet (500 mg total) by mouth 2 (two) times daily. 12/23/14   Earley Favor, NP  diclofenac (VOLTAREN) 50 MG EC tablet Take 1 tablet (50 mg total) by mouth 2 (two) times daily. Patient not taking: Reported on 12/21/2014 11/01/14   Francee Piccolo, PA-C  DULoxetine (CYMBALTA) 30 MG capsule Take 30 mg by mouth daily. 12/20/14 12/20/15  Historical Provider, MD  metroNIDAZOLE (FLAGYL) 500 MG tablet Take 1 tablet (500 mg total) by mouth 2 (two) times daily. Patient not taking: Reported on 12/21/2014 11/01/14   Francee Piccolo, PA-C  promethazine (PHENERGAN) 25 MG tablet Take 1 tablet (25 mg total) by mouth every 6 (six) hours as needed for nausea or vomiting. Patient not taking: Reported on 11/01/2014 11/25/13   Wayland Salinas, MD  promethazine (PHENERGAN) 25 MG tablet Take 1 tablet (25 mg total) by mouth every 6 (six) hours as needed for nausea or vomiting. Patient not taking: Reported on 12/21/2014 11/01/14   Francee Piccolo, PA-C  QUEtiapine (SEROQUEL) 100 MG tablet Take 1-2 tablets by mouth 2 (  two) times daily. 1 tab in the morning and 2 tabs at bedtime. 12/20/14   Historical Provider, MD   BP 119/74 mmHg  Pulse 74  Temp(Src) 98.8 F (37.1 C) (Oral)  Resp 26  Ht  (1.575 m)  Wt 203 lb (92.08 kg)  BMI 37.12 kg/m2  SpO2 98%  LMP 05/17/2015  Physical Exam  Constitutional: She is oriented to person, place, and time. She appears well-developed and well-nourished.  HENT:  Head: Normocephalic.  Eyes: EOM  are normal.  Neck: Normal range of motion.  Cardiovascular: Normal rate.   Pulmonary/Chest: Effort normal. She exhibits no tenderness.  Abdominal: Soft. She exhibits no distension. There is tenderness in the suprapubic area. There is no rebound and no guarding.  Mild suprapubic tenderness  Musculoskeletal: Normal range of motion.  Neurological: She is alert and oriented to person, place, and time.  Psychiatric: She has a normal mood and affect.  Nursing note and vitals reviewed.   ED Course  Procedures (including critical care time) DIAGNOSTIC STUDIES: Oxygen Saturation is 98% on RA, normal by my interpretation.    COORDINATION OF CARE: 11:36 PM-Discussed treatment plan which includes labs and IV fluids with pt at bedside and pt agreed to plan.   Labs Review Labs Reviewed  BASIC METABOLIC PANEL - Abnormal; Notable for the following:    Glucose, Bld 108 (*)    All other components within normal limits  URINALYSIS, ROUTINE W REFLEX MICROSCOPIC (NOT AT [facility]) - Abnormal; Notable for the following:    APPearance CLOUDY (*)    Bilirubin Urine SMALL (*)    All other components within normal limits  CBC  URINE RAPID DRUG SCREEN, HOSP PERFORMED  PREGNANCY, URINE  I-STAT TROPOININ, ED   Imaging Review Dg Chest 2 View  06/06/2015   CLINICAL DATA:  Left-sided chest pain  EXAM: CHEST  2 VIEW  COMPARISON:  11/26/2013  FINDINGS: Mild hypoventilation. Minimal opacity over the cardiac apex in lateral projection correlates with mild scarring or atelectasis seen on abdominal CT 12/21/2014. There is no edema, consolidation, effusion, or pneumothorax. Normal heart size and mediastinal contours. No acute osseous findings. Cholecystectomy changes.  IMPRESSION: No evidence of acute cardiopulmonary disease.   Electronically Signed   By: Marnee Spring M.D.   On: 06/06/2015 23:22  I personally reviewed the imaging tests through PACS system I reviewed available ER/hospitalization records through the  EMR    EKG Interpretation   Date/Time:  Monday June 06 2015 20:37:38 EDT Ventricular Rate:  86 PR Interval:  172 QRS Duration: 72 QT Interval:  342 QTC Calculation: 409 R Axis:   84 Text Interpretation:  Normal sinus rhythm with sinus arrhythmia Normal ECG  No significant change was found Confirmed by CAMPOS  MD, KEVIN ([id number]) on  06/06/2015 11:34:08 PM      MDM   Final diagnoses:  None    Patient is a long-standing history of recurrent abdominal pain.  This is likely IBS.  Patient feels better in the emergency department this time.  Labs and workup without significant abnormality.  In regards to her intermittent chest pain her EKG demonstrates no ischemic changes.  Her troponin is negative.  Discharge home with antinausea medication and nonnarcotic medications for her symptoms.  I've instructed her to follow-up with primary care physician.   I personally performed the services described in this documentation, which was scribed in my presence. The recorded information has been reviewed and is accurate.       Azalia Bilis,  MD 06/07/15 [phone]

## 2015-06-07 DIAGNOSIS — R1030 Lower abdominal pain, unspecified: Secondary | ICD-10-CM | POA: Diagnosis not present

## 2015-06-07 LAB — URINALYSIS, ROUTINE W REFLEX MICROSCOPIC
Glucose, UA: NEGATIVE mg/dL
Hgb urine dipstick: NEGATIVE
Ketones, ur: NEGATIVE mg/dL
Leukocytes, UA: NEGATIVE
Nitrite: NEGATIVE
Protein, ur: NEGATIVE mg/dL
Specific Gravity, Urine: 1.029 (ref 1.005–1.030)
Urobilinogen, UA: 1 mg/dL (ref 0.0–1.0)
pH: 6.5 (ref 5.0–8.0)

## 2015-06-07 LAB — RAPID URINE DRUG SCREEN, HOSP PERFORMED
Amphetamines: NOT DETECTED
Barbiturates: NOT DETECTED
Benzodiazepines: NOT DETECTED
Cocaine: NOT DETECTED
Opiates: NOT DETECTED
Tetrahydrocannabinol: NOT DETECTED

## 2015-06-07 LAB — PREGNANCY, URINE: Preg Test, Ur: NEGATIVE

## 2015-06-07 MED ORDER — PROMETHAZINE HCL 25 MG PO TABS: 25.0000 mg | ORAL_TABLET | Freq: Four times a day (QID) | ORAL | Status: DC | PRN

## 2015-06-07 NOTE — Discharge Instructions (Signed)

## 2015-06-07 NOTE — ED Notes (Signed)
Urine specimen sent to lab

## 2015-07-06 ENCOUNTER — Emergency Department (HOSPITAL_COMMUNITY)
Admission: EM | Admit: 2015-07-06 | Discharge: 2015-07-06 | Disposition: A | Discharge status: Home / Self Care | Payer: Medicare Other | Attending: Emergency Medicine | Admitting: Emergency Medicine

## 2015-07-06 ENCOUNTER — Emergency Department (HOSPITAL_COMMUNITY): Payer: Medicare Other

## 2015-07-06 ENCOUNTER — Encounter (HOSPITAL_COMMUNITY): Payer: Self-pay | Admitting: Emergency Medicine

## 2015-07-06 DIAGNOSIS — M545 Low back pain, unspecified: Secondary | ICD-10-CM

## 2015-07-06 DIAGNOSIS — G8929 Other chronic pain: Secondary | ICD-10-CM | POA: Insufficient documentation

## 2015-07-06 DIAGNOSIS — Z862 Personal history of diseases of the blood and blood-forming organs and certain disorders involving the immune mechanism: Secondary | ICD-10-CM | POA: Diagnosis not present

## 2015-07-06 DIAGNOSIS — R1030 Lower abdominal pain, unspecified: Secondary | ICD-10-CM | POA: Insufficient documentation

## 2015-07-06 DIAGNOSIS — Z87442 Personal history of urinary calculi: Secondary | ICD-10-CM | POA: Insufficient documentation

## 2015-07-06 DIAGNOSIS — Z91040 Latex allergy status: Secondary | ICD-10-CM | POA: Diagnosis not present

## 2015-07-06 DIAGNOSIS — Z880 Allergy status to penicillin: Secondary | ICD-10-CM | POA: Insufficient documentation

## 2015-07-06 DIAGNOSIS — Z3202 Encounter for pregnancy test, result negative: Secondary | ICD-10-CM | POA: Diagnosis not present

## 2015-07-06 DIAGNOSIS — R079 Chest pain, unspecified: Secondary | ICD-10-CM | POA: Insufficient documentation

## 2015-07-06 DIAGNOSIS — Z720 Tobacco use: Secondary | ICD-10-CM | POA: Insufficient documentation

## 2015-07-06 LAB — URINALYSIS, ROUTINE W REFLEX MICROSCOPIC
GLUCOSE, UA: NEGATIVE mg/dL
Ketones, ur: NEGATIVE mg/dL
LEUKOCYTES UA: NEGATIVE
Nitrite: NEGATIVE
PH: 6 (ref 5.0–8.0)
PROTEIN: 30 mg/dL — AB
SPECIFIC GRAVITY, URINE: 1.036 — AB (ref 1.005–1.030)
Urobilinogen, UA: 1 mg/dL (ref 0.0–1.0)

## 2015-07-06 LAB — CBC
HEMATOCRIT: 38.9 % (ref 36.0–46.0)
HEMOGLOBIN: 12.9 g/dL (ref 12.0–15.0)
MCH: 29.8 pg (ref 26.0–34.0)
MCHC: 33.2 g/dL (ref 30.0–36.0)
MCV: 89.8 fL (ref 78.0–100.0)
Platelets: 235 10*3/uL (ref 150–400)
RBC: 4.33 MIL/uL (ref 3.87–5.11)
RDW: 13.4 % (ref 11.5–15.5)
WBC: 6.8 10*3/uL (ref 4.0–10.5)

## 2015-07-06 LAB — BASIC METABOLIC PANEL
ANION GAP: 8 (ref 5–15)
BUN: 14 mg/dL (ref 6–20)
CALCIUM: 9.3 mg/dL (ref 8.9–10.3)
CHLORIDE: 106 mmol/L (ref 101–111)
CO2: 22 mmol/L (ref 22–32)
Creatinine, Ser: 0.83 mg/dL (ref 0.44–1.00)
GFR calc non Af Amer: >60 mL/min (ref >60)
GLUCOSE: 119 mg/dL — AB (ref 65–99)
Potassium: 3.7 mmol/L (ref 3.5–5.1)
Sodium: 136 mmol/L (ref 135–145)

## 2015-07-06 LAB — I-STAT TROPONIN, ED: Troponin i, poc: 0 ng/mL (ref 0.00–0.08)

## 2015-07-06 LAB — URINE MICROSCOPIC-ADD ON

## 2015-07-06 LAB — POC URINE PREG, ED: Preg Test, Ur: NEGATIVE

## 2015-07-06 LAB — WET PREP, GENITAL
Trich, Wet Prep: NONE SEEN
WBC, Wet Prep HPF POC: NONE SEEN
Yeast Wet Prep HPF POC: NONE SEEN

## 2015-07-06 MED ORDER — PROMETHAZINE HCL 25 MG PO TABS
25.0000 mg | ORAL_TABLET | Freq: Once | ORAL | Status: DC
  Filled 2015-07-06: qty 1

## 2015-07-06 MED ORDER — METRONIDAZOLE 500 MG PO TABS: 500.0000 mg | ORAL_TABLET | Freq: Two times a day (BID) | ORAL | Status: DC

## 2015-07-06 MED ORDER — DIPHENHYDRAMINE HCL 50 MG/ML IJ SOLN
12.5000 mg | Freq: Once | INTRAMUSCULAR | Status: AC
  Administered 2015-07-06: 12.5 mg via INTRAVENOUS
  Filled 2015-07-06: qty 1

## 2015-07-06 MED ORDER — KETOROLAC TROMETHAMINE 30 MG/ML IJ SOLN
30.0000 mg | Freq: Once | INTRAMUSCULAR | Status: AC
  Administered 2015-07-06: 30 mg via INTRAVENOUS
  Filled 2015-07-06: qty 1

## 2015-07-06 MED ORDER — PROMETHAZINE HCL 25 MG/ML IJ SOLN
6.2500 mg | Freq: Once | INTRAMUSCULAR | Status: AC
  Administered 2015-07-06: 6.25 mg via INTRAVENOUS
  Filled 2015-07-06: qty 1

## 2015-07-06 MED ORDER — SODIUM CHLORIDE 0.9 % IV BOLUS (SEPSIS)
1000.0000 mL | Freq: Once | INTRAVENOUS | Status: AC
  Administered 2015-07-06: 1000 mL via INTRAVENOUS

## 2015-07-06 NOTE — Discharge Instructions (Signed)
Please read attached information. If you experience any new or worsening signs or symptoms please return to the emergency room for evaluation. Please follow-up with your primary care provider or specialist as discussed. Please use medication prescribed only as directed and discontinue taking if you have any concerning signs or symptoms.   °

## 2015-07-06 NOTE — ED Notes (Signed)
Pt refused to take any medications by mouth.

## 2015-07-06 NOTE — ED Notes (Signed)
Pt given sprite 

## 2015-07-06 NOTE — ED Provider Notes (Signed)
CSN: [id number]     Arrival date & time 07/06/15  1806 History   First MD Initiated Contact with Patient 07/06/15 1900     Chief Complaint  Patient presents with  . Chest Pain  . Back Pain    HPI   29 year old female presents today with numerous complaints. Patient reports today that she is having nausea and nonbloody vomiting, this is been present for the last 4 days. She reports that she is in town visiting her grandmother and may have caught a stomach virus. She denies any fever, chills, diarrhea. Patient reports lower abdominal pain and "cramping" with radiation into her vagina. She denies any vaginal discharge. She reports dysuria, with dark colored urine. Patient also reports left CVA pain, that comes and goes, worse with palpation of the back. Patient reports she has tried Tylenol at home with no relief in symptoms. She reports normal bowel movement this morning, no exposure to abnormal food and drink, no one around here displaying similar symptoms. Patient has a significant past medical history of chronic abdominal pain, kidney stones.    Past Medical History  Diagnosis Date  . Kidney stones   . Anemia   . Chronic abdominal pain    Past Surgical History  Procedure Laterality Date  . Cholecystectomy     History reviewed. No pertinent family history. Social History  Substance Use Topics  . Smoking status: Current Every Day Smoker -- 0.00 packs/day    Types: Cigarettes  . Smokeless tobacco: None  . Alcohol Use: No   OB History    No data available     Review of Systems  All other systems reviewed and are negative.   Allergies  Dicyclomine hcl; Motrin; Compazine; Donnatal; Haldol; Latex; Lidocaine; Maalox; Mushroom extract complex; Orange fruit; Other; Penicillins; Reglan; Toradol; Tramadol; and Zofran  Home Medications   Prior to Admission medications   Medication Sig Start Date End Date Taking? Authorizing Provider  metroNIDAZOLE (FLAGYL) 500 MG tablet Take 1  tablet (500 mg total) by mouth 2 (two) times daily. 07/06/15   Eyvonne Mechanic, PA-C  promethazine (PHENERGAN) 25 MG tablet Take 1 tablet (25 mg total) by mouth every 6 (six) hours as needed for nausea or vomiting. 06/07/15   Azalia Bilis, MD   BP 104/55 mmHg  Pulse 89  Temp(Src) 98.2 F (36.8 C) (Oral)  Resp 20  SpO2 96%  LMP 06/16/2015   Physical Exam  Constitutional: She is oriented to person, place, and time. She appears well-developed and well-nourished.  HENT:  Head: Normocephalic and atraumatic.  Eyes: Conjunctivae are normal. Pupils are equal, round, and reactive to light. Right eye exhibits no discharge. Left eye exhibits no discharge. No scleral icterus.  Neck: Normal range of motion. No JVD present. No tracheal deviation present.  Pulmonary/Chest: Effort normal. No stridor.  Abdominal: Soft. She exhibits no distension and no mass. There is tenderness. There is no rebound and no guarding.  Tenderness to palpation of suprapubic region  Neurological: She is alert and oriented to person, place, and time. Coordination normal.  Psychiatric: She has a normal mood and affect. Her behavior is normal. Judgment and thought content normal.  Nursing note and vitals reviewed.   ED Course  Procedures (including critical care time) Labs Review Labs Reviewed  WET PREP, GENITAL - Abnormal; Notable for the following:    Clue Cells Wet Prep HPF POC MANY (*)    All other components within normal limits  BASIC METABOLIC PANEL - Abnormal; Notable for  the following:    Glucose, Bld 119 (*)    All other components within normal limits  URINALYSIS, ROUTINE W REFLEX MICROSCOPIC (NOT AT [facility]) - Abnormal; Notable for the following:    Color, Urine AMBER (*)    APPearance TURBID (*)    Specific Gravity, Urine 1.036 (*)    Hgb urine dipstick SMALL (*)    Bilirubin Urine SMALL (*)    Protein, ur 30 (*)    All other components within normal limits  URINE MICROSCOPIC-ADD ON - Abnormal; Notable for  the following:    Squamous Epithelial / LPF MANY (*)    All other components within normal limits  CBC  RPR  HIV ANTIBODY (ROUTINE TESTING)  POC URINE PREG, ED  I-STAT TROPOININ, ED  GC/CHLAMYDIA PROBE AMP ([place]) NOT AT [facility]    Imaging Review Dg Chest 2 View  07/06/2015  CLINICAL DATA:  29 year old female with acute chest pain for 3 days. EXAM: CHEST  2 VIEW COMPARISON:  06/06/2015 and prior chest radiographs dating back to 03/18/2010 FINDINGS: The cardiomediastinal silhouette is unremarkable. There is no evidence of focal airspace disease, pulmonary edema, suspicious pulmonary nodule/mass, pleural effusion, or pneumothorax. No acute bony abnormalities are identified. IMPRESSION: No active cardiopulmonary disease. Electronically Signed   By: Harmon PierJeffrey  Hu M.D.   On: 07/06/2015 18:43   I have personally reviewed and evaluated these images and lab results as part of my medical decision-making.   EKG Interpretation   Date/Time:  Wednesday July 06 2015 18:09:51 EDT Ventricular Rate:  94 PR Interval:  168 QRS Duration: 74 QT Interval:  332 QTC Calculation: 415 R Axis:   82 Text Interpretation:  Normal sinus rhythm with sinus arrhythmia Normal ECG  No significant change since last tracing Confirmed by Denton LankSTEINL  MD, Caryn BeeKEVIN  ([id number]) on 07/06/2015 6:57:46 PM      MDM   Final diagnoses:  Bilateral low back pain without sciatica  Lower abdominal pain    Labs: Wet prep, RPR, HIV, point occur prior, urinalysis, i-STAT troponin, BMP, CBC- clue cells  Imaging: DG chest 2 view- no active cardiopulmonary disease  Consults:  Therapeutics: Toradol, Benadryl, Phenergan, normal saline  Discharge Meds:   Assessment/Plan: Patient presents with numerous complaints today. Requesting narcotic pain medication for back and chest pain. Back pain is chronic as she has been seen previously for the same with no significant findings. She is tenderness to palpation of the back, says likely  muscular in nature. Patient also has left-sided chest pain, recently seen for the same. Normal Troponin, EKG, chest x-ray. Low suspicion for PE, ACS, or any other concerning pathology. Patient reported that she is allergic to above medications that were administered, and informed her that I would not be giving her narcotics, she rescinded her statement reporting that she can take them through IV is taken through mouth would cause her to throw up. She was given medication here in the ED with symptomatic improvement. Patient was given instructions follow-up with primary care, and strict return precautions. She verbalizes understanding and agreement for today's plan.         Eyvonne MechanicJeffrey Saylor Murry, PA-C 07/07/15 0106  Cathren LaineKevin Steinl, MD 07/11/15 [phone]

## 2015-07-06 NOTE — ED Notes (Signed)
Pt sts CP and back pain with dysuria; pt sts N/V

## 2015-07-07 LAB — GC/CHLAMYDIA PROBE AMP (~~LOC~~) NOT AT ARMC
Chlamydia: NEGATIVE
Neisseria Gonorrhea: NEGATIVE

## 2015-07-07 LAB — HIV ANTIBODY (ROUTINE TESTING W REFLEX): HIV Screen 4th Generation wRfx: NONREACTIVE

## 2015-07-07 LAB — RPR: RPR Ser Ql: NONREACTIVE

## 2015-08-05 ENCOUNTER — Emergency Department (HOSPITAL_COMMUNITY)
Admission: EM | Admit: 2015-08-05 | Discharge: 2015-08-06 | Disposition: A | Discharge status: Home / Self Care | Payer: Medicare Other | Source: Home / Self Care | Attending: Emergency Medicine | Admitting: Emergency Medicine

## 2015-08-05 ENCOUNTER — Emergency Department (HOSPITAL_COMMUNITY): Payer: Medicare Other

## 2015-08-05 ENCOUNTER — Encounter (HOSPITAL_COMMUNITY): Payer: Self-pay | Admitting: Emergency Medicine

## 2015-08-05 DIAGNOSIS — T420X2A Poisoning by hydantoin derivatives, intentional self-harm, initial encounter: Secondary | ICD-10-CM | POA: Diagnosis not present

## 2015-08-05 DIAGNOSIS — R45851 Suicidal ideations: Secondary | ICD-10-CM | POA: Diagnosis not present

## 2015-08-05 DIAGNOSIS — A084 Viral intestinal infection, unspecified: Secondary | ICD-10-CM

## 2015-08-05 LAB — BASIC METABOLIC PANEL
ANION GAP: 9 (ref 5–15)
BUN: 9 mg/dL (ref 6–20)
CALCIUM: 9.7 mg/dL (ref 8.9–10.3)
CO2: 26 mmol/L (ref 22–32)
Chloride: 107 mmol/L (ref 101–111)
Creatinine, Ser: 0.76 mg/dL (ref 0.44–1.00)
Glucose, Bld: 95 mg/dL (ref 65–99)
POTASSIUM: 3.8 mmol/L (ref 3.5–5.1)
SODIUM: 142 mmol/L (ref 135–145)

## 2015-08-05 LAB — I-STAT TROPONIN, ED: TROPONIN I, POC: 0 ng/mL (ref 0.00–0.08)

## 2015-08-05 LAB — CBC
HEMATOCRIT: 36.6 % (ref 36.0–46.0)
HEMOGLOBIN: 12.4 g/dL (ref 12.0–15.0)
MCH: 31.2 pg (ref 26.0–34.0)
MCHC: 33.9 g/dL (ref 30.0–36.0)
MCV: 92 fL (ref 78.0–100.0)
Platelets: 237 10*3/uL (ref 150–400)
RBC: 3.98 MIL/uL (ref 3.87–5.11)
RDW: 13.1 % (ref 11.5–15.5)
WBC: 6.1 10*3/uL (ref 4.0–10.5)

## 2015-08-05 MED ORDER — PROMETHAZINE HCL 25 MG/ML IJ SOLN
25.0000 mg | Freq: Once | INTRAMUSCULAR | Status: AC
  Administered 2015-08-05: 25 mg via INTRAVENOUS
  Filled 2015-08-05: qty 1

## 2015-08-05 NOTE — ED Notes (Signed)
Pt comes to Ed with c/o of abdominal pain with NVD and chestpain. Unable to eat and keep fluids in for the last 5 days.

## 2015-08-05 NOTE — ED Notes (Signed)
Pt. Made aware for the need of urine. 

## 2015-08-05 NOTE — ED Notes (Signed)
Called pt twice for blood draw,  No answer

## 2015-08-05 NOTE — ED Provider Notes (Signed)
CSN: [id number]     Arrival date & time 08/05/15  1741 History   First MD Initiated Contact with Patient 08/05/15 2234     Chief Complaint  Patient presents with  . Chest Pain    left side chest pain and surging to left breast mast area   . Abdominal Pain    navel area to lower left back   . Nausea    x5 days can not keep anything down   . Emesis    x after every meal   . Diarrhea    yellow and watery x 4 days now      (Consider location/radiation/quality/duration/timing/severity/associated sxs/prior Treatment) HPI   Patient is a 29 year old female with past medical history of IBS, kidney stone, chronic abdominal pain who presents to the ED with complaint of abdominal pain, onset 5 days. Patient reports having intermittent "sharp/throbbing" pain to her umbilical region that radiates to her left abdomen, denies any aggravating or alleviating factors. Endorses nausea, NBNB vomiting, nonbloody diarrhea, dysuria. Pt reports she is unable to keep any fluids or food down due to N/V. Denies fever, chills, headache, sore throat, nasal congestion, rhinorrhea, hematuria, vaginal bleeding, vaginal d/c. She notes she took tylenol, naproxen and protonix at home with no relief. She also reports having associated intermittent sharp/burning midsternal CP, no aggravating or alleviating factors. Denies diaphoresis, SOB, cough, wheezing. LMP 07/14/15. Past surgical history includes cholecystectomy.   Past Medical History  Diagnosis Date  . Kidney stones   . Anemia   . Chronic abdominal pain    Past Surgical History  Procedure Laterality Date  . Cholecystectomy     No family history on file. Social History  Substance Use Topics  . Smoking status: Current Every Day Smoker -- 0.00 packs/day    Types: Cigarettes  . Smokeless tobacco: None  . Alcohol Use: No   OB History    No data available     Review of Systems  Cardiovascular: Positive for chest pain.  Gastrointestinal: Positive for nausea,  vomiting, abdominal pain and diarrhea.  Genitourinary: Positive for dysuria.  All other systems reviewed and are negative.     Allergies  Dicyclomine hcl; Motrin; Compazine; Donnatal; Haldol; Latex; Lidocaine; Maalox; Mushroom extract complex; Orange fruit; Other; Penicillins; Reglan; Toradol; Tramadol; and Zofran  Home Medications   Prior to Admission medications   Medication Sig Start Date End Date Taking? Authorizing Provider  acetaminophen (TYLENOL) 325 MG tablet Take 650 mg by mouth every 6 (six) hours as needed for moderate pain.  07/21/15  Yes Historical Provider, MD  OXcarbazepine (TRILEPTAL) 150 MG tablet Take 150 mg by mouth 2 (two) times daily.  08/01/15 07/31/16 Yes Historical Provider, MD  phenytoin (DILANTIN) 100 MG ER capsule Take 100 mg by mouth 2 (two) times daily.  07/21/15  Yes Historical Provider, MD  zolpidem (AMBIEN) 5 MG tablet Take 10 mg by mouth at bedtime.  08/01/15 07/31/16 Yes Historical Provider, MD  azithromycin (ZITHROMAX) 500 MG tablet Take 1,000 mg by mouth daily.  08/11/15   Historical Provider, MD  metroNIDAZOLE (FLAGYL) 500 MG tablet Take 1 tablet (500 mg total) by mouth 2 (two) times daily. Patient not taking: Reported on 08/05/2015 07/06/15   Eyvonne MechanicJeffrey Hedges, PA-C  promethazine (PHENERGAN) 25 MG tablet Take 1 tablet (25 mg total) by mouth every 6 (six) hours as needed for nausea or vomiting. Patient not taking: Reported on 08/05/2015 06/07/15   Azalia BilisKevin Campos, MD   BP 132/68 mmHg  Pulse 84  Temp(Src)  98.3 F (36.8 C) (Oral)  Resp 14  SpO2 99%  LMP 07/17/2015 Physical Exam  Constitutional: She is oriented to person, place, and time. She appears well-developed and well-nourished. No distress.  HENT:  Head: Normocephalic and atraumatic.  Mouth/Throat: Oropharynx is clear and moist. No oropharyngeal exudate.  Eyes: Conjunctivae and EOM are normal. Pupils are equal, round, and reactive to light. Right eye exhibits no discharge. Left eye exhibits no  discharge. No scleral icterus.  Neck: Normal range of motion. Neck supple.  Cardiovascular: Normal rate, regular rhythm, normal heart sounds and intact distal pulses.   Pulmonary/Chest: Effort normal and breath sounds normal. No respiratory distress. She has no wheezes. She has no rales. She exhibits no tenderness.  Abdominal: Soft. Bowel sounds are normal. She exhibits no distension and no mass. There is no tenderness. There is no rebound and no guarding.  Musculoskeletal: Normal range of motion. She exhibits no edema.  Lymphadenopathy:    She has no cervical adenopathy.  Neurological: She is alert and oriented to person, place, and time.  Skin: Skin is warm and dry. She is not diaphoretic.  Nursing note and vitals reviewed.   ED Course  Procedures (including critical care time) Labs Review Labs Reviewed  BASIC METABOLIC PANEL  CBC  I-STAT TROPOININ, ED  Rosezena Sensor, ED    Imaging Review Dg Chest 2 View  08/05/2015  CLINICAL DATA:  Nausea, vomiting, and mid to left-sided angina for 5 days. Unable to keep food or fluids down for 5 days. Weakness. Smoker. EXAM: CHEST  2 VIEW COMPARISON:  07/06/2015 FINDINGS: Shallow inspiration. Normal heart size and pulmonary vascularity. No focal airspace disease or consolidation in the lungs. No blunting of costophrenic angles. No pneumothorax. Mediastinal contours appear intact. Surgical clips in the right upper quadrant. Mild degenerative changes in the spine. IMPRESSION: No active cardiopulmonary disease. Electronically Signed   By: Burman Nieves M.D.   On: 08/05/2015 18:55   I have personally reviewed and evaluated these images and lab results as part of my medical decision-making.    MDM   Final diagnoses:  None    Pt presents with periumbilical abdominal pain with associated N/V/D. She also reports having burning midsernal CP. No relief with Tylenol, naproxen or Protonix at home. Hx of IBS and chronic abdominal pain. VSS. Exam  unremarkable, no abdominal pain or peritoneal signs. Patient given IV fluids and Phenergan. Pregnancy negative. EKG showed sinus rhythm, unchanged. Chest x-ray negative. Troponin negative. Labs unremarkable. UA pending. I have a low suspicion for ACS, PE, dissection, or other acute cardiac event at this time.  I suspect patient's symptoms are likely due to IBS and chronic abdominal pain, no signs of infection.   Hand off to [employer], PA-C, UA pending. Plan to d/c pt home with antiemetics.     Satira Sark [place], [place] 08/06/15 0130  Lavera Guise, MD 08/06/15 [phone]

## 2015-08-06 ENCOUNTER — Encounter (HOSPITAL_COMMUNITY): Payer: Self-pay | Admitting: Emergency Medicine

## 2015-08-06 ENCOUNTER — Inpatient Hospital Stay (HOSPITAL_COMMUNITY)
Admission: EM | Admit: 2015-08-06 | Discharge: 2015-08-11 | DRG: 918 | Payer: Medicare Other | Attending: Internal Medicine | Admitting: Internal Medicine

## 2015-08-06 DIAGNOSIS — T420X2A Poisoning by hydantoin derivatives, intentional self-harm, initial encounter: Secondary | ICD-10-CM | POA: Diagnosis present

## 2015-08-06 DIAGNOSIS — T50902A Poisoning by unspecified drugs, medicaments and biological substances, intentional self-harm, initial encounter: Secondary | ICD-10-CM | POA: Diagnosis not present

## 2015-08-06 DIAGNOSIS — F3162 Bipolar disorder, current episode mixed, moderate: Secondary | ICD-10-CM | POA: Diagnosis not present

## 2015-08-06 DIAGNOSIS — G40909 Epilepsy, unspecified, not intractable, without status epilepticus: Secondary | ICD-10-CM | POA: Diagnosis present

## 2015-08-06 DIAGNOSIS — F319 Bipolar disorder, unspecified: Secondary | ICD-10-CM | POA: Diagnosis present

## 2015-08-06 DIAGNOSIS — R45851 Suicidal ideations: Secondary | ICD-10-CM

## 2015-08-06 DIAGNOSIS — K580 Irritable bowel syndrome with diarrhea: Secondary | ICD-10-CM | POA: Diagnosis present

## 2015-08-06 DIAGNOSIS — Z638 Other specified problems related to primary support group: Secondary | ICD-10-CM | POA: Diagnosis not present

## 2015-08-06 DIAGNOSIS — F3130 Bipolar disorder, current episode depressed, mild or moderate severity, unspecified: Secondary | ICD-10-CM | POA: Diagnosis not present

## 2015-08-06 DIAGNOSIS — G8929 Other chronic pain: Secondary | ICD-10-CM | POA: Diagnosis present

## 2015-08-06 DIAGNOSIS — F332 Major depressive disorder, recurrent severe without psychotic features: Secondary | ICD-10-CM | POA: Diagnosis not present

## 2015-08-06 DIAGNOSIS — G4700 Insomnia, unspecified: Secondary | ICD-10-CM | POA: Diagnosis present

## 2015-08-06 DIAGNOSIS — Y929 Unspecified place or not applicable: Secondary | ICD-10-CM

## 2015-08-06 DIAGNOSIS — T50902D Poisoning by unspecified drugs, medicaments and biological substances, intentional self-harm, subsequent encounter: Secondary | ICD-10-CM | POA: Diagnosis not present

## 2015-08-06 DIAGNOSIS — T1491 Suicide attempt: Secondary | ICD-10-CM | POA: Diagnosis not present

## 2015-08-06 DIAGNOSIS — Z91410 Personal history of adult physical and sexual abuse: Secondary | ICD-10-CM

## 2015-08-06 DIAGNOSIS — F329 Major depressive disorder, single episode, unspecified: Secondary | ICD-10-CM | POA: Diagnosis not present

## 2015-08-06 DIAGNOSIS — T50901A Poisoning by unspecified drugs, medicaments and biological substances, accidental (unintentional), initial encounter: Secondary | ICD-10-CM | POA: Diagnosis present

## 2015-08-06 LAB — URINALYSIS, ROUTINE W REFLEX MICROSCOPIC
Bilirubin Urine: NEGATIVE
GLUCOSE, UA: NEGATIVE mg/dL
KETONES UR: NEGATIVE mg/dL
Leukocytes, UA: NEGATIVE
Nitrite: NEGATIVE
PROTEIN: NEGATIVE mg/dL
Specific Gravity, Urine: 1.031 — ABNORMAL HIGH (ref 1.005–1.030)
pH: 5.5 (ref 5.0–8.0)

## 2015-08-06 LAB — CBC
HCT: 36.2 % (ref 36.0–46.0)
Hemoglobin: 12.1 g/dL (ref 12.0–15.0)
MCH: 30.9 pg (ref 26.0–34.0)
MCHC: 33.4 g/dL (ref 30.0–36.0)
MCV: 92.3 fL (ref 78.0–100.0)
PLATELETS: 212 10*3/uL (ref 150–400)
RBC: 3.92 MIL/uL (ref 3.87–5.11)
RDW: 13.4 % (ref 11.5–15.5)
WBC: 6 10*3/uL (ref 4.0–10.5)

## 2015-08-06 LAB — COMPREHENSIVE METABOLIC PANEL
ALT: 31 U/L (ref 14–54)
AST: 24 U/L (ref 15–41)
Albumin: 3.8 g/dL (ref 3.5–5.0)
Alkaline Phosphatase: 81 U/L (ref 38–126)
Anion gap: 6 (ref 5–15)
BILIRUBIN TOTAL: 0.2 mg/dL — AB (ref 0.3–1.2)
BUN: 8 mg/dL (ref 6–20)
CHLORIDE: 107 mmol/L (ref 101–111)
CO2: 27 mmol/L (ref 22–32)
CREATININE: 0.86 mg/dL (ref 0.44–1.00)
Calcium: 9.1 mg/dL (ref 8.9–10.3)
Glucose, Bld: 80 mg/dL (ref 65–99)
Potassium: 3.7 mmol/L (ref 3.5–5.1)
Sodium: 140 mmol/L (ref 135–145)
TOTAL PROTEIN: 6.4 g/dL — AB (ref 6.5–8.1)

## 2015-08-06 LAB — I-STAT BETA HCG BLOOD, ED (MC, WL, AP ONLY)

## 2015-08-06 LAB — URINE MICROSCOPIC-ADD ON

## 2015-08-06 LAB — RAPID URINE DRUG SCREEN, HOSP PERFORMED
Amphetamines: NOT DETECTED
Barbiturates: NOT DETECTED
Benzodiazepines: NOT DETECTED
Cocaine: NOT DETECTED
OPIATES: NOT DETECTED
Tetrahydrocannabinol: NOT DETECTED

## 2015-08-06 LAB — PREGNANCY, URINE: PREG TEST UR: NEGATIVE

## 2015-08-06 LAB — ETHANOL

## 2015-08-06 LAB — PHENYTOIN LEVEL, TOTAL

## 2015-08-06 LAB — ACETAMINOPHEN LEVEL: Acetaminophen (Tylenol), Serum: <10 ug/mL — ABNORMAL LOW (ref 10–30)

## 2015-08-06 LAB — SALICYLATE LEVEL

## 2015-08-06 LAB — CBG MONITORING, ED: GLUCOSE-CAPILLARY: 90 mg/dL (ref 65–99)

## 2015-08-06 MED ORDER — DIPHENHYDRAMINE HCL 50 MG/ML IJ SOLN
25.0000 mg | Freq: Once | INTRAMUSCULAR | Status: AC
  Administered 2015-08-06: 25 mg via INTRAVENOUS
  Filled 2015-08-06: qty 1

## 2015-08-06 MED ORDER — LACTINEX PO PACK: PACK | ORAL | Status: DC

## 2015-08-06 MED ORDER — SODIUM CHLORIDE 0.9 % IV SOLN
INTRAVENOUS | Status: DC
  Administered 2015-08-06 – 2015-08-10 (×6): via INTRAVENOUS

## 2015-08-06 MED ORDER — PROMETHAZINE HCL 25 MG PO TABS: 25.0000 mg | ORAL_TABLET | Freq: Four times a day (QID) | ORAL | Status: DC | PRN

## 2015-08-06 NOTE — ED Notes (Signed)
Attempted US IV access, unsuccessful. See new orders

## 2015-08-06 NOTE — Discharge Instructions (Signed)
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
## 2015-08-06 NOTE — ED Notes (Signed)
Attempted report x1. 

## 2015-08-06 NOTE — ED Notes (Signed)
Pt reports that she is feeling suicidal and took a handful of dilantin 1 hour PTA..Marland Kitchen

## 2015-08-06 NOTE — ED Notes (Signed)
Pt is sleeping with equal chest rise and fall noted. Significant other is present at the bedside.  No acute distress.

## 2015-08-06 NOTE — ED Provider Notes (Signed)
0210 - Patient care assumed from Melburn HakeNicole Nadeau, PA-C at shift change. Patient pending urinalysis. Plan discussed which includes discharge if urinalysis is negative for infection. Findings reviewed and there is no evidence of UTI. Upreg negative. Will discharge with supportive care. Patient to f/u with a PCP.   Filed Vitals:   08/05/15 2330 08/06/15 0000 08/06/15 0030 08/06/15 0100  BP: 97/66 106/76 106/67 106/61  Pulse: 73 89 76 77  Temp:      TempSrc:      Resp: 22 18 17 19   SpO2: 99% 96% 95% 98%     Antony MaduraKelly Irva Loser, PA-C 08/06/15 [phone]

## 2015-08-06 NOTE — ED Notes (Signed)
Staffing called for sitter.   

## 2015-08-06 NOTE — ED Notes (Signed)
Dr. Plunkett at bedside.  

## 2015-08-06 NOTE — ED Notes (Signed)
[facility] and mental status.
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
## 2015-08-06 NOTE — ED Notes (Signed)
Spoke with Elnita [facility]heryl at [facility] regarding patient's ingestion (10-12 100 MG dilantin ER), monitor for dizziness, ataxia, N/V, nystagmus, sedation/CNS depression. Recommending EKG, IV fluids, and serial dilantin/acetaminophen level four hours post ingestion and dil;antin q4-6 hours until peak and decreasing levels are seen.

## 2015-08-07 DIAGNOSIS — T1491 Suicide attempt: Secondary | ICD-10-CM

## 2015-08-07 DIAGNOSIS — T420X2A Poisoning by hydantoin derivatives, intentional self-harm, initial encounter: Principal | ICD-10-CM

## 2015-08-07 DIAGNOSIS — R45851 Suicidal ideations: Secondary | ICD-10-CM

## 2015-08-07 LAB — BASIC METABOLIC PANEL
ANION GAP: 5 (ref 5–15)
BUN: 10 mg/dL (ref 6–20)
CHLORIDE: 109 mmol/L (ref 101–111)
CO2: 27 mmol/L (ref 22–32)
CREATININE: 0.72 mg/dL (ref 0.44–1.00)
Calcium: 8.5 mg/dL — ABNORMAL LOW (ref 8.9–10.3)
GFR calc non Af Amer: >60 mL/min (ref >60)
Glucose, Bld: 101 mg/dL — ABNORMAL HIGH (ref 65–99)
POTASSIUM: 3.6 mmol/L (ref 3.5–5.1)
SODIUM: 141 mmol/L (ref 135–145)

## 2015-08-07 LAB — CBC
HCT: 32.3 % — ABNORMAL LOW (ref 36.0–46.0)
HEMOGLOBIN: 10.9 g/dL — AB (ref 12.0–15.0)
MCH: 31.3 pg (ref 26.0–34.0)
MCHC: 33.7 g/dL (ref 30.0–36.0)
MCV: 92.8 fL (ref 78.0–100.0)
PLATELETS: 189 10*3/uL (ref 150–400)
RBC: 3.48 MIL/uL — AB (ref 3.87–5.11)
RDW: 13.4 % (ref 11.5–15.5)
WBC: 4.9 10*3/uL (ref 4.0–10.5)

## 2015-08-07 LAB — MRSA PCR SCREENING: MRSA by PCR: POSITIVE — AB

## 2015-08-07 LAB — PHENYTOIN LEVEL, TOTAL
PHENYTOIN LVL: 4.3 ug/mL — AB (ref 10.0–20.0)
Phenytoin Lvl: 4.1 ug/mL — ABNORMAL LOW (ref 10.0–20.0)
Phenytoin Lvl: 4.7 ug/mL — ABNORMAL LOW (ref 10.0–20.0)
Phenytoin Lvl: 4.6 ug/mL — ABNORMAL LOW (ref 10.0–20.0)
Phenytoin Lvl: 4.8 ug/mL — ABNORMAL LOW (ref 10.0–20.0)
Phenytoin Lvl: 4.6 ug/mL — ABNORMAL LOW (ref 10.0–20.0)

## 2015-08-07 MED ORDER — ZOLPIDEM TARTRATE 5 MG PO TABS
5.0000 mg | ORAL_TABLET | Freq: Every day | ORAL | Status: DC
  Administered 2015-08-07 – 2015-08-10 (×4): 5 mg via ORAL
  Filled 2015-08-07 (×4): qty 1

## 2015-08-07 MED ORDER — ALUM & MAG HYDROXIDE-SIMETH 200-200-20 MG/5ML PO SUSP: 30.0000 mL | Freq: Four times a day (QID) | ORAL | Status: DC | PRN

## 2015-08-07 NOTE — Progress Notes (Signed)
Utilization review completed.  
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
## 2015-08-08 LAB — PHENYTOIN LEVEL, TOTAL: Phenytoin Lvl: 4 ug/mL — ABNORMAL LOW (ref 10.0–20.0)

## 2015-08-08 NOTE — Progress Notes (Signed)
Pt transferred from 3S in stable condition. Pt is A&O X4. Suicide Precautions continued. Sitter is at bedside. Will continue to monitor pt. Pt requesting to shower. Paged Dr Mahala MenghiniSamtani regarding this. Dr Mahala MenghiniSamtani said it's okay for pt to shower with supervision. Sitter will remain with pt. Will continue to monitor pt.

## 2015-08-08 NOTE — Progress Notes (Signed)
CM received message from CSW(Gina) regarding inpatient psych bed for pt. CSW  informed CM barrier is payer source and [facility] no beds available, will keep looking. CM to f/u with d/c needs. Gae Gallopngela Yulianna Folse RN,BSN,CM [phone]

## 2015-08-08 NOTE — Care Management Note (Addendum)
Case Management Note  Patient Details  Name: Lamonte RicherDawn Marie Righter MRN: [id number] Date of Birth: [birth year]  Subjective/Objective:    Admitted with suicide attempt, overdose on Dilantin.               Action/Plan:  Psych following, plan: admit to Behavior Health @ d/c.  Expected Discharge Date:                  Expected Discharge Plan:  Psychiatric Hospital  In-House Referral:  Clinical Social Work  Discharge planning Services  CM Consult  Post Acute Care Choice:    Choice offered to:     DME Arranged:    DME Agency:     HH Arranged:    HH Agency:     Status of Service:  Completed, signed off  Medicare Important Message Given:    Date Medicare IM Given:    Medicare IM give by:    Date Additional Medicare IM Given:    Additional Medicare Important Message give by:     If discussed at Long Length of Stay Meetings, dates discussed:    Additional Comments: CM spoke and made referral  with psych CSW, Almira CoasterGina @ [phone] regarding Behavior Health admission @ d/c.  Gae GallopCole, Jessly Lebeck OrleansHudson, ArizonaRN,BSN,CM   [phone]  08/08/2015, 10:30 AM
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
## 2015-08-09 NOTE — Clinical Social Work Note (Signed)
CSW continues attempts to locate placement for patient.  [facility].  Referrals have been sent to the following facilities: - [facility] [facility]- [facility]  Vickii PennaGina Vinita Prentiss, LCSW [phone]  [facility] Licensed Clinical Social Worker
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
## 2015-08-09 NOTE — Care Management Important Message (Signed)
Important Message  Patient Details  Name: Paula Little MRN: [id number] Date of Birth: 1986-01-26   Medicare Important Message Given:  Yes    Elliot CousinShavis, Ardella Chhim Ellen, RN 08/09/2015, 11:57 AM

## 2015-08-10 DIAGNOSIS — T50902A Poisoning by unspecified drugs, medicaments and biological substances, intentional self-harm, initial encounter: Secondary | ICD-10-CM

## 2015-08-10 DIAGNOSIS — G40909 Epilepsy, unspecified, not intractable, without status epilepticus: Secondary | ICD-10-CM

## 2015-08-10 DIAGNOSIS — F3162 Bipolar disorder, current episode mixed, moderate: Secondary | ICD-10-CM

## 2015-08-10 MED ORDER — CHLORHEXIDINE GLUCONATE CLOTH 2 % EX PADS
6.0000 | MEDICATED_PAD | Freq: Every day | CUTANEOUS | Status: DC
  Administered 2015-08-11: 6 via TOPICAL

## 2015-08-10 MED ORDER — PHENYTOIN SODIUM EXTENDED 100 MG PO CAPS
100.0000 mg | ORAL_CAPSULE | Freq: Two times a day (BID) | ORAL | Status: DC
  Administered 2015-08-10 – 2015-08-11 (×3): 100 mg via ORAL
  Filled 2015-08-10 (×3): qty 1

## 2015-08-10 MED ORDER — MUPIROCIN 2 % EX OINT
1.0000 "application " | TOPICAL_OINTMENT | Freq: Two times a day (BID) | CUTANEOUS | Status: DC
  Administered 2015-08-10 – 2015-08-11 (×2): 1 via NASAL
  Filled 2015-08-10: qty 22

## 2015-08-10 NOTE — Clinical Social Work Psych Note (Signed)
Patient currently being reviewed by [facility] for [facility].  Vickii PennaGina Erielle Gawronski, LCSW [phone]  [facility] Licensed Clinical Social Worker

## 2015-08-10 NOTE — Consult Note (Signed)
Paula Sherbert EdD 

## 2015-08-10 NOTE — Progress Notes (Signed)
Was called to patient's room because patient expressed frustration about the care she had been receiving at the hospital and that she wanted a different sitter.  States "I have been told that I can take a shower and walk, then I am being told I can't take a shower or walk.  I was told that all of my tests were negative.  Now, I am being told that I have MRSA.  I feel like people are not doing their job.  I want to talk to my doctor and I want to be discharged."  Attempted to de-escalate the situation and informed patient that this nurse will locate the correct resources to give her the correct information.  Patient appeared more relaxed.  [facility] and [facility] was exchanged.  Discussed concern with Dr. Gwenlyn PerkingMadera, who reiterated that psych has to clear patient for her to be discharged.  Located the hospital policy and procedure regarding contact precautions and informed pt that it is okay to ambulate in the hallway as long as the patient has a clean gown and clean hands and can not touch the nourishment area.  Informed pt that she will need to be seen by psych before she can be discharged.  Pt verbalized understanding.  Still requested to speak to psychiatrist. Will continue to monitor.
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
## 2015-08-11 ENCOUNTER — Encounter (HOSPITAL_COMMUNITY): Payer: Self-pay | Admitting: *Deleted

## 2015-08-11 ENCOUNTER — Inpatient Hospital Stay (HOSPITAL_COMMUNITY)
Admission: AD | Admit: 2015-08-11 | Discharge: 2015-08-15 | DRG: 885 | Disposition: A | Discharge status: Home / Self Care | Payer: Medicare Other | Source: Intra-hospital | Attending: Psychiatry | Admitting: Psychiatry

## 2015-08-11 DIAGNOSIS — F17210 Nicotine dependence, cigarettes, uncomplicated: Secondary | ICD-10-CM | POA: Diagnosis present

## 2015-08-11 DIAGNOSIS — F3162 Bipolar disorder, current episode mixed, moderate: Secondary | ICD-10-CM | POA: Diagnosis not present

## 2015-08-11 DIAGNOSIS — F329 Major depressive disorder, single episode, unspecified: Secondary | ICD-10-CM | POA: Diagnosis present

## 2015-08-11 DIAGNOSIS — T50902D Poisoning by unspecified drugs, medicaments and biological substances, intentional self-harm, subsequent encounter: Secondary | ICD-10-CM | POA: Diagnosis not present

## 2015-08-11 DIAGNOSIS — F332 Major depressive disorder, recurrent severe without psychotic features: Secondary | ICD-10-CM | POA: Insufficient documentation

## 2015-08-11 DIAGNOSIS — F3130 Bipolar disorder, current episode depressed, mild or moderate severity, unspecified: Secondary | ICD-10-CM

## 2015-08-11 DIAGNOSIS — G40909 Epilepsy, unspecified, not intractable, without status epilepticus: Secondary | ICD-10-CM | POA: Insufficient documentation

## 2015-08-11 DIAGNOSIS — G40409 Other generalized epilepsy and epileptic syndromes, not intractable, without status epilepticus: Secondary | ICD-10-CM | POA: Diagnosis present

## 2015-08-11 DIAGNOSIS — T420X2A Poisoning by hydantoin derivatives, intentional self-harm, initial encounter: Secondary | ICD-10-CM | POA: Diagnosis not present

## 2015-08-11 DIAGNOSIS — G4700 Insomnia, unspecified: Secondary | ICD-10-CM | POA: Diagnosis present

## 2015-08-11 DIAGNOSIS — R45851 Suicidal ideations: Secondary | ICD-10-CM | POA: Diagnosis not present

## 2015-08-11 DIAGNOSIS — T1491 Suicide attempt: Secondary | ICD-10-CM | POA: Diagnosis not present

## 2015-08-11 HISTORY — DX: Unspecified convulsions: R56.9

## 2015-08-11 LAB — BASIC METABOLIC PANEL
ANION GAP: 8 (ref 5–15)
BUN: 9 mg/dL (ref 6–20)
CALCIUM: 9.3 mg/dL (ref 8.9–10.3)
CO2: 25 mmol/L (ref 22–32)
CREATININE: 0.67 mg/dL (ref 0.44–1.00)
Chloride: 105 mmol/L (ref 101–111)
GFR calc Af Amer: >60 mL/min (ref >60)
GFR calc non Af Amer: >60 mL/min (ref >60)
GLUCOSE: 90 mg/dL (ref 65–99)
Potassium: 4.4 mmol/L (ref 3.5–5.1)
Sodium: 138 mmol/L (ref 135–145)

## 2015-08-11 LAB — CBC
HEMATOCRIT: 39.8 % (ref 36.0–46.0)
Hemoglobin: 13.3 g/dL (ref 12.0–15.0)
MCH: 30.6 pg (ref 26.0–34.0)
MCHC: 33.4 g/dL (ref 30.0–36.0)
MCV: 91.7 fL (ref 78.0–100.0)
PLATELETS: 207 10*3/uL (ref 150–400)
RBC: 4.34 MIL/uL (ref 3.87–5.11)
RDW: 13.3 % (ref 11.5–15.5)
WBC: 5.7 10*3/uL (ref 4.0–10.5)

## 2015-08-11 MED ORDER — ZOLPIDEM TARTRATE 5 MG PO TABS
5.0000 mg | ORAL_TABLET | Freq: Every evening | ORAL | Status: DC | PRN
  Administered 2015-08-11 – 2015-08-13 (×3): 5 mg via ORAL
  Filled 2015-08-11 (×3): qty 1

## 2015-08-11 MED ORDER — PHENYTOIN SODIUM EXTENDED 100 MG PO CAPS
100.0000 mg | ORAL_CAPSULE | Freq: Two times a day (BID) | ORAL | Status: DC
  Administered 2015-08-11 – 2015-08-15 (×8): 100 mg via ORAL
  Filled 2015-08-11 (×12): qty 1

## 2015-08-11 MED ORDER — MAGNESIUM HYDROXIDE 400 MG/5ML PO SUSP: 30.0000 mL | Freq: Every day | ORAL | Status: DC | PRN

## 2015-08-11 MED ORDER — ZOLPIDEM TARTRATE 10 MG PO TABS: 10.0000 mg | ORAL_TABLET | Freq: Every evening | ORAL | Status: DC | PRN

## 2015-08-11 MED ORDER — ACETAMINOPHEN 325 MG PO TABS
650.0000 mg | ORAL_TABLET | Freq: Four times a day (QID) | ORAL | Status: DC | PRN
  Administered 2015-08-12: 650 mg via ORAL
  Filled 2015-08-11: qty 2

## 2015-08-11 MED ORDER — PNEUMOCOCCAL VAC POLYVALENT 25 MCG/0.5ML IJ INJ
0.5000 mL | INJECTION | INTRAMUSCULAR | Status: AC
  Administered 2015-08-12: 0.5 mL via INTRAMUSCULAR

## 2015-08-11 MED ORDER — TRAZODONE HCL 50 MG PO TABS: 50.0000 mg | ORAL_TABLET | Freq: Every evening | ORAL | Status: DC | PRN

## 2015-08-11 NOTE — Clinical Social Work Psych Note (Signed)
[facility]  Room assignment: 403 Bed 2 Admitting MD: Dr. Jama Flavorsobos RN report number: [phone] Transportation: [facility] [phone])  Marcelline DeistEmily Tameah Mihalko, LCSW [phone] Orthopedics: [phone] Surgical: [phone]
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
## 2015-08-11 NOTE — Progress Notes (Signed)
Averleigh Sudie BaileyMarie Morison to be D/C'd [facility] per MD order.  Discussed with the patient and all questions fully answered.  VSS, Skin clean, dry and intact without evidence of skin break down, no evidence of skin tears noted.   Report called to RN at Behavior [facility]     Patient escorted via [place]  Pura SpiceJessica K Edwards 08/11/2015 3:27 PM

## 2015-08-11 NOTE — Progress Notes (Signed)
Patient's medications are located in the pharmacy and her cell phone is located with secrity.  I talked with the patient and she is ok for those things [facility] and she will come back here to [facility] to pick the medication and cell phone.

## 2015-08-11 NOTE — Progress Notes (Signed)
Patient requested to be revaluated by a psychiatrist for possible evaluation for outpatient therapy.  Paged Dr. Gwenlyn PerkingMadera to address the patients request.
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
## 2015-08-11 NOTE — Progress Notes (Signed)
[facility]
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
## 2015-08-11 NOTE — Progress Notes (Signed)
Patient ID: Lamonte Richerawn Marie Lover, female   DOB: November 16, 1985, 29 y.o.   MRN: [id number]  PER STATE REGULATIONS 482.30  THIS CHART WAS REVIEWED FOR MEDICAL NECESSITY WITH RESPECT TO THE PATIENT'S ADMISSION/DURATION OF STAY.  NEXT REVIEW DATE:08/15/15  Loura HaltBARBARA Onesti Bonfiglio, RN, BSN CASE MANAGER

## 2015-08-11 NOTE — Progress Notes (Signed)
Patient attend karaoke group tonight.  

## 2015-08-12 ENCOUNTER — Encounter (HOSPITAL_COMMUNITY): Payer: Self-pay | Admitting: Psychiatry

## 2015-08-12 DIAGNOSIS — F3130 Bipolar disorder, current episode depressed, mild or moderate severity, unspecified: Secondary | ICD-10-CM | POA: Diagnosis not present

## 2015-08-12 MED ORDER — ARIPIPRAZOLE 5 MG PO TABS
5.0000 mg | ORAL_TABLET | Freq: Every day | ORAL | Status: DC
  Administered 2015-08-12 – 2015-08-15 (×4): 5 mg via ORAL
  Filled 2015-08-12 (×6): qty 1

## 2015-08-12 NOTE — Plan of Care (Signed)
Problem: Ineffective individual coping Goal: STG: Patient will remain free from self harm Outcome: Progressing Pt has not harmed herself tonight.  She denies SI, denies thoughts of self-harm.  Pt verbally contracts for safety.

## 2015-08-12 NOTE — Progress Notes (Signed)
D: Pt has anxious affect and mood.  Pt reports she is "okay, better than being at that other place."  Pt reports she is here because "I accidentally overdosed on my medications, I wasn't paying attention, it wasn't a suicide attempt."  Pt denies SI/HI, denies hallucinations, reports right foot pain of 6/10.  Pt has been visible in milieu interacting with peers and staff appropriately.  Pt attended evening group.   A: Introduced self to pt.  Met with pt 1:1 and provided support and encouragement.  Actively listened to pt.  Medications administered per order.  PRN medication administered for sleep.  PRN medication offered for pain, pt refused.   R: Pt is compliant with scheduled medication.  Pt verbally contracts for safety.  Will continue to monitor and assess.
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
## 2015-08-12 NOTE — [facility] Group Notes (Signed)
[facility] LCSW Group Therapy 08/12/2015 1:15 PM Type of Therapy: Group Therapy Participation Level: Active  Participation Quality: Attentive, Sharing and Supportive  Affect: Hyperverbal  Cognitive: Alert and Oriented  Insight: Developing/Improving and Engaged  Engagement in Therapy: Developing/Improving and Engaged  Modes of Intervention: Clarification, Confrontation, Discussion, Education, Exploration, Limit-setting, Orientation, Problem-solving, Rapport Building, Dance movement psychotherapisteality Testing, Socialization and Support  Summary of Progress/Problems: The topic for today was feelings about relapse. Pt discussed what relapse prevention is to them and identified triggers that they are on the path to relapse. Pt processed their feeling towards relapse and was able to relate to peers. Pt discussed coping skills that can be used for relapse prevention. Patient reports that she does not feel that she needs to be admitted as she accidentally overdosed without intentions to harm herself. She did participate in discussion, sharing about various coping skills that are helpful to her such as listening to music. CSW and other group members provided patient with emotional support and encouragement.   Samuella BruinKristin Lanay Zinda, MSW, [employer] Inc[employer]CSWA Clinical Social Worker [facility] [phone]

## 2015-08-12 NOTE — Progress Notes (Signed)
Adult Psychoeducational Group Note  Date:  08/12/2015 Time:  9:31 PM  Group Topic/Focus:  Wrap-Up Group:   The focus of this group is to help patients review their daily goal of treatment and discuss progress on daily workbooks.  Participation Level:  Did Not Attend  Participation Quality:  Did not attend  Affect:  Did not attend  Cognitive:  Did not attend  Insight: None  Engagement in Group:  Did not attend  Modes of Intervention:  Did not attend  Additional Comments:  Patient did not attend wrap up group tonight.  Zolton Dowson L Carrina Schoenberger 08/12/2015, 9:31 PM

## 2015-08-12 NOTE — [facility] Group Notes (Deleted)
[facility] LCSW Group Therapy 08/12/2015 1:15pm  Type of Therapy: Group Therapy- Feelings Around Relapse and Recovery  Pt did not attend, declined invitation.   Chad CordialLauren Carter, Theresia MajorsLCSWA [phone] 08/12/2015 3:14 PM
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
## 2015-08-12 NOTE — Plan of Care (Signed)
Problem: Diagnosis: Increased Risk For Suicide Attempt Goal: STG-Patient Will Attend All Groups On The Unit Outcome: Progressing Pt attended evening group on 08/11/15

## 2015-08-12 NOTE — Progress Notes (Signed)
Patient ID: Paula Little, female   DOB: 10/31/1985, 29 y.o.   MRN: [id number]  PER STATE REGULATIONS 482.30  THIS CHART WAS REVIEWED FOR MEDICAL NECESSITY WITH RESPECT TO THE PATIENT'S ADMISSION/DURATION OF STAY.  NEXT REVIEW DATE:08/15/15  Elysa Womac, RN, BSN CASE MANAGER  

## 2015-08-12 NOTE — Tx Team (Signed)
Interdisciplinary Treatment Plan Update (Adult) Date: 08/12/2015   Date: 08/12/2015 1:08 PM  Progress in Treatment:  Attending groups: Pt is new to milieu, continuing to assess  Participating in groups: Pt is new to milieu, continuing to assess  Taking medication as prescribed: Yes  Tolerating medication: Yes  Family/Significant othe contact made: No, CSW assessing for appropriate contact Patient understands diagnosis: No, Pt is minimizing symptoms and overdose prior to admission Discussing patient identified problems/goals with staff: Yes  Medical problems stabilized or resolved: Yes  Denies suicidal/homicidal ideation: Yes Patient has not harmed self or Others: Yes   New problem(s) identified: None identified at this time.   Discharge Plan or Barriers: Pt will return home and follow-up with [facility] in [facility].  Additional comments:  Patient and CSW reviewed pt's identified goals and treatment plan. Patient verbalized understanding and agreed to treatment plan. CSW reviewed [facility] "Discharge Process and Patient Involvement" Form. Pt verbalized understanding of information provided and signed form.   Reason for Continuation of Hospitalization:  Depression Medication stabilization  Estimated length of stay: 3-5 days  Review of initial/current patient goals per problem list:   1.  Goal(s): Patient will participate in aftercare plan  Met:  Yes  Target date: 3-5 days from date of admission   As evidenced by: Patient will participate within aftercare plan AEB aftercare provider and housing plan at discharge being identified.   08/12/15: Pt will return home and follow-up with [facility] in [facility].  2.  Goal (s): Patient will exhibit decreased depressive symptoms and suicidal ideations.  Met:  Progressing  Target date: 3-5 days from date of admission   As evidenced by: Patient will utilize self rating of depression at 3 or below and demonstrate decreased signs of  depression or be deemed stable for discharge by MD.  08/12/15: Pt denies depression and reports that she is not suicidal, however she was admitted after an overdose. Pt continues to claim this as "an accident"  Attendees:  Patient:    Family:    Physician: Dr. Parke Poisson, MD  08/12/2015 1:08 PM  Nursing: Lars Pinks, RN Case manager  08/12/2015 1:08 PM  Clinical Social Worker Peri Maris, [place] 08/12/2015 1:08 PM  Other: Tilden Fossa, LCSWA 08/12/2015 1:08 PM  Clinical: Marcella Dubs, RN 08/12/2015 1:08 PM  Other: , RN Charge Nurse 08/12/2015 1:08 PM  Other: Hilda Lias, [place], [place] Social Work [phone]
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
## 2015-08-12 NOTE — [facility] Group Notes (Signed)
[facility] Dis[facility] Planning [facility] Note  08/12/2015 8:45 AM  Pt did not attend, declined invitation.   Chad CordialLauren Carter, LCSWA 08/12/2015 9:22 AM
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
## 2015-08-12 NOTE — Progress Notes (Signed)
D: Pt has appropriate affect and anxious mood.  She reports her goal is to "have a good nights sleep."  Pt reports she may be discharging Monday "because I signed the 72 hour paper thing."  She reports she feels safe to discharge Monday.  She insists that she does not need to be at [facility] because her overdose was unintentional per pt.  Pt denies SI/HI, denies hallucinations, denies pain.  Pt has been visible in milieu interacting with peers and staff appropriately.  Pt did not attend evening group.   A: Met with pt 1:1 and provided support and encouragement.  Actively listened to pt.  Initial treatment plan completed with pt.  PRN medication administered for sleep. R: Pt is compliant with medications.  Pt verbally contracts for safety.  Will continue to monitor and assess.

## 2015-08-13 DIAGNOSIS — F329 Major depressive disorder, single episode, unspecified: Secondary | ICD-10-CM

## 2015-08-13 LAB — LIPID PANEL
CHOL/HDL RATIO: 3.5 ratio
Cholesterol: 247 mg/dL — ABNORMAL HIGH (ref 0–200)
HDL: 70 mg/dL (ref >40)
LDL Cholesterol: 150 mg/dL — ABNORMAL HIGH (ref 0–99)
Triglycerides: 135 mg/dL (ref <150)
VLDL: 27 mg/dL (ref 0–40)

## 2015-08-13 LAB — PHENYTOIN LEVEL, TOTAL: PHENYTOIN LVL: 2.8 ug/mL — AB (ref 10.0–20.0)

## 2015-08-13 NOTE — Progress Notes (Signed)
Adult Psychoeducational Group Note  Date:  08/13/2015 Time:  10:04 PM  Group Topic/Focus:  Wrap-Up Group:   The focus of this group is to help patients review their daily goal of treatment and discuss progress on daily workbooks.  Participation Level:  Active  Participation Quality:  Appropriate and Attentive  Affect:  Appropriate  Cognitive:  Alert and Appropriate  Insight: Appropriate and Good  Engagement in Group:  Engaged  Modes of Intervention:  Discussion  Additional Comments:  Patient reported having a good day.  Patient confirmed wanting to continue to work on her goals.    Paula Little, Paula Little 08/13/2015, 10:04 PM

## 2015-08-13 NOTE — Progress Notes (Signed)
Patient ID: Paula Little, female   DOB: [birth year], 29 y.o.   MRN: [id number]   Pt currently presents with a flat affect and irritable behavior. Per self inventory, pt rates depression at a 7, hopelessness 10 and anxiety 5. Pt's daily goal is to "focus on after treatment" and they intend to do so by "try not to let people in my way." Pt reports fair sleep, a good appetite, normal energy and "okay" concentration. Pt reports "hard time going to sleep" last night.   Pt provided with medications per providers orders. Pt's labs and vitals were monitored throughout the day. Pt supported emotionally and encouraged to express concerns and questions. Pt educated on medications.  Pt's safety ensured with 15 minute and environmental checks. Pt currently denies SI/HI and A/V hallucinations. Pt verbally agrees to seek staff if SI/HI or A/VH occurs and to consult with staff before acting on these thoughts. Will continue POC.

## 2015-08-13 NOTE — Progress Notes (Signed)
Patient ID: Lamonte Richerawn Marie Winberry, female   DOB: Jan 26, 1986, 29 y.o.   MRN: [id number]  Adult Psychoeducational Group Note  Date:  08/13/2015 Time: 09:40am  Group Topic/Focus:  Healthy Communication:   The focus of this group is to discuss communication, barriers to communication, as well as healthy ways to communicate with others.  Participation Level:  Did Not Attend  Participation Quality: n/a  Affect: n/a  Cognitive: n/a  Insight: n/a  Engagement in Group: n/a  Modes of Intervention:  Clarification, Discussion, Education, Orientation and Support  Additional Comments:  Pt was in bed asleep.   Aurora Maskwyman, Elani Delph E 08/13/2015, 11:33 AM

## 2015-08-13 NOTE — Progress Notes (Signed)
[facility]  08/13/2015 5:33 PM Paula Little   MRN:  [id number] Paula Little   Subjective:  "I cant sleep. I have been thinking about the pasting of my bother who was killed in auto accident"  Objective: Paula Little is awake, alert and oriented X4 , found interacting with peers   Denies suicidal or homicidal ideation. Denies auditory or visual hallucination and does not appear to be responding to internal stimuli. Patient interacts well with staff and others. Patient reports she is medication compliant without mediation side effects. Report learning new coping skills to communicate and pay attention to her day to day task. States "I cant sleep I have been thinking about the pasting of my bother who was killed in auto accident." Patient is asking to increase her Ambien and something for Anxiety.  States her depression 5/10. Reports good appetite other wise and resting okay . Support, encouragement and reassurance was provided.   Principal Problem: MDD (major depressive disorder) (HCC) Diagnosis:   Patient Active Problem List   Diagnosis Date Noted  . Bipolar I disorder, most recent episode depressed (HCC) [F31.30] 08/12/2015  . MDD (major depressive disorder) (HCC) [F32.9] 08/11/2015  . Seizure disorder (HCC) [G40.909]   . Bipolar 1 disorder, mixed, moderate (HCC) [F31.62]   . Intentional overdose of drug in tablet form (HCC) [T50.902A] 08/06/2015  . Overdose [T50.901A] 08/06/2015  . Suicidal intent [R45.851] 08/06/2015  . Cervicitis [N72] 03/31/2013  . Chronic constipation [K59.00] 11/12/2012  . Chronic lower back pain [M54.5, G89.29] 11/12/2012  . Hot flashes [N95.1] 11/12/2012  . Kidney stones [N20.0]   . Anemia [D64.9]    Total Time spent with patient: 30 minutes  Past Psychiatric History: Depression, Anxiety, Bipolar  Past Medical History:  Past Medical History  Diagnosis Date  . Kidney stones   . Anemia   . Chronic abdominal pain   . Seizures  [facility](HCC)     Past Surgical History  Procedure Laterality Date  . Cholecystectomy     Family History: History reviewed. No pertinent family history. Family Psychiatric  History: Mother Biplor, PSTD, Depression, Personality disorder  Social History:  History  Alcohol Use No     History  Drug Use No    Social History   Social History  . Marital Status: Married    Spouse Name: N/A  . Number of Children: N/A  . Years of Education: N/A   Social History Main Topics  . Smoking status: Current Every Day Smoker -- 1.00 packs/day    Types: Cigarettes  . Smokeless tobacco: None  . Alcohol Use: No  . Drug Use: No  . Sexual Activity: Yes   Other Topics Concern  . None   Social History Narrative   Additional Social History:                         Sleep: Fair  Appetite:  Good  Current Medications: Current Facility-Administered Medications  Medication Dose Route Frequency Provider Last Rate Last Dose  . acetaminophen (TYLENOL) tablet 650 mg  650 mg Oral Q6H PRN Adonis BrookSheila Agustin, NP   650 mg at 08/12/15 1408  . ARIPiprazole (ABILIFY) tablet 5 mg  5 mg Oral Daily Craige CottaFernando A Cobos, MD   5 mg at 08/13/15 0757  . magnesium hydroxide (MILK OF MAGNESIA) suspension 30 mL  30 mL Oral Daily PRN Adonis BrookSheila Agustin, NP      . phenytoin (DILANTIN) ER capsule 100 mg  100 mg Oral BID Adonis Brook, NP   100 mg at 08/13/15 1647  . zolpidem (AMBIEN) tablet 5 mg  5 mg Oral QHS PRN Dorethea Clan, RPH   5 mg at 08/12/15 2110    Lab Results:  Results for orders placed or performed during the hospital encounter of 08/11/15 (from the past 48 hour(s))  Lipid panel     Status: Abnormal   Collection Time: 08/13/15  6:43 AM  Result Value Ref Range   Cholesterol 247 (H) 0 - 200 mg/dL   Triglycerides 956 <213 mg/dL   HDL 70 >08 mg/dL   Total CHOL/HDL Ratio 3.5 RATIO   VLDL 27 0 - 40 mg/dL   LDL Cholesterol 657 (H) 0 - 99 mg/dL    Comment:        Total Cholesterol/HDL:CHD Risk Coronary Heart  Disease Risk Table                     Men   Women  1/2 Average Risk   3.4   3.3  Average Risk       5.0   4.4  2 X Average Risk   9.6   7.1  3 X Average Risk  23.4   11.0        Use the calculated Patient Ratio above and the CHD Risk Table to determine the patient's CHD Risk.        ATP III CLASSIFICATION (LDL):  <100     mg/dL   Optimal  846-962  mg/dL   Near or Above                    Optimal  130-159  mg/dL   Borderline  952-841  mg/dL   High  >324     mg/dL   Very High Performed at [facility]   Phenytoin level, total     Status: Abnormal   Collection Time: 08/13/15  6:43 AM  Result Value Ref Range   Phenytoin Lvl 2.8 (L) 10.0 - 20.0 ug/mL    Comment: Performed at [facility]    Physical Findings: AIMS: Facial and Oral Movements Muscles of Facial Expression: None, normal Lips and Perioral Area: None, normal Jaw: None, normal Tongue: None, normal,Extremity Movements Upper (arms, wrists, hands, fingers): None, normal Lower (legs, knees, ankles, toes): None, normal, Trunk Movements Neck, shoulders, hips: None, normal, Overall Severity Severity of abnormal movements (highest score from questions above): None, normal Incapacitation due to abnormal movements: None, normal Patient's awareness of abnormal movements (rate only patient's report): No Awareness, Dental Status Current problems with teeth and/or dentures?: Yes Does patient usually wear dentures?: No  CIWA:    COWS:     Musculoskeletal: Strength & Muscle Tone: within normal limits Gait & Station: normal Patient leans: N/A  Psychiatric Specialty Exam: Review of Systems  Constitutional: Negative.   HENT: Negative.   Eyes: Negative.   Respiratory: Negative.   Cardiovascular: Negative.   Gastrointestinal: Negative.   Genitourinary: Negative.   Musculoskeletal: Negative.   Skin: Negative.   Neurological: Negative.   Endo/Heme/Allergies: Negative.   Psychiatric/Behavioral:  Positive for depression and suicidal ideas. The patient is nervous/anxious and has insomnia.   All other systems reviewed and are negative.   Blood pressure 97/58, pulse 101, temperature 97.9 F (36.6 C), temperature source Oral, resp. rate 16, height 5' (1.524 m), weight 93.895 kg (207 lb), last menstrual period 07/17/2015.Body mass index is 40.43 kg/(m^2).  General  Appearance: Casual  Eye Contact::  Good  Speech:  Clear and Coherent and Pressured  Volume:  Normal  Mood:  Anxious and Depressed  Affect:  Depressed and Flat  Thought Process:  Coherent, Loose and Tangential  Orientation:  Full (Time, Place, and Person)  Thought Content:  Hallucinations: Auditory Visual, states history not at this time  Suicidal Thoughts:  No  Homicidal Thoughts:  No  Memory:  Immediate;   Fair Recent;   Fair Remote;   Fair  Judgement:  Fair  Insight:  Lacking  Psychomotor Activity:  Restlessness  Concentration:  Poor  Recall:  [employer] of Knowledge:Poor  Language: Fair  Akathisia:  No  Handed:  Right  AIMS (if indicated):     Assets:  Community education officer  ADL's:  Intact  Cognition: WNL  Sleep:  Number of Hours: 6.75     I agree with current treatment plan on 08/13/2015, Patient seen face-to-face for psychiatric evaluation follow-up, chart reviewed.  Treatment Plan Summary: Daily contact with patient to assess and evaluate symptoms and progress in treatment and Medication management   Patient prefers a medication not clearly associated with weight gain. She is agreeing to Abilify trial- for depression and mood disorder- we discussed side effects and rationale.  Contiun Abilify 5 mgrs QDAY for mood disorder  She remains on Dilantin for history of seizure disorder, states this medication is helpful and well tolerated .   Oneta Rack FNP [facility] 08/13/2015, 5:33 PM
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
## 2015-08-14 DIAGNOSIS — F332 Major depressive disorder, recurrent severe without psychotic features: Secondary | ICD-10-CM | POA: Insufficient documentation

## 2015-08-14 MED ORDER — ZOLPIDEM TARTRATE 10 MG PO TABS
10.0000 mg | ORAL_TABLET | Freq: Once | ORAL | Status: AC
  Administered 2015-08-14: 10 mg via ORAL
  Filled 2015-08-14: qty 1

## 2015-08-14 MED ORDER — PANTOPRAZOLE SODIUM 40 MG PO TBEC
40.0000 mg | DELAYED_RELEASE_TABLET | Freq: Once | ORAL | Status: AC
  Administered 2015-08-14: 40 mg via ORAL
  Filled 2015-08-14 (×2): qty 1

## 2015-08-14 NOTE — [facility] Group Notes (Signed)
[facility] Group Notes:  (Nursing/MHT/Case Management/Adjunct)  Date:  08/14/2015  Time:  2:24 PM  Type of Therapy:  Psychoeducational Skills  Participation Level:  Active  Participation Quality:  Intrusive, Inattentive and Redirectable  Affect:  Depressed  Cognitive:  Lacking  Insight:  Limited  Engagement in Group:  Distracting and Monopolizing  Modes of Intervention:  Discussion and Education  Summary of Progress/Problems: The purpose of this group is to discuss healthy support systems. Patient's also were able to focus on perspectives and think about their irrational thoughts and asked to name one. Patient first started group out with her back turned to Clinical research associatewriter and the other RN leading the group facing her female peer. She would randomly interject when she agreed with a point and say things like, "that sounds just like my mama." She was somewhat intrusive but redirectable. When asked about her irrational thought she first stated she needed someone and had fear of abandonment. Shortly after that she spoke about her independence and that she has always had to rely on herself.  Marzetta BoardDopson, [place] Alfredo E 08/14/2015, 2:24 PM

## 2015-08-14 NOTE — Progress Notes (Signed)
Adult Psychoeducational Group Note  Date:  08/14/2015 Time:  9:38 PM  Group Topic/Focus:  Wrap-Up Group:   The focus of this group is to help patients review their daily goal of treatment and discuss progress on daily workbooks.  Participation Level:  Active  Participation Quality:  Appropriate  Affect:  Appropriate  Cognitive:  Alert  Insight: Appropriate  Engagement in Group:  Engaged  Modes of Intervention:  Discussion  Additional Comments:  Patient stated she had an fantastic day. Patient stated her goal for today was to focus on discharge. Patient stated she met her goal.  Paula Little L Paula Little 08/14/2015, 9:38 PM
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
## 2015-08-14 NOTE — Progress Notes (Addendum)
D: Pt has depressed affect and mood.  She reports her day has been "all right."  Pt reports her goal today was to "try to stay focused and not let anything get in my way."  Pt denies SI/HI, denies hallucinations, denies pain.  Pt has been visible in milieu interacting with peers and staff appropriately.  Pt attended evening group.   A: Met with pt 1:1 and provided support and encouragement.  Actively listened to pt. PRN medication administered for sleep. R: Pt is compliant with medications.  Pt verbally contracts for safety.  Will continue to monitor and assess.

## 2015-08-14 NOTE — Progress Notes (Signed)
[facility] MD Progress Note  08/14/2015 9:09 AM Baylynn Tashanda Fuhrer   MRN:  [id number] Terre Sudie Bailey   Subjective:  "I cant sleep. I usual take 10 mg of Ambien "  Objective: Tamee Money Mckeithan is awake, alert and oriented X4 , found interacting with peers   Denies suicidal or homicidal ideation. Denies auditory or visual hallucination and does not appear to be responding to internal stimuli. Patient interacts well with staff and others. Patient reports she is medication compliant without mediation side effects. Report she is not learning any new coping skills, " Lavenia Atlas been in places like this my whole life there's nothing new that I can learn. States "I cant sleep, I usually take 10 mg of Ambien this  is not working.  States her depression 5/10. Reports good appetite other wise and resting okay . Support, encouragement and reassurance was provided.   Principal Problem: MDD (major depressive disorder) (HCC) Diagnosis:   Patient Active Problem List   Diagnosis Date Noted  . Severe episode of recurrent major depressive disorder, without psychotic features (HCC) [F33.2]   . Bipolar I disorder, most recent episode depressed (HCC) [F31.30] 08/12/2015  . MDD (major depressive disorder) (HCC) [F32.9] 08/11/2015  . Seizure disorder (HCC) [G40.909]   . Bipolar 1 disorder, mixed, moderate (HCC) [F31.62]   . Intentional overdose of drug in tablet form (HCC) [T50.902A] 08/06/2015  . Overdose [T50.901A] 08/06/2015  . Suicidal intent [R45.851] 08/06/2015  . Cervicitis [N72] 03/31/2013  . Chronic constipation [K59.00] 11/12/2012  . Chronic lower back pain [M54.5, G89.29] 11/12/2012  . Hot flashes [N95.1] 11/12/2012  . Kidney stones [N20.0]   . Anemia [D64.9]    Total Time spent with patient: 30 minutes  Past Psychiatric History: Depression, Anxiety, Bipolar  Past Medical History:  Past Medical History  Diagnosis Date  . Kidney stones   . Anemia   . Chronic abdominal pain   . Seizures [facility])      Past Surgical History  Procedure Laterality Date  . Cholecystectomy     Family History: History reviewed. No pertinent family history. Family Psychiatric  History: Mother Bipolar, PTSD, Depression, Personality disorder  Social History:  History  Alcohol Use No     History  Drug Use No    Social History   Social History  . Marital Status: Married    Spouse Name: N/A  . Number of Children: N/A  . Years of Education: N/A   Social History Main Topics  . Smoking status: Current Every Day Smoker -- 1.00 packs/day    Types: Cigarettes  . Smokeless tobacco: None  . Alcohol Use: No  . Drug Use: No  . Sexual Activity: Yes   Other Topics Concern  . None   Social History Narrative   Additional Social History:                         Sleep: Fair reports she is having a difficult time falling asleep  Appetite:  Good  Current Medications: Current Facility-Administered Medications  Medication Dose Route Frequency Provider Last Rate Last Dose  . acetaminophen (TYLENOL) tablet 650 mg  650 mg Oral Q6H PRN Adonis Brook, NP   650 mg at 08/12/15 1408  . ARIPiprazole (ABILIFY) tablet 5 mg  5 mg Oral Daily Craige Cotta, MD   5 mg at 08/14/15 0814  . magnesium hydroxide (MILK OF MAGNESIA) suspension 30 mL  30 mL Oral Daily PRN Adonis Brook, NP      .  phenytoin (DILANTIN) ER capsule 100 mg  100 mg Oral BID Adonis BrookSheila Agustin, NP   100 mg at 08/14/15 [id number]  . zolpidem (AMBIEN) tablet 5 mg  5 mg Oral QHS PRN Dorethea ClanMary Ann Frens, RPH   5 mg at 08/13/15 2102    Lab Results:  Results for orders placed or performed during the hospital encounter of 08/11/15 (from the past 48 hour(s))  Lipid panel     Status: Abnormal   Collection Time: 08/13/15  6:43 AM  Result Value Ref Range   Cholesterol 247 (H) 0 - 200 mg/dL   Triglycerides 960135 <454<150 mg/dL   HDL 70 >09>40 mg/dL   Total CHOL/HDL Ratio 3.5 RATIO   VLDL 27 0 - 40 mg/dL   LDL Cholesterol 811150 (H) 0 - 99 mg/dL    Comment:         Total Cholesterol/HDL:CHD Risk Coronary Heart Disease Risk Table                     Men   Women  1/2 Average Risk   3.4   3.3  Average Risk       5.0   4.4  2 X Average Risk   9.6   7.1  3 X Average Risk  23.4   11.0        Use the calculated Patient Ratio above and the CHD Risk Table to determine the patient's CHD Risk.        ATP III CLASSIFICATION (LDL):  <100     mg/dL   Optimal  [id number]  mg/dL   Near or Above                    Optimal  130-159  mg/dL   Borderline  [id number]  mg/dL   High  >086>190     mg/dL   Very High [facility]   Phenytoin level, total     Status: Abnormal   Collection Time: 08/13/15  6:43 AM  Result Value Ref Range   Phenytoin Lvl 2.8 (L) 10.0 - 20.0 ug/mL    Comment: Performed at [facility]    Physical Findings: AIMS: Facial and Oral Movements Muscles of Facial Expression: None, normal Lips and Perioral Area: None, normal Jaw: None, normal Tongue: None, normal,Extremity Movements Upper (arms, wrists, hands, fingers): None, normal Lower (legs, knees, ankles, toes): None, normal, Trunk Movements Neck, shoulders, hips: None, normal, Overall Severity Severity of abnormal movements (highest score from questions above): None, normal Incapacitation due to abnormal movements: None, normal Patient's awareness of abnormal movements (rate only patient's report): No Awareness, Dental Status Current problems with teeth and/or dentures?: Yes Does patient usually wear dentures?: No  CIWA:    COWS:     Musculoskeletal: Strength & Muscle Tone: within normal limits Gait & Station: normal Patient leans: N/A  Psychiatric Specialty Exam: Review of Systems  Constitutional: Negative.   HENT: Negative.   Eyes: Negative.   Respiratory: Negative.   Cardiovascular: Negative.   Gastrointestinal: Negative.   Genitourinary: Negative.   Musculoskeletal: Negative.   Skin: Negative.   Neurological: Negative.    Endo/Heme/Allergies: Negative.   Psychiatric/Behavioral: Positive for depression. The patient is nervous/anxious and has insomnia.   All other systems reviewed and are negative.   Blood pressure 104/92, pulse 104, temperature 98.5 F (36.9 C), temperature source Oral, resp. rate 16, height 5' (1.524 m), weight 93.895 kg (207 lb), last menstrual period 07/17/2015.Body mass index is  40.43 kg/(m^2).  General Appearance: Casual  Eye Contact::  Good  Speech:  Clear and Coherent  Volume:  Normal  Mood:  Depressed and Irritable  Affect:  Flat  Thought Process:  Coherent and Loose  Orientation:  Full (Time, Place, and Person)  Thought Content:  Hallucinations: Auditory Visual, states history not at this time  Suicidal Thoughts:  No  Homicidal Thoughts:  No  Memory:  Immediate;   Fair Recent;   Fair Remote;   Fair  Judgement:  Fair  Insight:  Lacking  Psychomotor Activity:  Restlessness  Concentration:  Poor  Recall:  [employer] of Knowledge:Poor  Language: Fair  Akathisia:  No  Handed:  Right  AIMS (if indicated):     Assets:  Community education officer  ADL's:  Intact  Cognition: WNL  Sleep:  Number of Hours: 5.5     I agree with current treatment plan on 08/14/2015, Patient seen face-to-face for psychiatric evaluation follow-up, chart reviewed.  Treatment Plan Summary: Daily contact with patient to assess and evaluate symptoms and progress in treatment and Medication management  Patient prefers a medication not clearly associated with weight gain. She is agreeing to Abilify trial- for depression and mood disorder- we discussed side effects and rationale.  Calling to verify Ambien doses. [facility]) [phone] 06/20/2015 X1 Ambien 10 mgs Continue Abilify 5 mg QDAY for mood disorder  She remains on Dilantin for history of seizure disorder, states this medication is helpful and well tolerated .   Oneta Rack FNP- BC 08/14/2015, 9:09 AM

## 2015-08-14 NOTE — Progress Notes (Signed)
D. Pt has been up and visible in milieu this evening, did attend and participate in group activity, pt was seen interacting appropriately in the milieu and spoke about how her request for discharge is up in the morning and is hopeful for discharge. Pt spoke about how she is feeling good and denied any SI. Pt did receive bedtime medication without incident. A. Support and encouragement provided. R. Safety maintained, will continue to monitor.

## 2015-08-15 DIAGNOSIS — F332 Major depressive disorder, recurrent severe without psychotic features: Secondary | ICD-10-CM

## 2015-08-15 LAB — HEMOGLOBIN A1C
HEMOGLOBIN A1C: 5.3 % (ref 4.8–5.6)
MEAN PLASMA GLUCOSE: 105 mg/dL

## 2015-08-15 MED ORDER — PHENYTOIN SODIUM EXTENDED 100 MG PO CAPS
100.0000 mg | ORAL_CAPSULE | Freq: Three times a day (TID) | ORAL | Status: DC
  Filled 2015-08-15 (×3): qty 1

## 2015-08-15 MED ORDER — ACETAMINOPHEN 325 MG PO TABS: 650.0000 mg | ORAL_TABLET | Freq: Four times a day (QID) | ORAL | Status: DC | PRN

## 2015-08-15 MED ORDER — ARIPIPRAZOLE 5 MG PO TABS: 5.0000 mg | ORAL_TABLET | Freq: Every day | ORAL | Status: DC

## 2015-08-15 MED ORDER — ZOLPIDEM TARTRATE 5 MG PO TABS: 5.0000 mg | ORAL_TABLET | Freq: Every evening | ORAL | Status: DC | PRN

## 2015-08-15 MED ORDER — PHENYTOIN SODIUM EXTENDED 100 MG PO CAPS: 100.0000 mg | ORAL_CAPSULE | Freq: Three times a day (TID) | ORAL | Status: DC

## 2015-08-15 NOTE — Progress Notes (Signed)
Discharge note: Pt received both written and verbal discharge instructions. Pt verbalized understanding of discharge instructions. Pt agreed to f/u appt and med regimen. Pt received prescriptions and belongings. [facility]

## 2015-08-15 NOTE — [facility] Suicide Risk Assessment (Signed)
[facility] INPATIENT:  Family/Significant Other Suicide Prevention Education  Suicide Prevention Education:  Education Completed; Paula PareKenneth Little, Pt's significant other [phone],  has been identified by the patient as the family member/significant other with whom the patient will be residing, and identified as the person(s) who will aid the patient in the event of a mental health crisis (suicidal ideations/suicide attempt).  With written consent from the patient, the family member/significant other has been provided the following suicide prevention education, prior to the and/or following the discharge of the patient.  The suicide prevention education provided includes the following:  Suicide risk factors  Suicide prevention and interventions  National Suicide Hotline telephone number  [facility] assessment telephone number  [facility] Emergency Assistance 911  [facility] and/or Residential Mobile Crisis Unit telephone number  Request made of family/significant other to:  Remove weapons (e.g., guns, rifles, knives), all items previously/currently identified as safety concern.    Remove drugs/medications (over-the-counter, prescriptions, illicit drugs), all items previously/currently identified as a safety concern.  The family member/significant other verbalizes understanding of the suicide prevention education information provided.  The family member/significant other agrees to remove the items of safety concern listed above.  Paula Little, Paula Little 08/15/2015, 10:29 AM

## 2015-08-15 NOTE — [facility] Suicide Risk Assessment (Signed)
[facility] Discharge Suicide Risk Assessment   Demographic Factors:  29 year old female , on disability.   Total Time spent with patient: 30 minutes  Musculoskeletal: Strength & Muscle Tone: within normal limits Gait & Station: normal Patient leans: N/A  Psychiatric Specialty Exam: Physical Exam  ROS  Blood pressure 106/66, pulse 94, temperature 98.1 F (36.7 C), temperature source Oral, resp. rate 16, height 5' (1.524 m), weight 207 lb (93.895 kg), last menstrual period 07/17/2015.Body mass index is 40.43 kg/(m^2).  General Appearance: improved grooming  Eye Contact::  Good  Speech:  Normal Rate409  Volume:  Normal  Mood:  improved, euthymic at this time, denies depression  Affect:  Appropriate  Thought Process:  Linear  Orientation:  Full (Time, Place, and Person)  Thought Content:  denies hallucinations, no delusions   Suicidal Thoughts:  No- denies any suicidal ideations,  Denies any self injurious ideations, no homicidal ideations  Homicidal Thoughts:  No  Memory:  recent and remote grossly intact   Judgement:   Improved   Insight:  improved   Psychomotor Activity:  Normal  Concentration:  Good  Recall:  Good  Fund of Knowledge:Good  Language: Good  Akathisia:  Negative  Handed:  Right  AIMS (if indicated):     Assets:  Communication Skills Desire for Improvement Resilience  Sleep:  Number of Hours: 6.25  Cognition: WNL  ADL's:  Intact   Have you used any form of tobacco in the last 30 days? (Cigarettes, Smokeless Tobacco, Cigars, and/or Pipes): Yes  Has this patient used any form of tobacco in the last 30 days? (Cigarettes, Smokeless Tobacco, Cigars, and/or Pipes) Yes, A prescription for an FDA-approved tobacco cessation medication was offered at discharge and the patient refused  Mental Status Per Nursing Assessment::   On Admission:  Suicidal ideation indicated by patient  Current Mental Status by Physician: At this time patient presents significantly improved  compared to admission-  She is alert, attentive, improved grooming, mood is improved, affect is fuller in range, no thought disorder, denies any suicidal ideations, no homicidal ideations,  No hallucinations, no delusions , future oriented .    Loss Factors: History of seizure disorder, on disability  Historical Factors: Depression, prior psychiatric admissions, states she has been diagnosed with Bipolar Disorder and with PTSD in the past   Risk Reduction Factors:   Sense of responsibility to family, Living with another person, especially a relative and Positive social support  Continued Clinical Symptoms:  As noted, patient currently improved compared to admission, and at this time presents euthymic, with full range of affect, no psychotic symptoms, no SI, no HI, future oriented . Of note, has had no seizures on unit . She has been on Dilantin ER for Seizure Disorder. She tolerates it well. Her serum level was low, so we agreed to titrate Dilantin dose to 100 mgrs TID.    Cognitive Features That Contribute To Risk:  No gross cognitive deficits noted upon discharge. Is alert , attentive, and oriented x 3   Suicide Risk:  Mild:  Suicidal ideation of limited frequency, intensity, duration, and specificity.  There are no identifiable plans, no associated intent, mild dysphoria and related symptoms, good self-control (both objective and subjective assessment), few other risk factors, and identifiable protective factors, including available and accessible social support.  Principal Problem: MDD (major depressive disorder) [facility]) Discharge Diagnoses:  Patient Active Problem List   Diagnosis Date Noted  . Severe episode of recurrent major depressive disorder, without psychotic  features (HCC) [F33.2]   . Bipolar I disorder, most recent episode depressed (HCC) [F31.30] 08/12/2015  . MDD (major depressive disorder) (HCC) [F32.9] 08/11/2015  . Seizure disorder (HCC) [G40.909]   . Bipolar 1  disorder, mixed, moderate (HCC) [F31.62]   . Intentional overdose of drug in tablet form (HCC) [T50.902A] 08/06/2015  . Overdose [T50.901A] 08/06/2015  . Suicidal intent [R45.851] 08/06/2015  . Cervicitis [N72] 03/31/2013  . Chronic constipation [K59.00] 11/12/2012  . Chronic lower back pain [M54.5, G89.29] 11/12/2012  . Hot flashes [N95.1] 11/12/2012  . Kidney stones [N20.0]   . Anemia [D64.9]     Follow-up Information    [facility].   Why:  at 2:30pm for your hospital discharge appointment.    Contact information:   [address], [address] [place], [place] [address] Phone: [phone] Fax: [phone]      Plan Of Care/Follow-up recommendations:  Activity:  as tolerated Diet:  Regular Tests:  NA Other:  See below  Is patient on multiple antipsychotic therapies at discharge:  No   Has Patient had three or more failed trials of antipsychotic monotherapy by history:  No  Recommended Plan for Multiple Antipsychotic Therapies: NA   Patient is leaving unit in good spirits. States family member will pick her up later today Plans to return home Plans to follow up as above and also has a PCP , Dr. Hansel FeinsteinGisst, who sees her for medical issues and Seizure Disorder management . Patient states she also is being referred to a Neurologist and has an upcoming appt. Patient does not drive .     Romello Hoehn 08/15/2015, 11:32 AM

## 2015-08-15 NOTE — Tx Team (Signed)
Interdisciplinary Treatment Plan Update (Adult) Date: 08/15/2015   Date: 08/15/2015 10:33 AM  Progress in Treatment:  Attending groups: Yes Participating in groups: Yes Taking medication as prescribed: Yes  Tolerating medication: Yes  Family/Significant othe contact made: Yes, with significant other Patient understands diagnosis: No, Pt is minimizing symptoms and overdose prior to admission Discussing patient identified problems/goals with staff: Yes  Medical problems stabilized or resolved: Yes  Denies suicidal/homicidal ideation: Yes Patient has not harmed self or Others: Yes   New problem(s) identified: None identified at this time.   Discharge Plan or Barriers: Pt will return home and follow-up with [facility] in [facility].  Additional comments:  Patient and CSW reviewed pt's identified goals and treatment plan. Patient verbalized understanding and agreed to treatment plan. CSW reviewed [facility] "Discharge Process and Patient Involvement" Form. Pt verbalized understanding of information provided and signed form.   Reason for Continuation of Hospitalization:  Depression Medication stabilization  Estimated length of stay: 0 days; Pt stable for DC  Review of initial/current patient goals per problem list:   1.  Goal(s): Patient will participate in aftercare plan  Met:  Yes  Target date: 3-5 days from date of admission   As evidenced by: Patient will participate within aftercare plan AEB aftercare provider and housing plan at discharge being identified.   08/12/15: Pt will return home and follow-up with [facility] in [facility].  2.  Goal (s): Patient will exhibit decreased depressive symptoms and suicidal ideations.  Met:  Yes  Target date: 3-5 days from date of admission   As evidenced by: Patient will utilize self rating of depression at 3 or below and demonstrate decreased signs of depression or be deemed stable for discharge by MD.  08/12/15: Pt denies depression and  reports that she is not suicidal, however she was admitted after an overdose. Pt continues to claim this as "an accident"  08/15/15: Pt rates depression at 0/10; denies SI.  Attendees:  Patient:    Family:    Physician: Dr. Parke Poisson, MD  08/15/2015 10:33 AM  Nursing: Lars Pinks, RN Case manager  08/15/2015 10:33 AM  Clinical Social Worker Peri Maris, [place] 08/15/2015 10:33 AM  Other: Tilden Fossa, [place] 08/15/2015 10:33 AM  Clinical: Darrol Angel, RN 08/15/2015 10:33 AM  Other: , RN Charge Nurse 08/15/2015 10:33 AM  Other:     Peri Maris, [place] Social Work [phone]
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
## 2015-08-15 NOTE — Progress Notes (Signed)
  [facility] :  Will you be returning to the same living situation after discharge:  Yes,  Pt returning home with significant other At discharge, do you have transportation home?: Yes,  boyfriend to provide transportation Do you have the ability to pay for your medications: Yes,  Pt provided with prescriptions  Release of information consent forms completed and in the chart;  Patient's signature needed at discharge.  Patient to Follow up at: Follow-up Information    [facility] On 08/17/2015.   Why:  at 2:30pm for your hospital discharge appointment.    Contact information:   [address], [address] [place], [place] [address] Phone: [phone] Fax: [phone]      Next level of care provider [facility] Link:no  Patient denies SI/HI: Yes,  Pt denies    Safety Planning and Suicide Prevention discussed: Yes,  with boyfriend; sese SPE note for further details  Have you used any form of tobacco in the last 30 days? (Cigarettes, Smokeless Tobacco, Cigars, and/or Pipes): Yes  Has patient been referred to the Quitline?: Patient refused referral  Elaina HoopsCarter, Nylee Barbuto M 08/15/2015, 10:39 AM

## 2015-08-15 NOTE — Progress Notes (Signed)
Assumed care of pt at 0300.  Pt is resting in bed with her eyes closed.  Respirations are even and unlabored.  Will continue to monitor and assess.

## 2015-09-21 ENCOUNTER — Encounter (HOSPITAL_COMMUNITY): Payer: Self-pay | Admitting: Emergency Medicine

## 2015-09-21 ENCOUNTER — Emergency Department (HOSPITAL_COMMUNITY)
Admission: EM | Admit: 2015-09-21 | Discharge: 2015-09-22 | Disposition: A | Discharge status: Other Acute Inpatient Hospital | Payer: Medicare Other | Attending: Emergency Medicine | Admitting: Emergency Medicine

## 2015-09-21 DIAGNOSIS — G8929 Other chronic pain: Secondary | ICD-10-CM | POA: Insufficient documentation

## 2015-09-21 DIAGNOSIS — Z862 Personal history of diseases of the blood and blood-forming organs and certain disorders involving the immune mechanism: Secondary | ICD-10-CM | POA: Insufficient documentation

## 2015-09-21 DIAGNOSIS — F329 Major depressive disorder, single episode, unspecified: Secondary | ICD-10-CM | POA: Diagnosis not present

## 2015-09-21 DIAGNOSIS — R45851 Suicidal ideations: Secondary | ICD-10-CM | POA: Diagnosis present

## 2015-09-21 DIAGNOSIS — F32A Depression, unspecified: Secondary | ICD-10-CM

## 2015-09-21 DIAGNOSIS — Z87442 Personal history of urinary calculi: Secondary | ICD-10-CM | POA: Diagnosis not present

## 2015-09-21 DIAGNOSIS — Z880 Allergy status to penicillin: Secondary | ICD-10-CM | POA: Insufficient documentation

## 2015-09-21 DIAGNOSIS — Z79899 Other long term (current) drug therapy: Secondary | ICD-10-CM | POA: Insufficient documentation

## 2015-09-21 DIAGNOSIS — Z91040 Latex allergy status: Secondary | ICD-10-CM | POA: Diagnosis not present

## 2015-09-21 DIAGNOSIS — T1491XA Suicide attempt, initial encounter: Secondary | ICD-10-CM

## 2015-09-21 DIAGNOSIS — F17210 Nicotine dependence, cigarettes, uncomplicated: Secondary | ICD-10-CM | POA: Insufficient documentation

## 2015-09-21 HISTORY — DX: Major depressive disorder, single episode, unspecified: F32.9

## 2015-09-21 HISTORY — DX: Depression, unspecified: F32.A

## 2015-09-21 LAB — RAPID URINE DRUG SCREEN, HOSP PERFORMED
AMPHETAMINES: NOT DETECTED
BENZODIAZEPINES: NOT DETECTED
Barbiturates: NOT DETECTED
Cocaine: NOT DETECTED
OPIATES: NOT DETECTED
TETRAHYDROCANNABINOL: NOT DETECTED

## 2015-09-21 LAB — CBC
HCT: 36.6 % (ref 36.0–46.0)
HEMOGLOBIN: 12.2 g/dL (ref 12.0–15.0)
MCH: 30.6 pg (ref 26.0–34.0)
MCHC: 33.3 g/dL (ref 30.0–36.0)
MCV: 91.7 fL (ref 78.0–100.0)
Platelets: 185 10*3/uL (ref 150–400)
RBC: 3.99 MIL/uL (ref 3.87–5.11)
RDW: 13 % (ref 11.5–15.5)
WBC: 5.4 10*3/uL (ref 4.0–10.5)

## 2015-09-21 LAB — SALICYLATE LEVEL

## 2015-09-21 LAB — COMPREHENSIVE METABOLIC PANEL
ALT: 26 U/L (ref 14–54)
AST: 25 U/L (ref 15–41)
Albumin: 3.7 g/dL (ref 3.5–5.0)
Alkaline Phosphatase: 96 U/L (ref 38–126)
Anion gap: 9 (ref 5–15)
BUN: 7 mg/dL (ref 6–20)
CHLORIDE: 108 mmol/L (ref 101–111)
CO2: 21 mmol/L — AB (ref 22–32)
CREATININE: 0.62 mg/dL (ref 0.44–1.00)
Calcium: 9.1 mg/dL (ref 8.9–10.3)
GFR calc non Af Amer: >60 mL/min (ref >60)
Glucose, Bld: 101 mg/dL — ABNORMAL HIGH (ref 65–99)
Potassium: 3.9 mmol/L (ref 3.5–5.1)
SODIUM: 138 mmol/L (ref 135–145)
Total Bilirubin: 0.5 mg/dL (ref 0.3–1.2)
Total Protein: 6.8 g/dL (ref 6.5–8.1)

## 2015-09-21 LAB — ACETAMINOPHEN LEVEL: Acetaminophen (Tylenol), Serum: <10 ug/mL — ABNORMAL LOW (ref 10–30)

## 2015-09-21 LAB — ETHANOL: Alcohol, Ethyl (B): <5 mg/dL (ref <5)

## 2015-09-21 LAB — PHENYTOIN LEVEL, TOTAL: Phenytoin Lvl: <2.5 ug/mL — ABNORMAL LOW (ref 10.0–20.0)

## 2015-09-21 MED ORDER — NICOTINE 21 MG/24HR TD PT24: 21.0000 mg | MEDICATED_PATCH | Freq: Every day | TRANSDERMAL | Status: DC | PRN

## 2015-09-21 MED ORDER — PHENYTOIN SODIUM EXTENDED 100 MG PO CAPS
100.0000 mg | ORAL_CAPSULE | Freq: Three times a day (TID) | ORAL | Status: DC
Start: 2015-09-21 — End: 2015-09-22
  Administered 2015-09-21 – 2015-09-22 (×2): 100 mg via ORAL
  Filled 2015-09-21 (×2): qty 1

## 2015-09-21 MED ORDER — ARIPIPRAZOLE 10 MG PO TABS
5.0000 mg | ORAL_TABLET | Freq: Every day | ORAL | Status: DC
  Administered 2015-09-22: 5 mg via ORAL
  Filled 2015-09-21: qty 1

## 2015-09-21 MED ORDER — ZOLPIDEM TARTRATE 5 MG PO TABS: 5.0000 mg | ORAL_TABLET | Freq: Every evening | ORAL | Status: DC | PRN

## 2015-09-21 MED ORDER — SODIUM CHLORIDE 0.9 % IV BOLUS (SEPSIS)
1000.0000 mL | Freq: Once | INTRAVENOUS | Status: AC
  Administered 2015-09-21: 1000 mL via INTRAVENOUS

## 2015-09-21 MED ORDER — ACETAMINOPHEN 325 MG PO TABS: 650.0000 mg | ORAL_TABLET | ORAL | Status: DC | PRN
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
## 2015-09-21 NOTE — ED Notes (Signed)
Pt states she took 15 Dilantin tablets about 30 minutes ago and states she was trying to harm herself.

## 2015-09-21 NOTE — ED Notes (Signed)
Symptoms Hypotension, bradycardia, conduction delays. Poison control recommendations  Dilantin concentration now and every 4 hours until they come down.  Complete EKG Cardiac monitoring for 24 hours past ingestion.

## 2015-09-22 ENCOUNTER — Inpatient Hospital Stay (HOSPITAL_COMMUNITY)
Admission: AD | Admit: 2015-09-22 | Discharge: 2015-09-27 | DRG: 885 | Disposition: A | Discharge status: Home / Self Care | Payer: Medicare Other | Source: Intra-hospital | Attending: Psychiatry | Admitting: Psychiatry

## 2015-09-22 ENCOUNTER — Encounter (HOSPITAL_COMMUNITY): Payer: Self-pay | Admitting: *Deleted

## 2015-09-22 DIAGNOSIS — F3130 Bipolar disorder, current episode depressed, mild or moderate severity, unspecified: Secondary | ICD-10-CM | POA: Diagnosis not present

## 2015-09-22 DIAGNOSIS — F3132 Bipolar disorder, current episode depressed, moderate: Secondary | ICD-10-CM | POA: Insufficient documentation

## 2015-09-22 DIAGNOSIS — Z833 Family history of diabetes mellitus: Secondary | ICD-10-CM

## 2015-09-22 DIAGNOSIS — R45851 Suicidal ideations: Secondary | ICD-10-CM | POA: Diagnosis present

## 2015-09-22 DIAGNOSIS — G40909 Epilepsy, unspecified, not intractable, without status epilepticus: Secondary | ICD-10-CM | POA: Diagnosis present

## 2015-09-22 DIAGNOSIS — Z818 Family history of other mental and behavioral disorders: Secondary | ICD-10-CM | POA: Diagnosis not present

## 2015-09-22 DIAGNOSIS — F329 Major depressive disorder, single episode, unspecified: Secondary | ICD-10-CM | POA: Diagnosis not present

## 2015-09-22 DIAGNOSIS — K219 Gastro-esophageal reflux disease without esophagitis: Secondary | ICD-10-CM | POA: Diagnosis present

## 2015-09-22 DIAGNOSIS — F17210 Nicotine dependence, cigarettes, uncomplicated: Secondary | ICD-10-CM | POA: Diagnosis present

## 2015-09-22 DIAGNOSIS — F4312 Post-traumatic stress disorder, chronic: Secondary | ICD-10-CM | POA: Diagnosis present

## 2015-09-22 DIAGNOSIS — Z915 Personal history of self-harm: Secondary | ICD-10-CM | POA: Diagnosis not present

## 2015-09-22 DIAGNOSIS — F4310 Post-traumatic stress disorder, unspecified: Secondary | ICD-10-CM

## 2015-09-22 LAB — PHENYTOIN LEVEL, TOTAL
PHENYTOIN LVL: 6.5 ug/mL — AB (ref 10.0–20.0)
PHENYTOIN LVL: 6.2 ug/mL — AB (ref 10.0–20.0)

## 2015-09-22 MED ORDER — MAGNESIUM HYDROXIDE 400 MG/5ML PO SUSP: 30.0000 mL | Freq: Every day | ORAL | Status: DC | PRN

## 2015-09-22 MED ORDER — HYDROXYZINE HCL 25 MG PO TABS
25.0000 mg | ORAL_TABLET | Freq: Three times a day (TID) | ORAL | Status: DC | PRN
Start: 2015-09-22 — End: 2015-09-27
  Administered 2015-09-23 – 2015-09-26 (×2): 25 mg via ORAL
  Filled 2015-09-22 (×2): qty 1

## 2015-09-22 MED ORDER — ZOLPIDEM TARTRATE 5 MG PO TABS
5.0000 mg | ORAL_TABLET | Freq: Every evening | ORAL | Status: DC | PRN
  Administered 2015-09-22 – 2015-09-26 (×4): 5 mg via ORAL
  Filled 2015-09-22 (×5): qty 1

## 2015-09-22 MED ORDER — ACETAMINOPHEN 325 MG PO TABS: 650.0000 mg | ORAL_TABLET | Freq: Four times a day (QID) | ORAL | Status: DC | PRN

## 2015-09-22 NOTE — ED Notes (Addendum)
Pt given snack of RIce Krispy treat and Sprite.  PT consumed 100% of snack.

## 2015-09-22 NOTE — ED Notes (Signed)
Spoke with Patti from poison control.  Is recommending a follow up dilantin level due to the medication being extended release.  Will notify MD

## 2015-09-22 NOTE — ED Notes (Signed)
Pt ambulated to the bathroom with ease 

## 2015-09-22 NOTE — ED Notes (Signed)
Poison control called for update-- dilantin level given.

## 2015-09-22 NOTE — ED Notes (Signed)
IV repositioned to ensure infusion, pt sleeping

## 2015-09-22 NOTE — ED Provider Notes (Signed)
[place] assessment. Stable.  Mancel BaleElliott Reid Regas, MD 09/22/15 2041

## 2015-09-22 NOTE — ED Notes (Signed)
Pt ambulated to restroom.  Gait steady and even.   

## 2015-09-22 NOTE — ED Provider Notes (Signed)
18:20- she is stable, desires to go to the behavioral health Hospital.  Mancel BaleElliott Odessa Nishi, MD 09/22/15 Rickey [id number]

## 2015-09-22 NOTE — ED Notes (Signed)
Patient was given a snack and drink. A regular diet ordered for patient for lunch.

## 2015-09-22 NOTE — BH Assessment (Signed)
[facility] Assessment Progress Note   Clinician informed nurses Kendal HymenBonnie & Clydie BraunKaren that [facility]  Dr. Jama Flavorsobos accepting.  Nurse call report to [phone].

## 2015-09-22 NOTE — ED Notes (Signed)
[facility]

## 2015-09-22 NOTE — ED Notes (Signed)
Contacted [facility] to [facility].

## 2015-09-22 NOTE — ED Notes (Signed)
Rinaldo Cloudamela from poison control called to follow up. Stated phenytoin level was ok and we should check it again around noon today.
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
## 2015-09-23 ENCOUNTER — Encounter (HOSPITAL_COMMUNITY): Payer: Self-pay | Admitting: Psychiatry

## 2015-09-23 DIAGNOSIS — F3130 Bipolar disorder, current episode depressed, mild or moderate severity, unspecified: Secondary | ICD-10-CM

## 2015-09-23 MED ORDER — NICOTINE POLACRILEX 2 MG MT GUM
2.0000 mg | CHEWING_GUM | OROMUCOSAL | Status: DC | PRN
Start: 2015-09-23 — End: 2015-09-27
  Administered 2015-09-23: 2 mg via ORAL
  Filled 2015-09-23: qty 1

## 2015-09-23 MED ORDER — LURASIDONE HCL 40 MG PO TABS
40.0000 mg | ORAL_TABLET | Freq: Every day | ORAL | Status: DC
  Administered 2015-09-24 – 2015-09-27 (×4): 40 mg via ORAL
  Filled 2015-09-23 (×6): qty 1

## 2015-09-23 MED ORDER — SERTRALINE HCL 50 MG PO TABS
50.0000 mg | ORAL_TABLET | Freq: Every day | ORAL | Status: DC
  Administered 2015-09-23 – 2015-09-27 (×5): 50 mg via ORAL
  Filled 2015-09-23 (×7): qty 1
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
## 2015-09-23 NOTE — Tx Team (Signed)
Initial Interdisciplinary Treatment Plan   PATIENT STRESSORS: Marital or family conflict Medication change or noncompliance   PATIENT STRENGTHS: Average or above average intelligence Capable of independent living Communication skills General fund of knowledge Motivation for treatment/growth Work skills   PROBLEM LIST: Problem List/Patient Goals Date to be addressed Date deferred Reason deferred Estimated date of resolution  "attempted suicide, overdosed on my medication, Dilantin" 09-22-15     "been under stress since I been here last time, family issues" 09-22-15                                                DISCHARGE CRITERIA:  Improved stabilization in mood, thinking, and/or behavior Need for constant or close observation no longer present Reduction of life-threatening or endangering symptoms to within safe limits Verbal commitment to aftercare and medication compliance  PRELIMINARY DISCHARGE PLAN: Attend PHP/IOP Participate in family therapy Return to previous living arrangement  PATIENT/FAMIILY INVOLVEMENT: This treatment plan has been presented to and reviewed with the patient, Lamonte RicherDawn Marie Noguchi, and/or family member.  The patient and family have been given the opportunity to ask questions and make suggestions.  Mickeal NeedyJohnson, Lionel Woodberry N 09/23/2015, 12:15 AM

## 2015-09-23 NOTE — Progress Notes (Signed)
NSG shift assessment. 7a-7p.   D: Affect blunted, mood depressed, behavior appropriate. Attends groups and participates. Pt reports that her sleep is fair and she needed medication to help her sleep.  She rates her depression as 6, hopelessness as 6, and anxiety is 6 out of 10. She has thoughts of suicide sometimes, but is able to contract for safety. Her goal today is to work on proper medications and to stay on her medications. To accomplish that goal she will remain positive.  Cooperative with staff and is getting along well with peers.   A: Observed pt interacting in group and in the milieu: Support and encouragement offered. Safety maintained with observations every 15 minutes.   R:   Contracts for safety and continues to follow the treatment plan, working on learning new coping skills.

## 2015-09-23 NOTE — Progress Notes (Signed)
Recreation Therapy Notes  Date: 01.13.2017  Time: 9:30am Location: 300 Group Room   Group Topic: Stress Management  Goal Area(s) Addresses:  Patient will actively participate in stress management techniques presented during session.   Behavioral Response: Did not attend.   Lewayne Pauley L Kalvyn Desa, LRT/CTRS        Akshar Starnes L 09/23/2015 4:02 PM 

## 2015-09-23 NOTE — Tx Team (Signed)
Interdisciplinary Treatment Plan Update (Adult) Date: 09/23/2015   Date: 09/23/2015 8:18 AM  Progress in Treatment:  Attending groups: Pt is new to milieu, continuing to assess  Participating in groups: Pt is new to milieu, continuing to assess  Taking medication as prescribed: Yes  Tolerating medication: Yes  Family/Significant othe contact made: No, CSW assessing for appropriate contact Patient understands diagnosis: Continuing to assess Discussing patient identified problems/goals with staff: Yes  Medical problems stabilized or resolved: Yes  Denies suicidal/homicidal ideation: No, Pt recently admitted with SI post overdose Patient has not harmed self or Others: Yes   New problem(s) identified: None identified at this time.   Discharge Plan or Barriers: Pt will return home and follow-up with [facility] in [facility]  Additional comments:  Patient and CSW reviewed pt's identified goals and treatment plan. Patient verbalized understanding and agreed to treatment plan. CSW reviewed [facility] "Discharge Process and Patient Involvement" Form. Pt verbalized understanding of information provided and signed form.   Reason for Continuation of Hospitalization:  Depression Medication stabilization Suicidal ideation  Estimated length of stay: 3-5 days  Review of initial/current patient goals per problem list:   1.  Goal(s): Patient will participate in aftercare plan  Met:  Yes  Target date: 3-5 days from date of admission   As evidenced by: Patient will participate within aftercare plan AEB aftercare provider and housing plan at discharge being identified.   09/23/15: Pt will return home and follow-up with [facility] in [facility]  2.  Goal (s): Patient will exhibit decreased depressive symptoms and suicidal ideations.  Met:  No  Target date: 3-5 days from date of admission   As evidenced by: Patient will utilize self rating of depression at 3 or below and demonstrate decreased signs  of depression or be deemed stable for discharge by MD. 09/23/15: Pt was admitted with symptoms of depression, rating 10/10. Pt continues to present with flat affect and depressive symptoms.  Pt will demonstrate decreased symptoms of depression and rate depression at 3/10 or lower prior to discharge.  Attendees:  Patient:    Family:    Physician: Dr. Parke Poisson, MD  09/23/2015 8:18 AM  Nursing: Lars Pinks, RN Case manager  09/23/2015 8:18 AM  Clinical Social Worker Peri Maris, [place] 09/23/2015 8:18 AM  Other: Tilden Fossa, [place] 09/23/2015 8:18 AM  Clinical: Lise Auer RN 09/23/2015 8:18 AM  Other: , RN Charge Nurse 09/23/2015 8:18 AM  Other: Hilda Lias, [place], [place] Social Work [phone]

## 2015-09-23 NOTE — Progress Notes (Signed)
Patient ID: Paula Little, female   DOB: 28-May-1986, 30 y.o.   MRN: [id number] Per State regulations 482.30 this chart was reviewed for medical necessity with respect to the patient's admission/duration of stay.    Next review date: 09/26/15  Thurman CoyerEric Felina Tello, BSN, RN-BC  Case Manager

## 2015-09-23 NOTE — [facility] Group Notes (Addendum)
[facility]  09/23/2015 8:45 AM  Participation Quality: Alert, Appropriate and Oriented  Mood/Affect: Flat  Depression Rating: "bad"  Anxiety Rating: "bad"  Thoughts of Suicide: Pt endorses SI  Will you contract for safety? Yes  Current AVH: Pt denies  Plan for Discharge/Comments: Pt attended discharge planning group and actively participated in group. CSW discussed suicide prevention education with the group and encouraged them to discuss discharge planning and any relevant barriers. Pt observed to be lethargic in group, expressed being sleepy. She identifies increased depression and anxiety this morning.  Transportation Means: Pt reports access to transportation  Supports: No supports mentioned at this time  Chad CordialLauren Carter, Theresia MajorsLCSWA 09/23/2015 4:27 PM
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
## 2015-09-23 NOTE — Progress Notes (Signed)
Pt did not attend wrap-up group   

## 2015-09-23 NOTE — Progress Notes (Signed)
Patient ID: Lamonte Richerawn Marie Dauphine, female   DOB: 1986-03-29, 30 y.o.   MRN: [id number] D: Client in bed most of the evening reports day been "alright" depression "5" of 10. A: Writer encouraged client to attend group and report any concerns, none verbalized at this time. Staff will monitor q6415min for safety. R: Client is safe on the unit, did not attend unit.
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
## 2015-09-23 NOTE — Progress Notes (Signed)
Patient ID: Paula Little, female   DOB: [birth year], 30 y.o.   MRN: [id number] Client is 30 yo female admitted after a suicide attempt "I attempted suicide, I overdosed on medication. Dilantin." "been under stress since I left here, family issues"  Client was a recently discharged in 12/16. Client reports hx of seizures, the last one was 2 1/2 months ago. Client was made a fall risk. Client also reports "my left thumb is out of socket, I woke up the other day with it like this" client also has redness beneath abdominal fold. Client currently contracts for safety. Client oriented to room and unit. Staff will monitor q5315min for safety. Client is safe on the unit.

## 2015-09-23 NOTE — [facility] Group Notes (Signed)
[facility] LCSW Group Therapy 09/23/2015 1:15pm  Type of Therapy: Group Therapy- Feelings Around Relapse and Recovery  Participation Level: Active   Participation Quality:  Appropriate  Affect:  Appropriate  Cognitive: Alert and Oriented   Insight:  Developing   Engagement in Therapy: Developing/Improving and Engaged   Modes of Intervention: Clarification, Confrontation, Discussion, Education, Exploration, Limit-setting, Orientation, Problem-solving, Rapport Building, Dance movement psychotherapisteality Testing, Socialization and Support  Summary of Progress/Problems: The topic for today was feelings about relapse. The group discussed what relapse prevention is to them and identified triggers that they are on the path to relapse. Members also processed their feeling towards relapse and were able to relate to common experiences. Group also discussed coping skills that can be used for relapse prevention.  Pt expresses that she feels that she needs to cut off family members in order to maintain wellness. Per Pt, her family is a major trigger for her depression and anxiety.    Therapeutic Modalities:   Cognitive Behavioral Therapy Solution-Focused Therapy Assertiveness Training Relapse Prevention Therapy    Paula SprinklesLauren Carter, LCSWA [phone] 09/23/2015 4:34 PM

## 2015-09-24 LAB — TSH: TSH: 1.499 u[IU]/mL (ref 0.350–4.500)

## 2015-09-24 MED ORDER — FAMOTIDINE 10 MG PO TABS
10.0000 mg | ORAL_TABLET | Freq: Every day | ORAL | Status: DC
  Administered 2015-09-24 – 2015-09-27 (×4): 10 mg via ORAL
  Filled 2015-09-24 (×6): qty 1

## 2015-09-24 MED ORDER — PHENYTOIN SODIUM EXTENDED 100 MG PO CAPS
100.0000 mg | ORAL_CAPSULE | Freq: Three times a day (TID) | ORAL | Status: DC
  Administered 2015-09-24 – 2015-09-26 (×6): 100 mg via ORAL
  Filled 2015-09-24 (×11): qty 1

## 2015-09-24 NOTE — [facility] Group Notes (Signed)
[facility] Group Notes:  (Clinical Social Work)  09/24/2015     1:15-2:15PM  Summary of Progress/Problems:   The main focus of today's process group was to discuss unhealthy coping techniques that the patients often use, and how to consider whether this is something they want to change.  Motivational Interviewing was utilized to help patients explore in depth the perceived benefits and costs of unhealthy coping techniques, as well as the  benefits and costs of replacing that with healthy coping skills.    The patient expressed that their own unhealthy coping involves being a people pleaser, especially with her family.  She talked frequently during group, at times with insight.  She talked about how her mother wanted her to snort pills with her, and when she turned it down, mother acted offended.  The end result was that she felt guilty for choosing not to do drugs.  Type of Therapy:  Group Therapy - Process   Participation Level:  Active  Participation Quality:  Attentive, Sharing and Supportive  Affect:  Blunted  Cognitive:  Appropriate and Oriented  Insight:  Engaged  Engagement in Therapy:  Engaged  Modes of Intervention:  Education, Motivational Interviewing  Ambrose MantleMareida Grossman-Orr, LCSW 09/24/2015, 4:00 PM

## 2015-09-24 NOTE — Progress Notes (Signed)
Patient ID: Paula Little, female   DOB: 03/30/1986, 30 y.o.   MRN: [id number] D: client is visible on the unit, seen walking the hall "there is nothing to do, so I walk" "my fiance' came to day, Client reports depression "8" of 10.  I hope he can come to get me when it's time to go"  A: Writer encouraged client to report any concerns. Medications reviewed, administered as ordered. Staff will monitor q4815min for safety. R: Client is safe on the unit, attended group.

## 2015-09-24 NOTE — Progress Notes (Signed)
[name] is seen [facility] on the 400 hall today...she tolerates this well. She is more talkative today...both to staff and to other patients. She takes her scheduled meds as planned. She attends her groups and she completes her daily assessment first thing this morning and on it she wrote she denied SI and she rated her depression, hopeelssness and anxiety " 7/7/6", respectively.   A She is  Complaint with therapies and is talking about ways to develop healtheir coping skills.   R Safety is in place.

## 2015-09-24 NOTE — Progress Notes (Signed)
[facility]  09/24/2015 10:11 AM Paula Little   MRN:  [id number] Paula Little   Subjective:  " I am still feeling the same just blah.. Almost like depressed."  Objective: Paula Little is awake, alert and oriented X4 , found interacting with peers.   Denies suicidal or homicidal ideation. Denies auditory or visual hallucination and does not appear to be responding to internal stimuli. Patient interacts well with staff and others. Patient reports she is medication compliant without mediation side effects. States she just started taking Lutuda 40mg  and she can't feel it working yet. Patient reports that she felt like she rushed treatment last time she was inpatient and she is willing to stay this time for as long as it takes. States " I am still feeling the same just blah. Patient reports a lack of energy and depression. ( due to the passing of her brother) states her depression 5/10. Reports good appetite other wise and resting okay . Support, encouragement and reassurance was provided.   Principal Problem: Bipolar I disorder, most recent episode depressed (HCC) Diagnosis:   Patient Active Problem List   Diagnosis Date Noted  . Bipolar affective disorder, currently depressed, moderate (HCC) [F31.32] 09/22/2015  . Post traumatic stress disorder (PTSD) [F43.10] 09/22/2015  . Severe episode of recurrent major depressive disorder, without psychotic features (HCC) [F33.2]   . Bipolar I disorder, most recent episode depressed (HCC) [F31.30] 08/12/2015  . MDD (major depressive disorder) (HCC) [F32.9] 08/11/2015  . Seizure disorder (HCC) [G40.909]   . Bipolar 1 disorder, mixed, moderate (HCC) [F31.62]   . Intentional overdose of drug in tablet form (HCC) [T50.902A] 08/06/2015  . Overdose [T50.901A] 08/06/2015  . Suicidal intent [R45.851] 08/06/2015  . Cervicitis [N72] 03/31/2013  . Chronic constipation [K59.00] 11/12/2012  . Chronic lower back pain [M54.5, G89.29]  11/12/2012  . Hot flashes [N95.1] 11/12/2012  . Kidney stones [N20.0]   . Anemia [D64.9]    Total Time spent with patient: 30 minutes  Past Psychiatric History: Depression, Anxiety, Bipolar  Past Medical History:  Past Medical History  Diagnosis Date  . Kidney stones   . Anemia   . Chronic abdominal pain   . Seizures (HCC)   . Depression     Past Surgical History  Procedure Laterality Date  . Cholecystectomy     Family History: History reviewed. No pertinent family history. Family Psychiatric  History: Mother Biplor, PSTD, Depression, Personality disorder  Social History:  History  Alcohol Use No     History  Drug Use No    Social History   Social History  . Marital Status: Married    Spouse Name: N/A  . Number of Children: N/A  . Years of Education: N/A   Social History Main Topics  . Smoking status: Current Every Day Smoker -- 1.00 packs/day    Types: Cigarettes  . Smokeless tobacco: None  . Alcohol Use: No  . Drug Use: No  . Sexual Activity: Yes   Other Topics Concern  . None   Social History Narrative   Additional Social History:                         Sleep: Fair  Appetite:  Good  Current Medications: Current Facility-Administered Medications  Medication Dose Route Frequency Provider Last Rate Last Dose  . acetaminophen (TYLENOL) tablet 650 mg  650 mg Oral Q6H PRN Worthy FlankIjeoma E Nwaeze, NP      .  hydrOXYzine (ATARAX/VISTARIL) tablet 25 mg  25 mg Oral TID PRN Worthy Flank, NP   25 mg at 09/23/15 9604  . lurasidone (LATUDA) tablet 40 mg  40 mg Oral Q breakfast Craige Cotta, MD   40 mg at 09/24/15 0810  . magnesium hydroxide (MILK OF MAGNESIA) suspension 30 mL  30 mL Oral Daily PRN Worthy Flank, NP      . nicotine polacrilex (NICORETTE) gum 2 mg  2 mg Oral PRN Craige Cotta, MD   2 mg at 09/23/15 1320  . sertraline (ZOLOFT) tablet 50 mg  50 mg Oral Daily Craige Cotta, MD   50 mg at 09/24/15 0810  . zolpidem (AMBIEN) tablet 5  mg  5 mg Oral QHS PRN Worthy Flank, NP   5 mg at 09/22/15 2339    Lab Results:  Results for orders placed or performed during the hospital encounter of 09/22/15 (from the past 48 hour(s))  TSH     Status: None   Collection Time: 09/24/15  6:45 AM  Result Value Ref Range   TSH 1.499 0.350 - 4.500 uIU/mL    Comment: Performed at [facility]    Physical Findings: AIMS: Facial and Oral Movements Muscles of Facial Expression: None, normal Lips and Perioral Area: None, normal Jaw: None, normal Tongue: None, normal,Extremity Movements Upper (arms, wrists, hands, fingers): None, normal Lower (legs, knees, ankles, toes): None, normal, Trunk Movements Neck, shoulders, hips: None, normal, Overall Severity Severity of abnormal movements (highest score from questions above): None, normal Incapacitation due to abnormal movements: None, normal Patient's awareness of abnormal movements (rate only patient's report): No Awareness, Dental Status Current problems with teeth and/or dentures?:  (caries) Does patient usually wear dentures?: No  CIWA:    COWS:     Musculoskeletal: Strength & Muscle Tone: within normal limits Gait & Station: normal Patient leans: N/A  Psychiatric Specialty Exam: Review of Systems  Constitutional: Negative.   HENT: Negative.   Eyes: Negative.   Respiratory: Negative.   Cardiovascular: Negative.   Gastrointestinal: Negative.   Genitourinary: Negative.   Musculoskeletal: Negative.   Skin: Negative.   Neurological: Negative.   Endo/Heme/Allergies: Negative.   Psychiatric/Behavioral: Positive for depression. Negative for suicidal ideas. The patient is nervous/anxious. The patient does not have insomnia.   All other systems reviewed and are negative.   Blood pressure 99/67, pulse 79, temperature 98.1 F (36.7 C), temperature source Oral, resp. rate 16, height 5' (1.524 m), weight 94.802 kg (209 lb), last menstrual period 09/21/2015.Body mass  index is 40.82 kg/(m^2).  General Appearance: Casual, pleasant, clam and cooperative  Eye Contact::  Good  Speech:  Clear and Coherent and Pressured  Volume:  Normal  Mood:  Anxious and Depressed  Affect:  Depressed and Flat  Thought Process:  Coherent, Loose and Tangential  Orientation:  Full (Time, Place, and Person)  Thought Content:  Hallucinations: Auditory Visual, states history not at this time  Suicidal Thoughts:  No denies today  Homicidal Thoughts:  No  Memory:  Immediate;   Fair Recent;   Fair Remote;   Fair  Judgement:  Fair  Insight:  Lacking  Psychomotor Activity:  Restlessness  Concentration:  Poor  Recall:  [employer] of Knowledge:Poor  Language: Fair  Akathisia:  No  Handed:  Right  AIMS (if indicated):     Assets:  Community education officer  ADL's:  Intact  Cognition: WNL  Sleep:  Number of Hours: 6.75  I agree with current treatment plan on 09/24/2015, Patient seen face-to-face for psychiatric evaluation follow-up, chart reviewed.  Treatment Plan Summary:  Daily contact with patient to assess and evaluate symptoms and progress in treatment and Medication management  Patient states she does not think Abilify was helping much - prefers to start new medication- we discussed options- she wants to try to avoid a medication that may have significant weight gain potential. Interested in LATUDA. We discussed side effects. Start LATUDA 40 mgrs QDAY  Also , remembers past Zoloft trial years ago- does not remember side effects Will start ZOLOFT 50 mgrs QDAY - patient does not remember having any allergic reaction to this medications Start Dilantin ER 100 mg TID for history of seizure disorder, states this medication is helpful and well tolerated. Rechecked level Phenytoin level on 09/27/2015 Start Pepcid 10 mg PO daily for GERD  Oneta Rack FNP [facility] 09/24/2015, 10:11 AM

## 2015-09-25 NOTE — Plan of Care (Signed)
Problem: Diagnosis: Increased Risk For Suicide Attempt Goal: STG-Patient Will Comply With Medication Regime Outcome: Progressing Pt compliant with medication regime     

## 2015-09-25 NOTE — Progress Notes (Signed)
Adult Psychoeducational Group Note  Date:  09/25/2015 Time:  9:23 PM  Group Topic/Focus:  Wrap-Up Group:   The focus of this group is to help patients review their daily goal of treatment and discuss progress on daily workbooks.  Participation Level:  Active  Participation Quality:  Appropriate  Affect:  Appropriate  Cognitive:  Alert  Insight: Appropriate  Engagement in Group:  Engaged  Modes of Intervention:  Discussion  Additional Comments:  Pt stated that her day was an 8 overall. Her goal is to continue to stay in a positive state of mind.   Kaleen OdeaCOOKE, Shannon Kirkendall R 09/25/2015, 9:23 PM

## 2015-09-25 NOTE — Progress Notes (Signed)
Adult Psychoeducational Group Note  Date:  09/25/2015 Time:  8:00pm  Group Topic/Focus:  Wrap-Up Group:   The focus of this group is to help patients review their daily goal of treatment and discuss progress on daily workbooks.  Participation Level:  Active  Participation Quality:  Appropriate and Attentive  Affect:  Appropriate  Cognitive:  Alert and Appropriate  Insight: Appropriate  Engagement in Group:  Engaged  Modes of Intervention:  Discussion  Additional Comments:  Pt was attentive and appropriate during tonight's group discussion. Pt stated that today was a good day and that she enjoyed her visitor she love him very much. Pt shared that she is here to find a proper medication schedule. Overall a good day.   Bing PlumeScott, Adrian Dinovo D 09/25/2015, 1:31 AM

## 2015-09-25 NOTE — Progress Notes (Signed)
D: Patient in room on approach. Pt preoccupied about extended family issues that she is trying to get answers to. Pt denies SI/HI/AVH and pain. Pt attended and participated in evening wrap up group. Cooperative with assessment.  A: Met with pt 1:1. Medications administered as prescribed. Support and encouragement provided to attend groups and engage in milieu.  R: Patient remains safe and complaint with medications.

## 2015-09-25 NOTE — [facility] Group Notes (Addendum)
[facility] LCSW Group Therapy  09/25/2015   9 AM  Type of Therapy:  Group Therapy  Participation Level:  Active  Participation Quality:  Monopolizing  Affect:  Blunted  Cognitive:  Alert  Insight:  Limited  Engagement in Therapy:  Monopolizing  Modes of Intervention:  Clarification, Discussion, Education, Exploration, Rapport Building, Redirection Socialization and Support   Summary of Progress/Problems: The main focus of today's process group was to identify the patient's current support system and decide on other supports that can be put in place. An emphasis was placed on using counselor, doctor, therapy groups, 12-step groups, and problem-specific support groups to expand supports. There was also an extensive discussion about what constitutes a healthy support versus an unhealthy support. Patient easily shared to the point of monopolization yet was open to redirection. Patient was unwilling to process adding new supports and stated that she will rely solely on her significant other.  Carney Bernatherine C Harrill, LCSW

## 2015-09-25 NOTE — Progress Notes (Signed)
[facility] MD Progress Note  09/25/2015 1:03 PM Paula Little   MRN:  [id number] Matthew Sudie Bailey   Subjective:  " I am feeling the same as yesterday, but I am attending group sessions."  Objective: Paula Little is awake, alert and oriented X4 , found interacting with peers in the dayroom.   Denies suicidal or homicidal ideation. Denies auditory or visual hallucination and does not appear to be responding to internal stimuli. Patient reports interacting well with staff and others. Patient reports she is medication compliant without mediation side effects. Patient reports that she felt like she rushed treatment last time she was inpatient and she is willing to stay this time for as long as it takes. States " I am still feeling the same as yesterday but I am going to stick this treatment out. Patient reports a lack of energy and depression. States her depression 6/10. Reports good appetite and is resting okay when she makes her self stay awake throughout the day. Support, encouragement and reassurance was provided.   Principal Problem: Bipolar I disorder, most recent episode depressed (HCC) Diagnosis:   Patient Active Problem List   Diagnosis Date Noted  . Bipolar affective disorder, currently depressed, moderate (HCC) [F31.32] 09/22/2015  . Post traumatic stress disorder (PTSD) [F43.10] 09/22/2015  . Severe episode of recurrent major depressive disorder, without psychotic features (HCC) [F33.2]   . Bipolar I disorder, most recent episode depressed (HCC) [F31.30] 08/12/2015  . MDD (major depressive disorder) (HCC) [F32.9] 08/11/2015  . Seizure disorder (HCC) [G40.909]   . Bipolar 1 disorder, mixed, moderate (HCC) [F31.62]   . Intentional overdose of drug in tablet form (HCC) [T50.902A] 08/06/2015  . Overdose [T50.901A] 08/06/2015  . Suicidal intent [R45.851] 08/06/2015  . Cervicitis [N72] 03/31/2013  . Chronic constipation [K59.00] 11/12/2012  . Chronic lower back pain [M54.5, G89.29]  11/12/2012  . Hot flashes [N95.1] 11/12/2012  . Kidney stones [N20.0]   . Anemia [D64.9]    Total Time spent with patient: 20 minutes  Past Psychiatric History: Depression, Anxiety, Bipolar  Past Medical History:  Past Medical History  Diagnosis Date  . Kidney stones   . Anemia   . Chronic abdominal pain   . Seizures (HCC)   . Depression     Past Surgical History  Procedure Laterality Date  . Cholecystectomy     Family History: History reviewed. No pertinent family history. Family Psychiatric  History: Mother Biplor, PSTD, Depression, Personality disorder  Social History:  History  Alcohol Use No     History  Drug Use No    Social History   Social History  . Marital Status: Married    Spouse Name: N/A  . Number of Children: N/A  . Years of Education: N/A   Social History Main Topics  . Smoking status: Current Every Day Smoker -- 1.00 packs/day    Types: Cigarettes  . Smokeless tobacco: None  . Alcohol Use: No  . Drug Use: No  . Sexual Activity: Yes   Other Topics Concern  . None   Social History Narrative   Additional Social History:                         Sleep: Fair  Appetite:  Good  Current Medications: Current Facility-Administered Medications  Medication Dose Route Frequency Provider Last Rate Last Dose  . acetaminophen (TYLENOL) tablet 650 mg  650 mg Oral Q6H PRN Worthy Flank, NP      .  famotidine (PEPCID) tablet 10 mg  10 mg Oral Daily Oneta Rackanika N Adalida Garver, NP   10 mg at 09/25/15 0754  . hydrOXYzine (ATARAX/VISTARIL) tablet 25 mg  25 mg Oral TID PRN Worthy FlankIjeoma E Nwaeze, NP   25 mg at 09/23/15 [id number]  . lurasidone (LATUDA) tablet 40 mg  40 mg Oral Q breakfast Craige CottaFernando A Cobos, MD   40 mg at 09/25/15 0754  . magnesium hydroxide (MILK OF MAGNESIA) suspension 30 mL  30 mL Oral Daily PRN Worthy FlankIjeoma E Nwaeze, NP      . nicotine polacrilex (NICORETTE) gum 2 mg  2 mg Oral PRN Craige CottaFernando A Cobos, MD   2 mg at 09/23/15 1320  . phenytoin (DILANTIN) ER  capsule 100 mg  100 mg Oral TID Oneta Rackanika N Nela Bascom, NP   100 mg at 09/25/15 1158  . sertraline (ZOLOFT) tablet 50 mg  50 mg Oral Daily Craige CottaFernando A Cobos, MD   50 mg at 09/25/15 0754  . zolpidem (AMBIEN) tablet 5 mg  5 mg Oral QHS PRN Worthy FlankIjeoma E Nwaeze, NP   5 mg at 09/24/15 2105    Lab Results:  Results for orders placed or performed during the hospital encounter of 09/22/15 (from the past 48 hour(s))  TSH     Status: None   Collection Time: 09/24/15  6:45 AM  Result Value Ref Range   TSH 1.499 0.350 - 4.500 uIU/mL    Comment: [facility]    Physical Findings: AIMS: Facial and Oral Movements Muscles of Facial Expression: None, normal Lips and Perioral Area: None, normal Jaw: None, normal Tongue: None, normal,Extremity Movements Upper (arms, wrists, hands, fingers): None, normal Lower (legs, knees, ankles, toes): None, normal, Trunk Movements Neck, shoulders, hips: None, normal, Overall Severity Severity of abnormal movements (highest score from questions above): None, normal Incapacitation due to abnormal movements: None, normal Patient's awareness of abnormal movements (rate only patient's report): No Awareness, Dental Status Current problems with teeth and/or dentures?:  (caries) Does patient usually wear dentures?: No  CIWA:    COWS:     Musculoskeletal: Strength & Muscle Tone: within normal limits Gait & Station: normal Patient leans: N/A  Psychiatric Specialty Exam: Review of Systems  Constitutional: Negative.   HENT: Negative.   Eyes: Negative.   Respiratory: Negative.   Cardiovascular: Negative.   Gastrointestinal: Negative.   Genitourinary: Negative.   Musculoskeletal: Negative.   Skin: Negative.   Neurological: Negative.   Endo/Heme/Allergies: Negative.   Psychiatric/Behavioral: Positive for depression. Negative for suicidal ideas. The patient is nervous/anxious. The patient does not have insomnia.   All other systems reviewed and  are negative.   Blood pressure 99/66, pulse 94, temperature 97.8 F (36.6 C), temperature source Oral, resp. rate 18, height 5' (1.524 m), weight 94.802 kg (209 lb), last menstrual period 09/21/2015.Body mass index is 40.82 kg/(m^2).  General Appearance: Casual and Fairly Groomed, pleasant, clam and cooperative  Eye Contact::  Good  Speech:  Clear and Coherent and Normal Rate  Volume:  Normal  Mood:  Anxious and Depressed  Affect:  Depressed and Flat  Thought Process:  Coherent, Linear and Logical  Orientation:  Full (Time, Place, and Person)  Thought Content:  Hallucinations: Auditory Visual, states history not at this time  Suicidal Thoughts:  No denies today  Homicidal Thoughts:  No  Memory:  Immediate;   Fair Recent;   Fair Remote;   Fair  Judgement:  Fair  Insight:  Lacking  Psychomotor Activity:  Normal  Concentration:  Poor  Recall:  [employer] of Knowledge:Poor  Language: Fair  Akathisia:  No  Handed:  Right  AIMS (if indicated):     Assets:  Community education officer  ADL's:  Intact  Cognition: WNL  Sleep:  Number of Hours: 6.75     I agree with current treatment plan on 09/25/2015, Patient seen face-to-face for psychiatric evaluation follow-up, chart reviewed.  Treatment Plan Summary:  Daily contact with patient to assess and evaluate symptoms and progress in treatment and Medication management  Patient states she does not think Abilify was helping much - prefers to start new medication- we discussed options- she wants to try to avoid a medication that may have significant weight gain potential. Interested in LATUDA. We discussed side effects. Start LATUDA 40 mgrs QDAY  Also , remembers past Zoloft trial years ago- does not remember side effects Will start ZOLOFT 50 mgrs QDAY - patient does not remember having any allergic reaction to this medications Continue Dilantin ER 100 mg TID for history of seizure disorder, states this medication is  helpful and well tolerated. Rechecked level Phenytoin level on 09/30/2015 Contiune Pepcid 10 mg PO daily for GERD  Oneta Rack FNP [facility] 09/25/2015, 1:03 PM

## 2015-09-25 NOTE — Progress Notes (Signed)
Paula Little is doing well...saying to this writer " i'm beginning to feel more and more like myslef today". She takes her scheduled meds as planned...she attends her groups and she is actively trying to develop healthier coping skills as evidenced by her completion of her daily assessment. On this, she wrote she denied SI today, she rated her depression, hopelessness and anxiety " 6/6/6". A She has experienced no seizure-like activity today. R DC planning cont and safety in place.

## 2015-09-26 LAB — PHENYTOIN LEVEL, TOTAL: PHENYTOIN LVL: 2.7 ug/mL — AB (ref 10.0–20.0)

## 2015-09-26 MED ORDER — PHENYTOIN SODIUM EXTENDED 100 MG PO CAPS
200.0000 mg | ORAL_CAPSULE | Freq: Two times a day (BID) | ORAL | Status: DC
  Administered 2015-09-26 – 2015-09-27 (×2): 200 mg via ORAL
  Filled 2015-09-26 (×6): qty 2

## 2015-09-26 NOTE — Progress Notes (Signed)
Recreation Therapy Notes  Date: 01.16.2017 Time: 9:30am Location: [address] Group Room  Group Topic: Stress Management  Goal Area(s) Addresses:  Patient will actively participate in stress management techniques presented during session.   Behavioral Response: Did not attend.   Lemario Chaikin L Dolorez Jeffrey, LRT/CTRS        Demarrio Menges L 09/26/2015 11:55 AM 

## 2015-09-26 NOTE — Progress Notes (Signed)
Patient ID: Paula Little, female   DOB: 11/27/1985, 30 y.o.   MRN: [id number]   Pt currently presents with a blunted affect and euthymic behavior. Per self inventory, pt rates depression at a 5, hopelessness 5 and anxiety 7. Pt's daily goal is to "try to focus on my relationship (have positive thoughts)" and they intend to do so by "try to stay positive." Pt reports good sleep, a good appetite, normal energy and good concentration. 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 Pt mood euthymic, pleasant interactions with staff and peers.  Pt provided with medications per providers orders. Pt's labs and vitals were monitored throughout the day. Pt supported emotionally and encouraged to express concerns and questions. Pt educated on medications and alternative pain management for thumb and foot.   Pt's safety ensured with 15 minute and environmental checks. Pt currently denies SI/HI and A/V hallucinations. Pt verbally agrees to seek staff if SI/HI or A/VH occurs and to consult with staff before acting on these thoughts. Pt encouraged to speak with at home provider if thumb symptoms worsen. Will continue POC.

## 2015-09-26 NOTE — [facility] Group Notes (Signed)
[facility] LCSW Group Therapy  09/26/2015 1:15pm  Type of Therapy:  Group Therapy vercoming Obstacles  Participation Level:  Active  Participation Quality:  Appropriate   Affect:  Appropriate  Cognitive:  Appropriate and Oriented  Insight:  Developing/Improving and Improving  Engagement in Therapy:  Improving  Modes of Intervention:  Discussion, Exploration, Problem-solving and Support  Description of Group:   In this group patients will be encouraged to explore what they see as obstacles to their own wellness and recovery. They will be guided to discuss their thoughts, feelings, and behaviors related to these obstacles. The group will process together ways to cope with barriers, with attention given to specific choices patients can make. Each patient will be challenged to identify changes they are motivated to make in order to overcome their obstacles. This group will be process-oriented, with patients participating in exploration of their own experiences as well as giving and receiving support and challenge from other group members.  Summary of Patient Progress: Pt participated actively in group discussion and identified family conflict as a primary obstacle in her life. Pt described a life with more peace if her family conflicts were resolved. Pt identified a need to create a healthy environment and build positive relationships at discharge in order to overcome obstacles.   Therapeutic Modalities:   Cognitive Behavioral Therapy Solution Focused Therapy Motivational Interviewing Relapse Prevention Therapy   Chad CordialLauren Carter, LCSWA 09/26/2015 2:52 PM

## 2015-09-26 NOTE — [facility] Group Notes (Signed)
[facility]  09/26/2015 8:45 AM  Participation Quality: Alert, Appropriate and Oriented  Mood/Affect: Flat, Lethargic  Depression Rating: 5  Anxiety Rating: 6-7  Thoughts of Suicide: Pt denies SI/HI  Will you contract for safety? Yes  Current AVH: Pt denies  Plan for Discharge/Comments: Pt attended discharge planning group and actively participated in group. CSW discussed suicide prevention education with the group and encouraged them to discuss discharge planning and any relevant barriers. Pt expresses that she is feeling sleepy from the medication. She inquired about bus passes at discharge. No needs expressed at this time.  Transportation Means: Pt reports access to transportation  Supports: No supports mentioned at this time  Chad CordialLauren Carter, LCSWA 09/26/2015 9:32 AM

## 2015-09-26 NOTE — Progress Notes (Signed)
Patient ID: Paula Little, female   DOB: 04-May-1986, 30 y.o.   MRN: [id number] PER STATE REGULATIONS 482.30  THIS CHART WAS REVIEWED FOR MEDICAL NECESSITY WITH RESPECT TO THE PATIENT'S ADMISSION/DURATION OF STAY.  NEXT REVIEW DATE:09/30/15  Loura HaltBARBARA Zykia Walla, RN, BSN CASE MANAGER

## 2015-09-26 NOTE — Progress Notes (Signed)
Adult Psychoeducational Group Note  Date:  09/26/2015 Time:  10:20 PM  Group Topic/Focus:  Wrap-Up Group:   The focus of this group is to help patients review their daily goal of treatment and discuss progress on daily workbooks.  Participation Level:  Active  Participation Quality:  Appropriate  Affect:  Appropriate  Cognitive:  Appropriate  Insight: Appropriate  Engagement in Group:  Engaged  Modes of Intervention:  Discussion  Additional Comments:  Pt stated that she had a good day but she's starting to get a little anxious since she's getting discharged tomorrow. Pt goal is to stay focus on her relationship and her aftercare treatment.  Dellia NimsJaquesha M Beverlee Wilmarth 09/26/2015, 10:20 PM

## 2015-09-26 NOTE — Progress Notes (Signed)
Patient ID: Paula Little, female   DOB: 10/24/1985, 30 y.o.   MRN: [id number] [facility] MD Progress Note  09/26/2015 11:50 AM Paula Little   MRN:  [id number] Paula Little   Subjective:  Patient reports partial improvement of  Her mood . At present denies medication side effects. Reports she had a good visit from her fiance yesterday .  Objective:I have discussed case with treatment team and have met with patient. Patient currently reporting partial but significant improvement of her mood, and presents with a fuller range of affect . Tolerating medications well at this time. No akathisia noted or reported . She is visible in day room, going to groups, and interacting with selected peers . Behavior calm, in good control. As she improves , she is becoming more future oriented. States that on discharge plans to return to live with her fiance, who is described as very supportive. States she plans to minimize interaction with other family members because  " all the drama makes me sick". Patient has a history of seizure disorder, states last seizure was 3-4 months ago. She is on Dilantin- denies side effects. Of note, phenytoin serum level is sub-therapeutic at current dose ( 2.7)    Principal Problem: Bipolar I disorder, most recent episode depressed ([place]) Diagnosis:   Patient Active Problem List   Diagnosis Date Noted  . Bipolar affective disorder, currently depressed, moderate ([place]) [F31.32] 09/22/2015  . Post traumatic stress disorder (PTSD) [F43.10] 09/22/2015  . Severe episode of recurrent major depressive disorder, without psychotic features ([place]) [F33.2]   . Bipolar I disorder, most recent episode depressed ([place]) [F31.30] 08/12/2015  . MDD (major depressive disorder) ([place]) [F32.9] 08/11/2015  . Seizure disorder ([place]) [M42.683]   . Bipolar 1 disorder, mixed, moderate ([place]) [F31.62]   . Intentional overdose of drug in tablet form ([place]) [T50.902A] 08/06/2015  . Overdose  [T50.901A] 08/06/2015  . Suicidal intent [R45.851] 08/06/2015  . Cervicitis [N72] 03/31/2013  . Chronic constipation [K59.00] 11/12/2012  . Chronic lower back pain [M54.5, G89.29] 11/12/2012  . Hot flashes [N95.1] 11/12/2012  . Kidney stones [N20.0]   . Anemia [D64.9]    Total Time spent with patient: 20 minutes  Past Psychiatric History: Depression, Anxiety, Bipolar  Past Medical History:  Past Medical History  Diagnosis Date  . Kidney stones   . Anemia   . Chronic abdominal pain   . Seizures ([place])   . Depression     Past Surgical History  Procedure Laterality Date  . Cholecystectomy     Family History: History reviewed. No pertinent family history. Family Psychiatric  History: Mother Biplor, PSTD, Depression, Personality disorder  Social History:  History  Alcohol Use No     History  Drug Use No    Social History   Social History  . Marital Status: Married    Spouse Name: N/A  . Number of Children: N/A  . Years of Education: N/A   Social History Main Topics  . Smoking status: Current Every Day Smoker -- 1.00 packs/day    Types: Cigarettes  . Smokeless tobacco: None  . Alcohol Use: No  . Drug Use: No  . Sexual Activity: Yes   Other Topics Concern  . None   Social History Narrative   Additional Social History:   Sleep:  Improved   Appetite:  Good  Current Medications: Current Facility-Administered Medications  Medication Dose Route Frequency Provider Last Rate Last Dose  . acetaminophen (TYLENOL) tablet 650 mg  650 mg  Oral Q6H PRN Harriet Butte, NP      . famotidine (PEPCID) tablet 10 mg  10 mg Oral Daily Derrill Center, NP   10 mg at 09/26/15 0809  . hydrOXYzine (ATARAX/VISTARIL) tablet 25 mg  25 mg Oral TID PRN Harriet Butte, NP   25 mg at 09/23/15 1884  . lurasidone (LATUDA) tablet 40 mg  40 mg Oral Q breakfast Jenne Campus, MD   40 mg at 09/26/15 0811  . magnesium hydroxide (MILK OF MAGNESIA) suspension 30 mL  30 mL Oral Daily PRN  Harriet Butte, NP      . nicotine polacrilex (NICORETTE) gum 2 mg  2 mg Oral PRN Jenne Campus, MD   2 mg at 09/23/15 1320  . phenytoin (DILANTIN) ER capsule 200 mg  200 mg Oral BID Jenne Campus, MD      . sertraline (ZOLOFT) tablet 50 mg  50 mg Oral Daily Jenne Campus, MD   50 mg at 09/26/15 0810  . zolpidem (AMBIEN) tablet 5 mg  5 mg Oral QHS PRN Harriet Butte, NP   5 mg at 09/25/15 2126    Lab Results:  Results for orders placed or performed during the hospital encounter of 09/22/15 (from the past 48 hour(s))  Phenytoin level, total     Status: Abnormal   Collection Time: 09/26/15  6:08 AM  Result Value Ref Range   Phenytoin Lvl 2.7 (L) 10.0 - 20.0 ug/mL    Comment: Performed at [facility]    Physical Findings: AIMS: Facial and Oral Movements Muscles of Facial Expression: None, normal Lips and Perioral Area: None, normal Jaw: None, normal Tongue: None, normal,Extremity Movements Upper (arms, wrists, hands, fingers): None, normal Lower (legs, knees, ankles, toes): None, normal, Trunk Movements Neck, shoulders, hips: None, normal, Overall Severity Severity of abnormal movements (highest score from questions above): None, normal Incapacitation due to abnormal movements: None, normal Patient's awareness of abnormal movements (rate only patient's report): No Awareness, Dental Status Current problems with teeth and/or dentures?:  (caries) Does patient usually wear dentures?: No  CIWA:    COWS:     Musculoskeletal: Strength & Muscle Tone: within normal limits Gait & Station: normal Patient leans: N/A  Psychiatric Specialty Exam: Review of Systems  Constitutional: Negative.   HENT: Negative.   Eyes: Negative.   Respiratory: Negative.   Cardiovascular: Negative.   Gastrointestinal: Negative.   Genitourinary: Negative.   Musculoskeletal: Negative.   Skin: Negative.   Neurological: Negative.   Endo/Heme/Allergies: Negative.    Psychiatric/Behavioral: Positive for depression. Negative for suicidal ideas. The patient is nervous/anxious. The patient does not have insomnia.   All other systems reviewed and are negative.   Blood pressure 94/64, pulse 89, temperature 97.8 F (36.6 C), temperature source Oral, resp. rate 16, height 5' (1.524 m), weight 209 lb (94.802 kg), last menstrual period 09/21/2015.Body mass index is 40.82 kg/(m^2).  General Appearance: improved grooming   Eye Contact::  Good  Speech:  Clear and Coherent and Normal Rate  Volume:  Normal  Mood:  Improving, less anxious, less depressed   Affect:   More reactive   Thought Process:   Linear   Orientation:  Full (Time, Place, and Person)  Thought Content:   Currently not internally preoccupied and does not endorse current auditory or visual hallucinations  Suicidal Thoughts:  No  At this time denies plan or intention of hurting self or of suicidal ideations  Homicidal Thoughts:  No  Memory:  Immediate;   Fair Recent;   Fair Remote;   Fair  Judgement:   Improving   Insight:  Fair  Psychomotor Activity:  Normal  Concentration:  Good  Recall:  Good  Fund of Knowledge:Good  Language: Good  Akathisia:  No  Handed:  Right  AIMS (if indicated):     Assets:  Chartered certified accountant  ADL's:  Intact  Cognition: WNL  Sleep:  Number of Hours: 6.75    Assessment - patient improving compared to admission- presents with improving mood, improved range of affect. At this time denies SI and is future oriented, stating that she plans to minimize interactions with certain family members whom she feels cause unnecessary  "drama"  in her life . Thus far tolerating Zoloft and Latuda well. Of note, Dilantin level sub-therapeuti- denies side effects,  but no seizure activity on unit   Treatment Plan Summary:  Daily contact with patient to assess and evaluate symptoms and progress in treatment and Medication management  Encourage group ,  milieu participation to work on coping skills and symptom reduction Continue  LATUDA 40 mgrs QDAY  Continue ZOLOFT 50 mgrs QDAY - patient does not remember having any allergic reaction to this medications Increase  Dilantin ER to 200 mgrsd BID , due to sub-therapeutic level and no reported side effects, for history of seizure disorder.  Contiune Pepcid 10 mg PO daily for GERD Treatment team working on disposition planning options  COBOS, Felicita Gage  MD  09/26/2015, 11:50 AM

## 2015-09-26 NOTE — [facility] Suicide Risk Assessment (Signed)
[facility] INPATIENT:  Family/Significant Other Suicide Prevention Education  Suicide Prevention Education:  Education Completed; Clenton PareKenneth Bowman, Pt's significant other [phone],  has been identified by the patient as the family member/significant other with whom the patient will be residing, and identified as the person(s) who will aid the patient in the event of a mental health crisis (suicidal ideations/suicide attempt).  With written consent from the patient, the family member/significant other has been provided the following suicide prevention education, prior to the and/or following the discharge of the patient.  The suicide prevention education provided includes the following:  Suicide risk factors  Suicide prevention and interventions  National Suicide Hotline telephone number  [facility] telephone number  [facility] Assistance 911  [facility] and/or Residential Mobile Crisis Unit telephone number  Request made of family/significant other to:  Remove weapons (e.g., guns, rifles, knives), all items previously/currently identified as safety concern.    Remove drugs/medications (over-the-counter, prescriptions, illicit drugs), all items previously/currently identified as a safety concern.  The family member/significant other verbalizes understanding of the suicide prevention education information provided.  The family member/significant other agrees to remove the items of safety concern listed above.  Elaina Hoopsarter, Mecca Barga M 09/26/2015, 4:00 PM

## 2015-09-27 MED ORDER — LURASIDONE HCL 40 MG PO TABS: 40.0000 mg | ORAL_TABLET | Freq: Every day | ORAL | Status: DC

## 2015-09-27 MED ORDER — PHENYTOIN SODIUM EXTENDED 200 MG PO CAPS: 200.0000 mg | ORAL_CAPSULE | Freq: Two times a day (BID) | ORAL | Status: DC

## 2015-09-27 MED ORDER — HYDROXYZINE HCL 25 MG PO TABS: 25.0000 mg | ORAL_TABLET | Freq: Three times a day (TID) | ORAL | Status: DC | PRN

## 2015-09-27 MED ORDER — ZOLPIDEM TARTRATE 5 MG PO TABS: 5.0000 mg | ORAL_TABLET | Freq: Every evening | ORAL | Status: DC | PRN

## 2015-09-27 MED ORDER — NICOTINE POLACRILEX 2 MG MT GUM: 2.0000 mg | CHEWING_GUM | OROMUCOSAL | Status: DC | PRN

## 2015-09-27 MED ORDER — SERTRALINE HCL 50 MG PO TABS: 50.0000 mg | ORAL_TABLET | Freq: Every day | ORAL | Status: DC

## 2015-09-27 MED ORDER — FAMOTIDINE 10 MG PO TABS: 10.0000 mg | ORAL_TABLET | Freq: Every day | ORAL | Status: DC
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
## 2015-09-27 NOTE — Tx Team (Signed)
Interdisciplinary Treatment Plan Update (Adult) Date: 09/27/2015   Date: 09/27/2015 11:17 AM  Progress in Treatment:  Attending groups: Yes  Participating in groups: Yes Taking medication as prescribed: Yes  Tolerating medication: Yes  Family/Significant othe contact made: Yes, with fiance Patient understands diagnosis: Yes Discussing patient identified problems/goals with staff: Yes  Medical problems stabilized or resolved: Yes  Denies suicidal/homicidal ideation: Yes Patient has not harmed self or Others: Yes   New problem(s) identified: None identified at this time.   Discharge Plan or Barriers: Pt will return home and follow-up with [facility] in [facility]  Additional comments:  Patient and CSW reviewed pt's identified goals and treatment plan. Patient verbalized understanding and agreed to treatment plan. CSW reviewed [facility] "Discharge Process and Patient Involvement" Form. Pt verbalized understanding of information provided and signed form.   Reason for Continuation of Hospitalization:  Depression Medication stabilization Suicidal ideation  Estimated length of stay: 0 days; Pt stable for DC  Review of initial/current patient goals per problem list:   1.  Goal(s): Patient will participate in aftercare plan  Met:  Yes  Target date: 3-5 days from date of admission   As evidenced by: Patient will participate within aftercare plan AEB aftercare provider and housing plan at discharge being identified.   09/23/15: Pt will return home and follow-up with [facility] in [facility]  2.  Goal (s): Patient will exhibit decreased depressive symptoms and suicidal ideations.  Met:  Adequate for DC  Target date: 3-5 days from date of admission   As evidenced by: Patient will utilize self rating of depression at 3 or below and demonstrate decreased signs of depression or be deemed stable for discharge by MD. 09/23/15: Pt was admitted with symptoms of depression, rating 10/10. Pt  continues to present with flat affect and depressive symptoms.  Pt will demonstrate decreased symptoms of depression and rate depression at 3/10 or lower prior to discharge. 09/27/15: MD feels that Pt's symptoms have decreased to the point that they can be managed in an outpatient setting.   Attendees:  Patient:    Family:    Physician: Dr. Parke Poisson, MD  09/27/2015 11:17 AM  Nursing: Lars Pinks, RN Case manager  09/27/2015 11:17 AM  Clinical Social Worker Peri Maris, [place] 09/27/2015 11:17 AM  Other: Tilden Fossa, LCSWA 09/27/2015 11:17 AM  Clinical: Perry Mount, RN  09/27/2015 11:17 AM  Other: , RN Charge Nurse 09/27/2015 11:17 AM  Other: Hilda Lias, [place], [place] Social Work [phone]

## 2015-09-27 NOTE — [facility] Suicide Risk Assessment (Addendum)
[facility] Discharge Suicide Risk Assessment   Demographic Factors:  30 year old female , lives with fiance, no children  Total Time spent with patient: 30 minutes  Musculoskeletal: Strength & Muscle Tone: within normal limits Gait & Station: normal Patient leans: N/A  Psychiatric Specialty Exam: Physical Exam  ROS  Blood pressure 112/67, pulse 74, temperature 98.1 F (36.7 C), temperature source Oral, resp. rate 20, height 5' (1.524 m), weight 209 lb (94.802 kg), last menstrual period 09/21/2015.Body mass index is 40.82 kg/(m^2).  General Appearance: improved grooming   Eye Contact::  Good  Speech:  Normal Rate409  Volume:  Normal  Mood:  improved mood , states mood "OK" today  Affect:  Appropriate  Thought Process:  Linear  Orientation:  Other:  fully alert and attentive   Thought Content:  denies hallucinations, no delusions expressed   Suicidal Thoughts:  No- at this time denies any suicidal ideations, denies any self injurious ideations  Homicidal Thoughts:  No- denies any violent or homicidal ideations   Memory:  recent and remote grossly intact   Judgement: improved  Insight:   Improved   Psychomotor Activity:  Normal  Concentration:  Good  Recall:  Good  Fund of Knowledge:Good  Language: Good  Akathisia:  Negative  Handed:  Right  AIMS (if indicated):     Assets:  Communication Skills Desire for Improvement Resilience  Sleep:  Number of Hours: 6.5  Cognition: WNL  ADL's:  Intact   Have you used any form of tobacco in the last 30 days? (Cigarettes, Smokeless Tobacco, Cigars, and/or Pipes): Yes  Has this patient used any form of tobacco in the last 30 days? (Cigarettes, Smokeless Tobacco, Cigars, and/or Pipes) Yes, A prescription for an FDA-approved tobacco cessation medication was offered at discharge and the patient refused  Mental Status Per Nursing Assessment::   On Admission:  Suicidal ideation indicated by patient  Current Mental Status by Physician: At this  time patient is improved compared to admission- presents alert and attentive, mood improved , today euthymic, affect more reactive, no thought disorder, no SI or HI, denies hallucinations, no delusions expressed, does not appear internally preoccupied , future oriented .  Loss Factors: Strained relationship with mother   Historical Factors: History of depression, history of prior psychiatric admissions, history of PTSD, history of cocaine dependence, sober x 10 + years  Risk Reduction Factors:   Living with another person, especially a relative, Positive social support and Positive coping skills or problem solving skills  Continued Clinical Symptoms:  As noted, patient currently improved, with improved mood, improved range of affect, no SI or HI, no psychotic symptoms.   Cognitive Features That Contribute To Risk:  No gross cognitive deficits noted upon discharge. Is alert , attentive, and oriented x 3     Suicide Risk:  Mild:  Suicidal ideation of limited frequency, intensity, duration, and specificity.  There are no identifiable plans, no associated intent, mild dysphoria and related symptoms, good self-control (both objective and subjective assessment), few other risk factors, and identifiable protective factors, including available and accessible social support.  Principal Problem: Bipolar I disorder, most recent episode depressed [facility]) Discharge Diagnoses:  Patient Active Problem List   Diagnosis Date Noted  . Bipolar affective disorder, currently depressed, moderate (HCC) [F31.32] 09/22/2015  . Post traumatic stress disorder (PTSD) [F43.10] 09/22/2015  . Severe episode of recurrent major depressive disorder, without psychotic features (HCC) [F33.2]   . Bipolar I disorder, most recent episode depressed (HCC) [F31.30] 08/12/2015  .  MDD (major depressive disorder) (HCC) [F32.9] 08/11/2015  . Seizure disorder (HCC) [G40.909]   . Bipolar 1 disorder, mixed, moderate (HCC) [F31.62]   .  Intentional overdose of drug in tablet form (HCC) [T50.902A] 08/06/2015  . Overdose [T50.901A] 08/06/2015  . Suicidal intent [R45.851] 08/06/2015  . Cervicitis [N72] 03/31/2013  . Chronic constipation [K59.00] 11/12/2012  . Chronic lower back pain [M54.5, G89.29] 11/12/2012  . Hot flashes [N95.1] 11/12/2012  . Kidney stones [N20.0]   . Anemia [D64.9]     Follow-up Information    Follow up with [facility].   Why:  Please walk-in between 8am-3pm Monday-Friday within 7 days of your discharge to have your initial assessment for medication management and therapy completed.   Contact information:   [address] [place], [place] [address] [phone] Fax:       Plan Of Care/Follow-up recommendations:  Activity:  as tolerated  Diet:  Regular Tests:  NA Other:  See below   Is patient on multiple antipsychotic therapies at discharge:  No   Has Patient had three or more failed trials of antipsychotic monotherapy by history:  No  Recommended Plan for Multiple Antipsychotic Therapies: NA  Patient is leaving unit in good spirits Plans to return home- lives with fiance Plans to follow up as above  Plans to follow up with her PCP, Dr. Elise Benne , for management of medical issues/history of seizure disorder   COBOS, FERNANDO 09/27/2015, 11:24 AM

## 2015-09-27 NOTE — Progress Notes (Signed)
  [facility] Adult Case Management Discharge Plan :  Will you be returning to the same living situation after discharge:  Yes,  Pt returning home At discharge, do you have transportation home?: Yes,  Pt given PART bus fare and [place] bus pass Do you have the ability to pay for your medications: Yes,  Pt provided with prescriptions  Release of information consent forms completed and in the chart;  Patient's signature needed at discharge.  Patient to Follow up at: Follow-up Information    Follow up with [facility].   Why:  Please walk-in between 8am-3pm Monday-Friday within 7 days of your discharge to have your initial assessment for medication management and therapy completed.   Contact information:   [address] [place], [place] [address] [phone] Fax:       Next level of care provider has access to [facility] Link:no  Safety Planning and Suicide Prevention discussed: Yes,  with fiance; see SPE note for further details  Have you used any form of tobacco in the last 30 days? (Cigarettes, Smokeless Tobacco, Cigars, and/or Pipes): Yes  Has patient been referred to the Quitline?: Patient refused referral  Patient has been referred for addiction treatment: N/A  Elaina Hoops 09/27/2015, 11:20 AM

## 2015-09-27 NOTE — Plan of Care (Signed)
Problem: Diagnosis: Increased Risk For Suicide Attempt Goal: STG-Patient Will Attend All Groups On The Unit Outcome: Progressing Pt attended evening wrap up group     

## 2015-09-27 NOTE — Progress Notes (Signed)
D: Patient in dayroom on approach. Pt reports plans is to move forward with family and not dwell on the past. Pt denies SI/HI/AVH and pain. Pt attended and participated in evening wrap up group. Cooperative with assessment.  A: Met with pt 1:1. Medications administered as prescribed. Support and encouragement provided to attend groups and engage in milieu.  R: Patient remains safe and complaint with medications.

## 2015-09-27 NOTE — Progress Notes (Signed)
Patient ID: Paula Little, female   DOB: [birth year], 30 y.o.   MRN: [id number] Patient was given discharge aftercare instructions and acknowledged understanding of instructions.  Patient also stated that she received all belongings upon discharge.  Patient was pleasant upon discharge and stated that she would be going to her fiance's house in [place] by bus.

## 2015-11-03 ENCOUNTER — Emergency Department (HOSPITAL_COMMUNITY)
Admission: EM | Admit: 2015-11-03 | Discharge: 2015-11-04 | Disposition: A | Discharge status: Other Acute Inpatient Hospital | Payer: Medicare Other | Attending: Emergency Medicine | Admitting: Emergency Medicine

## 2015-11-03 ENCOUNTER — Encounter (HOSPITAL_COMMUNITY): Payer: Self-pay | Admitting: *Deleted

## 2015-11-03 DIAGNOSIS — Z915 Personal history of self-harm: Secondary | ICD-10-CM | POA: Diagnosis not present

## 2015-11-03 DIAGNOSIS — F3132 Bipolar disorder, current episode depressed, moderate: Secondary | ICD-10-CM

## 2015-11-03 DIAGNOSIS — Z79899 Other long term (current) drug therapy: Secondary | ICD-10-CM | POA: Insufficient documentation

## 2015-11-03 DIAGNOSIS — R45851 Suicidal ideations: Secondary | ICD-10-CM | POA: Diagnosis present

## 2015-11-03 DIAGNOSIS — Z91040 Latex allergy status: Secondary | ICD-10-CM | POA: Insufficient documentation

## 2015-11-03 DIAGNOSIS — Z87442 Personal history of urinary calculi: Secondary | ICD-10-CM | POA: Diagnosis not present

## 2015-11-03 DIAGNOSIS — F419 Anxiety disorder, unspecified: Secondary | ICD-10-CM | POA: Insufficient documentation

## 2015-11-03 DIAGNOSIS — G8929 Other chronic pain: Secondary | ICD-10-CM | POA: Diagnosis not present

## 2015-11-03 DIAGNOSIS — Z880 Allergy status to penicillin: Secondary | ICD-10-CM | POA: Insufficient documentation

## 2015-11-03 DIAGNOSIS — R4589 Other symptoms and signs involving emotional state: Secondary | ICD-10-CM

## 2015-11-03 DIAGNOSIS — Z3202 Encounter for pregnancy test, result negative: Secondary | ICD-10-CM | POA: Diagnosis not present

## 2015-11-03 DIAGNOSIS — F17210 Nicotine dependence, cigarettes, uncomplicated: Secondary | ICD-10-CM | POA: Diagnosis not present

## 2015-11-03 DIAGNOSIS — R4689 Other symptoms and signs involving appearance and behavior: Secondary | ICD-10-CM

## 2015-11-03 LAB — COMPREHENSIVE METABOLIC PANEL
ALT: 21 U/L (ref 14–54)
AST: 24 U/L (ref 15–41)
Albumin: 3.9 g/dL (ref 3.5–5.0)
Alkaline Phosphatase: 99 U/L (ref 38–126)
Anion gap: 10 (ref 5–15)
BUN: 8 mg/dL (ref 6–20)
CHLORIDE: 104 mmol/L (ref 101–111)
CO2: 25 mmol/L (ref 22–32)
Calcium: 9.7 mg/dL (ref 8.9–10.3)
Creatinine, Ser: 0.75 mg/dL (ref 0.44–1.00)
GFR calc Af Amer: >60 mL/min (ref >60)
Glucose, Bld: 88 mg/dL (ref 65–99)
POTASSIUM: 4 mmol/L (ref 3.5–5.1)
Sodium: 139 mmol/L (ref 135–145)
Total Bilirubin: <0.1 mg/dL — ABNORMAL LOW (ref 0.3–1.2)
Total Protein: 7 g/dL (ref 6.5–8.1)

## 2015-11-03 LAB — CBC
HCT: 37 % (ref 36.0–46.0)
Hemoglobin: 12.6 g/dL (ref 12.0–15.0)
MCH: 30.9 pg (ref 26.0–34.0)
MCHC: 34.1 g/dL (ref 30.0–36.0)
MCV: 90.7 fL (ref 78.0–100.0)
PLATELETS: 214 10*3/uL (ref 150–400)
RBC: 4.08 MIL/uL (ref 3.87–5.11)
RDW: 13 % (ref 11.5–15.5)
WBC: 5.5 10*3/uL (ref 4.0–10.5)

## 2015-11-03 LAB — ACETAMINOPHEN LEVEL

## 2015-11-03 LAB — RAPID URINE DRUG SCREEN, HOSP PERFORMED
AMPHETAMINES: NOT DETECTED
BENZODIAZEPINES: NOT DETECTED
Barbiturates: NOT DETECTED
Cocaine: NOT DETECTED
OPIATES: NOT DETECTED
Tetrahydrocannabinol: NOT DETECTED

## 2015-11-03 LAB — ETHANOL

## 2015-11-03 LAB — I-STAT BETA HCG BLOOD, ED (MC, WL, AP ONLY): I-stat hCG, quantitative: <5 m[IU]/mL (ref <5)

## 2015-11-03 LAB — SALICYLATE LEVEL

## 2015-11-03 LAB — PHENYTOIN LEVEL, TOTAL: Phenytoin Lvl: 7.3 ug/mL — ABNORMAL LOW (ref 10.0–20.0)

## 2015-11-03 MED ORDER — LORAZEPAM 1 MG PO TABS: 1.0000 mg | ORAL_TABLET | Freq: Three times a day (TID) | ORAL | Status: DC | PRN

## 2015-11-03 MED ORDER — ALUM & MAG HYDROXIDE-SIMETH 200-200-20 MG/5ML PO SUSP: 30.0000 mL | ORAL | Status: DC | PRN

## 2015-11-03 MED ORDER — RISPERIDONE 0.5 MG PO TABS
2.0000 mg | ORAL_TABLET | Freq: Every day | ORAL | Status: DC
  Administered 2015-11-04: 2 mg via ORAL
  Filled 2015-11-03: qty 4

## 2015-11-03 MED ORDER — ZOLPIDEM TARTRATE 5 MG PO TABS: 5.0000 mg | ORAL_TABLET | Freq: Every evening | ORAL | Status: DC | PRN

## 2015-11-03 MED ORDER — ACETAMINOPHEN 325 MG PO TABS: 650.0000 mg | ORAL_TABLET | ORAL | Status: DC | PRN

## 2015-11-03 MED ORDER — PHENYTOIN SODIUM EXTENDED 100 MG PO CAPS
200.0000 mg | ORAL_CAPSULE | Freq: Two times a day (BID) | ORAL | Status: DC
  Administered 2015-11-04 (×2): 200 mg via ORAL
  Filled 2015-11-03 (×2): qty 2
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
## 2015-11-03 NOTE — ED Notes (Signed)
Patient was given meal, tolerated well, denies N/V. Patient now walking around Pod C with her sitter to "shake off some anxiety." Patient cooperative, calm, and in NAD.

## 2015-11-03 NOTE — ED Notes (Signed)
TTS machine placed in room. BH to call within next few minutes to do assessment.

## 2015-11-03 NOTE — ED Notes (Signed)
[facility] called in regards to patient. Update given to them - pt is A&O x 4, cooperative, and calm.
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
## 2015-11-04 ENCOUNTER — Inpatient Hospital Stay (HOSPITAL_COMMUNITY)
Admission: AD | Admit: 2015-11-04 | Discharge: 2015-11-10 | DRG: 885 | Disposition: A | Discharge status: Home / Self Care | Payer: Medicare Other | Source: Intra-hospital | Attending: Psychiatry | Admitting: Psychiatry

## 2015-11-04 DIAGNOSIS — Z62810 Personal history of physical and sexual abuse in childhood: Secondary | ICD-10-CM | POA: Diagnosis present

## 2015-11-04 DIAGNOSIS — F4310 Post-traumatic stress disorder, unspecified: Secondary | ICD-10-CM | POA: Diagnosis present

## 2015-11-04 DIAGNOSIS — F17210 Nicotine dependence, cigarettes, uncomplicated: Secondary | ICD-10-CM | POA: Diagnosis present

## 2015-11-04 DIAGNOSIS — Z818 Family history of other mental and behavioral disorders: Secondary | ICD-10-CM

## 2015-11-04 DIAGNOSIS — F332 Major depressive disorder, recurrent severe without psychotic features: Secondary | ICD-10-CM | POA: Diagnosis present

## 2015-11-04 DIAGNOSIS — Z23 Encounter for immunization: Secondary | ICD-10-CM

## 2015-11-04 DIAGNOSIS — G40909 Epilepsy, unspecified, not intractable, without status epilepticus: Secondary | ICD-10-CM | POA: Diagnosis present

## 2015-11-04 DIAGNOSIS — F251 Schizoaffective disorder, depressive type: Principal | ICD-10-CM | POA: Diagnosis present

## 2015-11-04 DIAGNOSIS — G4700 Insomnia, unspecified: Secondary | ICD-10-CM | POA: Diagnosis present

## 2015-11-04 DIAGNOSIS — F411 Generalized anxiety disorder: Secondary | ICD-10-CM | POA: Diagnosis present

## 2015-11-04 DIAGNOSIS — F3132 Bipolar disorder, current episode depressed, moderate: Secondary | ICD-10-CM | POA: Diagnosis not present

## 2015-11-04 DIAGNOSIS — R45851 Suicidal ideations: Secondary | ICD-10-CM | POA: Diagnosis not present

## 2015-11-04 HISTORY — DX: Bipolar disorder, unspecified: F31.9

## 2015-11-04 HISTORY — DX: Anxiety disorder, unspecified: F41.9

## 2015-11-04 LAB — PHENYTOIN LEVEL, TOTAL
PHENYTOIN LVL: 9.7 ug/mL — AB (ref 10.0–20.0)
Phenytoin Lvl: 11.2 ug/mL (ref 10.0–20.0)

## 2015-11-04 MED ORDER — ACETAMINOPHEN 325 MG PO TABS: 650.0000 mg | ORAL_TABLET | Freq: Four times a day (QID) | ORAL | Status: DC | PRN

## 2015-11-04 MED ORDER — RISPERIDONE 2 MG PO TABS
2.0000 mg | ORAL_TABLET | Freq: Every day | ORAL | Status: DC
  Administered 2015-11-05: 2 mg via ORAL
  Filled 2015-11-04 (×4): qty 1

## 2015-11-04 MED ORDER — MAGNESIUM HYDROXIDE 400 MG/5ML PO SUSP: 30.0000 mL | Freq: Every day | ORAL | Status: DC | PRN

## 2015-11-04 MED ORDER — ZOLPIDEM TARTRATE 5 MG PO TABS
5.0000 mg | ORAL_TABLET | Freq: Every evening | ORAL | Status: DC | PRN
  Administered 2015-11-05: 5 mg via ORAL
  Filled 2015-11-04: qty 1

## 2015-11-04 NOTE — ED Notes (Signed)
Poison control called again for another update - patient's dilantin level up to 9.7. Patient sleeping, easily aroused, no complaints or pain. Poison control states to get another repeat dilantin level around 1400.

## 2015-11-04 NOTE — ED Notes (Signed)
Patient CBG was 76, the Nurse was informed.

## 2015-11-04 NOTE — Progress Notes (Signed)
Patient ID: Paula Little, female   DOB: [birth year], 30 y.o.   MRN: [id number] Per State regulations 482.30 this chart was reviewed for medical necessity with respect to the patient's admission/duration of stay.    Next review date: 11/08/15  Thurman Coyer, BSN, RN-BC  Case Manager

## 2015-11-04 NOTE — ED Notes (Signed)
Patient was given a snack and drink, and a regular diet ordered for lunch. 

## 2015-11-05 ENCOUNTER — Encounter (HOSPITAL_COMMUNITY): Payer: Self-pay

## 2015-11-05 DIAGNOSIS — F251 Schizoaffective disorder, depressive type: Secondary | ICD-10-CM | POA: Diagnosis not present

## 2015-11-05 DIAGNOSIS — F3132 Bipolar disorder, current episode depressed, moderate: Secondary | ICD-10-CM

## 2015-11-05 DIAGNOSIS — R45851 Suicidal ideations: Secondary | ICD-10-CM

## 2015-11-05 LAB — CBG MONITORING, ED: Glucose-Capillary: 76 mg/dL (ref 65–99)

## 2015-11-05 MED ORDER — HYDROXYZINE HCL 25 MG PO TABS
25.0000 mg | ORAL_TABLET | Freq: Four times a day (QID) | ORAL | Status: DC | PRN
  Administered 2015-11-05 – 2015-11-06 (×2): 25 mg via ORAL
  Filled 2015-11-05 (×2): qty 1

## 2015-11-05 MED ORDER — RISPERIDONE 2 MG PO TABS
2.0000 mg | ORAL_TABLET | Freq: Two times a day (BID) | ORAL | Status: DC
  Administered 2015-11-05 – 2015-11-07 (×4): 2 mg via ORAL
  Filled 2015-11-05 (×9): qty 1

## 2015-11-05 MED ORDER — PNEUMOCOCCAL VAC POLYVALENT 25 MCG/0.5ML IJ INJ: 0.5000 mL | INJECTION | INTRAMUSCULAR | Status: DC

## 2015-11-05 MED ORDER — PNEUMOCOCCAL VAC POLYVALENT 25 MCG/0.5ML IJ INJ
0.5000 mL | INJECTION | Freq: Once | INTRAMUSCULAR | Status: AC
  Administered 2015-11-05: 0.5 mL via INTRAMUSCULAR
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
## 2015-11-05 NOTE — Progress Notes (Signed)
Did not attend group 
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
## 2015-11-05 NOTE — [facility] Group Notes (Signed)
[facility] Group Notes:  (Nursing--Healthy Coping Skills)  Date:  11/05/2015  Time: 1330 Type of Therapy:  Nurse Education  Participation Level:  Minimal  Participation Quality:  Appropriate and Attentive  Affect:  Blunted  Cognitive:  Alert, Orietned  Insight:  Appropriate  Engagement in Group:  Limited  Modes of Intervention:  Discussion, Education and Support  Summary of Progress/Problems: Pt attended groups as scheduled and was engaged.   Ouida Sills, Lincoln Maxin 11/05/2015, 1330

## 2015-11-05 NOTE — Progress Notes (Signed)
D: Pt A & O X 3. Presents with blunt affect and anxious mood. Visible in milieu at intervals during shift. Attended scheduled groups. Compliant with ordered medications. Denies SI, HI, AVH and pain when assessed. A: Scheduled and PRN medications (Vistaril) administered as per MD's orders with verbal education. Writer provided emotional support and availability to pt throughout this shift. Verbal encouragement offered towards treatment compliance including group attendance. Writer informed pt of new orders. Q 15 minutes checks maintained for safety as ordered without behavioral outburst or self harm activities to note thus far. R: Pt remains safe on and off unit. POC continues.

## 2015-11-05 NOTE — Tx Team (Signed)
Initial Interdisciplinary Treatment Plan   PATIENT STRESSORS: Financial difficulties Marital or family conflict Medication change or noncompliance   PATIENT STRENGTHS: Capable of independent living Communication skills Supportive family/friends   PROBLEM LIST: Problem List/Patient Goals Date to be addressed Date deferred Reason deferred Estimated date of resolution  "Stay alive" 11/04/2015     "Stay on my medications" 11/04/2015     depression 11/04/2015     Risk for suicide 11/04/2015     Anxiety 11/04/2015                              DISCHARGE CRITERIA:  Medical problems require only outpatient monitoring Safe-care adequate arrangements made Verbal commitment to aftercare and medication compliance  PRELIMINARY DISCHARGE PLAN: Return to previous living arrangement  PATIENT/FAMIILY INVOLVEMENT: This treatment plan has been presented to and reviewed with the patient, Paula Little, and/or family member.  The patient and family have been given the opportunity to ask questions and make suggestions.  Paula Little T Charlis Harner 11/05/2015, 3:28 AM

## 2015-11-05 NOTE — Plan of Care (Signed)
Problem: Alteration in mood Goal: STG-Patient reports thoughts of self-harm to staff Outcome: Progressing Pt denies SI. Verbally contracts for safety. No gestures or event of self injurious behavior to report at this time.   Problem: Ineffective individual coping Goal: STG: Patient will remain free from self harm Outcome: Progressing Pt maintained on Q 15 minutes checks for safety without self injurious behavior to note thus far this shift.

## 2015-11-05 NOTE — Progress Notes (Signed)
Admission Note  Pt is a 30 y/o AA female admitted onto the 500 I/P adult unit. Pt who just left about 1 month ago endorses severe anxiety and depression; she states, "I don't want to continue living like this; I need help." Pt stated goals are "stay alive" and "stay on my medications." Support, encouragement, and safe environment provided.  15-minute safety checks initiated and continued. Pt remained calm and cooperative through the admission process.
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
## 2015-11-05 NOTE — Progress Notes (Signed)
Patient has been asleep the majority of the shift and had to be awakened in order to give her scheduled medications. She came to medication window and reported that her day has been ok but she is still feeling depressed. She was compliant with her medications and received a snack and returned to her room to lie back down. She denies si/hi/a/v hallucinations. Safety maintained on unit with 15 min checks.

## 2015-11-06 MED ORDER — NEBIVOLOL HCL 2.5 MG PO TABS
2.5000 mg | ORAL_TABLET | Freq: Two times a day (BID) | ORAL | Status: DC | PRN
  Filled 2015-11-06: qty 1

## 2015-11-06 MED ORDER — PHENYTOIN SODIUM EXTENDED 100 MG PO CAPS
200.0000 mg | ORAL_CAPSULE | Freq: Two times a day (BID) | ORAL | Status: DC
  Filled 2015-11-06 (×2): qty 2

## 2015-11-06 MED ORDER — PHENYTOIN SODIUM EXTENDED 100 MG PO CAPS
200.0000 mg | ORAL_CAPSULE | Freq: Two times a day (BID) | ORAL | Status: DC
  Administered 2015-11-06 – 2015-11-10 (×9): 200 mg via ORAL
  Filled 2015-11-06 (×14): qty 2

## 2015-11-06 MED ORDER — ZOLPIDEM TARTRATE 10 MG PO TABS
10.0000 mg | ORAL_TABLET | Freq: Every evening | ORAL | Status: DC | PRN
  Administered 2015-11-06 – 2015-11-09 (×4): 10 mg via ORAL
  Filled 2015-11-06 (×4): qty 1

## 2015-11-06 NOTE — [facility] Suicide Risk Assessment (Signed)
[facility] INPATIENT:  Family/Significant Other Suicide Prevention Education  Suicide Prevention Education:  Education Completed; Patient's significant other, Paula Little, at [phone] has been identified by the patient as the family member/significant other with whom the patient will be residing, and identified as the person(s) who will aid the patient in the event of a mental health crisis (suicidal ideations/suicide attempt).  With written consent from the patient, the family member/significant other has been provided the following suicide prevention education, prior to the and/or following the discharge of the patient.  The suicide prevention education provided includes the following:  Suicide risk factors  Suicide prevention and interventions  National Suicide Hotline telephone number  [facility] assessment telephone number  [facility] Emergency Assistance 911  [facility] and/or Residential Mobile Crisis Unit telephone number  Request made of family/significant other to:  Remove weapons (e.g., guns, rifles, knives), all items previously/currently identified as safety concern.    Remove drugs/medications (over-the-counter, prescriptions, illicit drugs), all items previously/currently identified as a safety concern.  The family member/significant other verbalizes understanding of the suicide prevention education information provided.  The family member/significant other agrees to remove the items of safety concern listed above.  Clide Dales 11/06/2015, 4:57 PM
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
## 2015-11-06 NOTE — Progress Notes (Signed)
Patient in bed sleeping for most part of the evening. She walked up to the window when she woke up. She reported that she had off and on day but feeling better after waking up. Denied suicide thoughts and denied Hallucinations during the assessment; "I had those earlier but none at the moment". Writer encouraged and supported patient. Q 15 minute check continues as ordered for safety.

## 2015-11-06 NOTE — [facility] Group Notes (Signed)
[facility] Group Notes:  (Nursing/MHT/Case Management/Adjunct)  Date:  11/06/2015  Time: 0930  Type of Therapy:  Nurse Education  Participation Level:  Did Not Attend    Dara Hoyer 11/06/2015, 12:58 PM

## 2015-11-06 NOTE — [facility] Group Notes (Signed)
[facility] LCSW Group Therapy  11/06/2015   11:00 AM   Type of Therapy:  Group Therapy  Participation Level:  Minimal  Participation Quality:  Appropriate  Affect:  Flat  Cognitive:  Alert and Appropriate  Insight:  Developing/Improving and Engaged  Engagement in Therapy:  Developing/Improving and Engaged  Modes of Intervention:  Clarification, Confrontation, Discussion, Education, Exploration, Limit-setting, Orientation, Problem-solving, Rapport Building, Dance movement psychotherapist, Socialization and Support  Summary of Progress/Problems: The main focus of today's process group was to identify the patient's current support system and decide on other supports that can be put in place.  An emphasis was placed on using counselor, doctor, therapy groups, 12-step groups, and problem-specific support groups to expand supports, as well as doing something different than has been done before. Pt shared that her family is very negative and she has already set boundaries by not talking to them when she knows they will upset her.  Pt states that her boyfriend is a positive support.  Pt was observed lying down in her chair throughout group and was minimally engaged after sharing.    Reyes Ivan, LCSW 11/06/2015 2:23 PM

## 2015-11-06 NOTE — Progress Notes (Addendum)
D:Per patient self inventory form pt reports she slept fair last night with the use of sleep medication. Pt reports a fair appetite, normal energy level, ok concentration. Pt rates depression 7/10, hopelessness 6/10, anxiety 6/10- all on 0-10 scale, 10 being the worse. Pt reports passive SI, but can verbally contract for safety. Pt denies plan to hurt self in hospital and reports she feels safe. Pt reports her goal is "to get some energy/stay on my meds, try to go to all groups." Pt reports she will meet her goal by "try to stay positive." Pt denies HI,Pt denies AVH.  A:Special checks q 15 mins in place for safety. Medication administered per MD order (see eMAR) Encouragement and support provided.  R:Safety maintained. Compliant with medication regimen. Did not attend nursing group on the unit. Will continue to monitor.

## 2015-11-07 DIAGNOSIS — F4310 Post-traumatic stress disorder, unspecified: Secondary | ICD-10-CM

## 2015-11-07 DIAGNOSIS — F251 Schizoaffective disorder, depressive type: Secondary | ICD-10-CM | POA: Diagnosis present

## 2015-11-07 LAB — PHENYTOIN LEVEL, FREE AND TOTAL
PHENYTOIN, TOTAL: 10 ug/mL (ref 10.0–20.0)
Phenytoin, Free: 0.7 ug/mL — ABNORMAL LOW (ref 1.0–2.0)

## 2015-11-07 MED ORDER — RISPERIDONE 2 MG PO TABS
5.0000 mg | ORAL_TABLET | Freq: Every day | ORAL | Status: DC
  Administered 2015-11-08 – 2015-11-09 (×2): 5 mg via ORAL
  Filled 2015-11-07: qty 2.5
  Filled 2015-11-07 (×2): qty 3
  Filled 2015-11-07 (×3): qty 2.5

## 2015-11-07 MED ORDER — BENZTROPINE MESYLATE 0.5 MG PO TABS: 0.5000 mg | ORAL_TABLET | ORAL | Status: DC

## 2015-11-07 MED ORDER — RISPERIDONE 2 MG PO TABS: 2.5000 mg | ORAL_TABLET | ORAL | Status: DC

## 2015-11-07 MED ORDER — BENZTROPINE MESYLATE 0.5 MG PO TABS
0.5000 mg | ORAL_TABLET | Freq: Every day | ORAL | Status: DC
  Administered 2015-11-07 – 2015-11-09 (×3): 0.5 mg via ORAL
  Filled 2015-11-07 (×5): qty 1

## 2015-11-07 MED ORDER — OXCARBAZEPINE 150 MG PO TABS
150.0000 mg | ORAL_TABLET | Freq: Two times a day (BID) | ORAL | Status: DC
  Administered 2015-11-07 – 2015-11-10 (×6): 150 mg via ORAL
  Filled 2015-11-07 (×12): qty 1

## 2015-11-07 MED ORDER — RISPERIDONE 2 MG PO TABS: 2.5000 mg | ORAL_TABLET | Freq: Every day | ORAL | Status: DC

## 2015-11-07 MED ORDER — RISPERIDONE 1 MG PO TABS
2.5000 mg | ORAL_TABLET | Freq: Every day | ORAL | Status: AC
  Administered 2015-11-07: 2.5 mg via ORAL
  Filled 2015-11-07: qty 2.5

## 2015-11-07 MED ORDER — RISPERIDONE 2 MG PO TABS: 2.5000 mg | ORAL_TABLET | Freq: Two times a day (BID) | ORAL | Status: DC

## 2015-11-07 NOTE — Progress Notes (Signed)
Adult Psychoeducational Group Note  Date:  11/07/2015 Time:  9:13 PM  Group Topic/Focus:  Wrap-Up Group:   The focus of this group is to help patients review their daily goal of treatment and discuss progress on daily workbooks.  Participation Level:  Active  Participation Quality:  Appropriate  Affect:  Appropriate  Cognitive:  Appropriate  Insight: Appropriate  Engagement in Group:  Engaged  Modes of Intervention:  Discussion  Additional Comments:The patient expressed that she attended group.  Octavio Manns 11/07/2015, 9:13 PM
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
## 2015-11-07 NOTE — [facility] Group Notes (Signed)
[facility] LCSW Aftercare Discharge Planning Group Note  11/07/2015 8:45 AM  Participation Quality: Alert, Appropriate and Oriented  Mood/Affect: Flat  Depression Rating: 7  Anxiety Rating: 5  Thoughts of Suicide: Pt denies SI/HI  Will you contract for safety? Yes  Current AVH: Pt denies  Plan for Discharge/Comments: Pt attended discharge planning group and actively participated in group. CSW discussed suicide prevention education with the group and encouraged them to discuss discharge planning and any relevant barriers. Pt expresses that she is feeling "okay" this morning. She is hoping to discharge in  2-3 days.   Transportation Means: Pt reports access to transportation  Supports: No supports mentioned at this time  Chad Cordial, LCSWA 11/07/2015 2:08 PM

## 2015-11-07 NOTE — Progress Notes (Signed)
DAR Note: Tomorrow has been up and down the unit today.  Interacting with peers.  She states that she is feeling better but is still depressed and anxious.  "I need something for my PTSD."  She denies SI/HI or A/V hallucinations today.  She denies any pain or discomfort and appears to be in no physical distress.  She has been attending groups.  Conversation appropriate and goal directed.  She was very inquisitive about her medications and accepted medication education on her new medications today.  She completed her self inventory and reports that her depression is 7/10, hopelessness 5/10 and her anxiety is 6/10.  Her goal for today is "stay positive" and she will accomplish this goal by "try to not let negative get in my way."  Encouraged continued participation in group and unit activities.  Q 15 minute checks maintained for safety.  We will continue to monitor the progress towards her goals.

## 2015-11-08 NOTE — Tx Team (Signed)
Interdisciplinary Treatment Plan Update (Adult) Date: 11/08/2015   Date: 11/08/2015 10:16 AM  Progress in Treatment:  Attending groups: Yes  Participating in groups: Yes Taking medication as prescribed: Yes  Tolerating medication: Yes  Family/Significant othe contact made: Yes, with fiance Patient understands diagnosis: Yes Discussing patient identified problems/goals with staff: Yes  Medical problems stabilized or resolved: Yes  Denies suicidal/homicidal ideation: Yes Patient has not harmed self or Others: Yes   New problem(s) identified: None identified at this time.   Discharge Plan or Barriers: Pt will return home and follow-up with [facility]  Additional comments:  Patient and CSW reviewed pt's identified goals and treatment plan. Patient verbalized understanding and agreed to treatment plan. CSW reviewed [facility] "Discharge Process and Patient Involvement" Form. Pt verbalized understanding of information provided and signed form.   Reason for Continuation of Hospitalization:  Depression Medication stabilization Suicidal ideation  Estimated length of stay: 1-3 days  Review of initial/current patient goals per problem list:   1.  Goal(s): Patient will participate in aftercare plan  Met:  Yes  Target date: 3-5 days from date of admission   As evidenced by: Patient will participate within aftercare plan AEB aftercare provider and housing plan at discharge being identified.   11/08/15: Pt will return home and follow-up with [facility] in [facility]  2.  Goal (s): Patient will exhibit decreased depressive symptoms and suicidal ideations.  Met:  No  Target date: 3-5 days from date of admission   As evidenced by: Patient will utilize self rating of depression at 3 or below and demonstrate decreased signs of depression or be deemed stable for discharge by MD. 11/08/15: Pt rates depression at 7/10; denies SI  Attendees:  Patient:    Family:    Physician: Dr.  Shea Evans, MD  11/08/2015 10:16 AM  Nursing: Manuella Ghazi, RN; Larrie Kass., RN  11/08/2015 10:16 AM  Clinical Social Worker Peri Maris, [place] 11/08/2015 10:16 AM  Other:  11/08/2015 10:16 AM  Clinical: 11/08/2015 10:16 AM  Other: , RN Charge Nurse 11/08/2015 10:16 AM  Other:     Peri Maris, [place] Work [phone]

## 2015-11-08 NOTE — [facility] Group Notes (Signed)
[facility] LCSW Group Therapy 11/08/2015 1:15pm  Type of Therapy: Group Therapy- Feelings Around Discharge & Establishing a Supportive Framework  Participation Level: Active   Modes of Intervention: Clarification, Confrontation, Discussion, Education, Exploration, Limit-setting, Orientation, Problem-solving, Rapport Building, Dance movement psychotherapist, Socialization and Support   Description of Group:   What is a supportive framework? What does it look like feel like and how do I discern it from and unhealthy non-supportive network? Learn how to cope when supports are not helpful and don't support you. Discuss what to do when your family/friends are not supportive.  Summary of Patient Progress Pt discussed the concept of community, especially her difficulty engaging in healthy community. Pt discussed her aversion to crowds and expressed that her community is small. She reports trust issues with close family members such as her mother and her difficulty forgiving those in her community for past actions.   Therapeutic Modalities:   Cognitive Behavioral Therapy Person-Centered Therapy Motivational Interviewing   Chad Cordial, LCSWA 11/08/2015 1:58 PM

## 2015-11-08 NOTE — Progress Notes (Signed)
Patient ID: Paula Little, female   DOB: 30-Aug-1986, 30 y.o.   MRN: [id number] PER STATE REGULATIONS 482.30  THIS CHART WAS REVIEWED FOR MEDICAL NECESSITY WITH RESPECT TO THE PATIENT'S ADMISSION/ DURATION OF STAY.  NEXT REVIEW DATE: 11/12/2015  Willa Rough, RN, BSN CASE MANAGER'

## 2015-11-08 NOTE — Progress Notes (Signed)
DAR Note: Paula Little has been up and visible on the unit.  She states that she is a little tired today but she got up for groups.  She is interacting with peers.  Appetite good.  She denies SI/HI or A/V hallucinations.  She reports that she has been having some pain in her feet due to an injury that happened over a year ago.  She is requesting for that to be addressed.  Urged her to let the doctor know about the issues.  No pain medications given.  She completed her self inventory and reports that her depression is 5/10, hopelessness 1/10 and her anxiety is 6/10.  She states that her goal for today is "staying positive" and she will accomplish this goal by "don't let negative things get in my way."  Encouraged continued participation in group and unit activities.  Q 15 minute checks maintained for safety.  We will continue to monitor the progress towards her goals.

## 2015-11-08 NOTE — Progress Notes (Signed)
Adult Psychoeducational Group Note  Date:  11/08/2015 Time:  9:14 PM  Group Topic/Focus:  Wrap-Up Group:   The focus of this group is to help patients review their daily goal of treatment and discuss progress on daily workbooks.  Participation Level:  Active  Participation Quality:  Appropriate  Affect:  Appropriate  Cognitive:  Appropriate  Insight: Appropriate  Engagement in Group:  Engaged  Modes of Intervention:  Discussion  Additional Comments:  The patient expressed that she attended group.The patient also said that group was about community.  Octavio Manns 11/08/2015, 9:14 PM

## 2015-11-08 NOTE — [facility] Group Notes (Signed)
[facility] Group Notes:  (Nursing/MHT/Case Management/Adjunct)  Date:  11/08/2015  Time:  9:30am  Type of Therapy:  Nurse Education  Participation Level:  Active  Participation Quality:  Attentive and Sharing  Affect:  Appropriate  Cognitive:  Alert  Insight:  Limited  Engagement in Group:  Improving and Supportive  Modes of Intervention:  Discussion and Education  Summary of Progress/Problems:  Group topic today was recovery.  Discussed what recovery means to the group.  Discussed long term and short term goals.  Reviewed sleep hygiene.  She was able to talk about follow up care and how she likes to joke with family members because it helps her laugh.
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
## 2015-11-09 NOTE — Progress Notes (Signed)
D:Pt presents with flat affect and depressed mood. Pt rates depression 6/10. Anxiety 6/10. Hopeless 3/10. Pt denies AVH. Pt denies suicidal thoughts and verbally contracts for safety. Pt reports good sleep and appetite. Pt hygiene appropriate for pt and situation. Pt c/o right ankle and foot pain. Writer offered pt tylenol for discomfort and pt refused. Pt requesting a stronger pain med. Dr. Elna Breslow made aware of pt request. Pt compliant with taking meds. No adverse reactions to meds verbalized by pt.  A:Medications reviewed with pt. Medications administered as ordered per MD. Verbal support provided. Pt encouraged to attend groups. 15 minute checks performed for safety. R: Pt verbalized understanding of med regimen. Pt stated goal "stay positive". Pt receptive to tx.

## 2015-11-09 NOTE — Progress Notes (Signed)
Adult Psychoeducational Group Note  Date:  11/09/2015 Time:  9:29 PM  Group Topic/Focus:  Wrap-Up Group:   The focus of this group is to help patients review their daily goal of treatment and discuss progress on daily workbooks.  Participation Level:  Active  Participation Quality:  Appropriate  Affect:  Appropriate  Cognitive:  Alert  Insight: Appropriate  Engagement in Group:  Engaged  Modes of Intervention:  Discussion  Additional Comments:  Patient goal for today was to stay positive. Patient stated she met her goal. On a scale between 1-10, (1=worse, 10=best) patient rated her day a 9 because "hopefully I get to discharge tomorrow".   Emma Schupp L Diara Chaudhari 11/09/2015, 9:29 PM

## 2015-11-09 NOTE — Progress Notes (Signed)
D: Patient alert and oriented x 4. Patient denies pain/SI/HI/AVH at time of assessment. Patient reports she was having a good day until her boyfriend started asking questions about her ID card. Patient states she has been waiting on ID card to come in teh mail at a friends house but it has not. Patient states boyfriend is wanting her to have before they go to [facility] at the end of March. This Clinical research associate advised patient to go to [facility] and have ID card sent to her physical address.   A: Staff to monitor Q 15 mins for safety. Encouragement and support offered. Scheduled medications administered per orders. R: Patient remains safe on the unit. Patient attended group tonight. Patient visible on hte unit and interacting with peers. Patient taking administered medications.

## 2015-11-09 NOTE — BHH Group Notes (Signed)
[facility] Mental Health Association Group Therapy 11/09/2015 1:15pm  Type of Therapy: Mental Health Association Presentation  Participation Level: Active  Participation Quality: Attentive  Affect: Appropriate  Cognitive: Oriented  Insight: Developing/Improving  Engagement in Therapy: Engaged  Modes of Intervention: Discussion, Education and Socialization  Summary of Progress/Problems: Mental Health Association (MHA) Speaker came to talk about his personal journey with substance abuse and addiction. The pt processed ways by which to relate to the speaker. MHA speaker provided handouts and educational information pertaining to groups and services offered by the [facility]. Pt was engaged in speaker's presentation and was receptive to resources provided.    Chad Cordial, LCSWA 11/09/2015 4:03 PM

## 2015-11-09 NOTE — BHH Group Notes (Signed)
[facility] LCSW Aftercare Discharge Planning Group Note  11/09/2015 8:45 AM  Participation Quality: Alert, Appropriate and Oriented  Mood/Affect: Appropriate  Depression Rating: 5  Anxiety Rating: 5  Thoughts of Suicide: Pt denies SI/HI  Will you contract for safety? Yes  Current AVH: Pt denies  Plan for Discharge/Comments: Pt attended discharge planning group and actively participated in group. CSW discussed suicide prevention education with the group and encouraged them to discuss discharge planning and any relevant barriers. Pt expresses that she is feeling better today and is planning to discharge tomorrow. No needs identified at this time.   Transportation Means: Pt reports access to transportation  Supports: No supports mentioned at this time  Chad Cordial, LCSWA 11/09/2015 4:01 PM

## 2015-11-10 MED ORDER — PHENYTOIN SODIUM EXTENDED 200 MG PO CAPS: 200.0000 mg | ORAL_CAPSULE | Freq: Two times a day (BID) | ORAL | Status: DC

## 2015-11-10 MED ORDER — RISPERIDONE 1 MG PO TABS: 5.0000 mg | ORAL_TABLET | Freq: Every day | ORAL | Status: DC

## 2015-11-10 MED ORDER — HYDROXYZINE HCL 25 MG PO TABS: 25.0000 mg | ORAL_TABLET | Freq: Four times a day (QID) | ORAL | Status: DC | PRN

## 2015-11-10 MED ORDER — ZOLPIDEM TARTRATE 10 MG PO TABS: 10.0000 mg | ORAL_TABLET | Freq: Every evening | ORAL | Status: DC | PRN

## 2015-11-10 MED ORDER — OXCARBAZEPINE 150 MG PO TABS: 150.0000 mg | ORAL_TABLET | Freq: Two times a day (BID) | ORAL | Status: DC

## 2015-11-10 MED ORDER — BENZTROPINE MESYLATE 0.5 MG PO TABS: 0.5000 mg | ORAL_TABLET | Freq: Every day | ORAL | Status: DC
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
## 2015-11-10 NOTE — Progress Notes (Signed)
Paula Little has been up and visible on the unit. She denies SI/HI or A/V hallucinations. She denies any physical complaints. She states that she is ready to leave so she can take care of her new puppy. She completed her self inventory and reports that her depression is 2/10, hopelessness 0/10 and anxiety 3/10. Her goal for today was "prepare myself for discharge" and she will accomplish this goal by "don't let anything (negative)get in my way." Pt. D/C from the unit with bus ticket. She was pleasant and cooperative. Alert and oriented X 3. Affect bright. D/C follow up paperwork reviewed with pt and copy sent as well as prescriptions. Belongings (from locker 25-cell phohe, personal documents, $20.00 cash, purse, coat, jacket, shoes and Medications (dilantin, risperdal and ambien) returned to pt. Q 15 min checks maintained until discharge.

## 2015-11-10 NOTE — Tx Team (Signed)
Interdisciplinary Treatment Plan Update (Adult) Date: 11/10/2015   Date: 11/10/2015 9:27 AM  Progress in Treatment:  Attending groups: Yes  Participating in groups: Yes Taking medication as prescribed: Yes  Tolerating medication: Yes  Family/Significant othe contact made: Yes, with fiance Patient understands diagnosis: Yes Discussing patient identified problems/goals with staff: Yes  Medical problems stabilized or resolved: Yes  Denies suicidal/homicidal ideation: Yes Patient has not harmed self or Others: Yes   New problem(s) identified: None identified at this time.   Discharge Plan or Barriers: Pt will return home and follow-up with [facility]  Additional comments:  Patient and CSW reviewed pt's identified goals and treatment plan. Patient verbalized understanding and agreed to treatment plan. CSW reviewed [facility] "Discharge Process and Patient Involvement" Form. Pt verbalized understanding of information provided and signed form.   Reason for Continuation of Hospitalization:  Depression Medication stabilization Suicidal ideation  Estimated length of stay: 0 days  Review of initial/current patient goals per problem list:   1.  Goal(s): Patient will participate in aftercare plan  Met:  Yes  Target date: 3-5 days from date of admission   As evidenced by: Patient will participate within aftercare plan AEB aftercare provider and housing plan at discharge being identified.   11/08/15: Pt will return home and follow-up with [facility]  2.  Goal (s): Patient will exhibit decreased depressive symptoms and suicidal ideations.  Met:  Adequate for DC  Target date: 3-5 days from date of admission   As evidenced by: Patient will utilize self rating of depression at 3 or below and demonstrate decreased signs of depression or be deemed stable for discharge by MD. 11/08/15: Pt rates depression at 7/10; denies SI 11/10/15: MD feels that Pt's symptoms have  decreased to the point that they can be managed in an outpatient setting.  Attendees:  Patient:    Family:    Physician: Dr. Shea Evans, MD  11/10/2015 9:27 AM  Nursing: Manuella Ghazi, RN; Larrie Kass., RN  11/10/2015 9:27 AM  Clinical Social Worker Peri Maris, [place] 11/10/2015 9:27 AM  Other:  11/10/2015 9:27 AM  Clinical: 11/10/2015 9:27 AM  Other: , RN Charge Nurse 11/10/2015 9:27 AM  Other:     Peri Maris, [place] Social Work [phone]

## 2015-11-10 NOTE — BHH Suicide Risk Assessment (Signed)
[facility] Discharge Suicide Risk Assessment   Principal Problem: Schizoaffective disorder, depressive type [facility]) Discharge Diagnoses:  Patient Active Problem List   Diagnosis Date Noted  . Schizoaffective disorder, depressive type (HCC) [F25.1] 11/07/2015  . Post traumatic stress disorder (PTSD) [F43.10] 09/22/2015  . Seizure disorder (HCC) [G40.909]   . Intentional overdose of drug in tablet form (HCC) [T50.902A] 08/06/2015  . Overdose [T50.901A] 08/06/2015  . Cervicitis [N72] 03/31/2013  . Chronic constipation [K59.00] 11/12/2012  . Chronic lower back pain [M54.5, G89.29] 11/12/2012  . Hot flashes [N95.1] 11/12/2012  . Kidney stones [N20.0]   . Anemia [D64.9]     Total Time spent with patient: 30 minutes  Musculoskeletal: Strength & Muscle Tone: within normal limits Gait & Station: normal Patient leans: N/A  Psychiatric Specialty Exam: Review of Systems  Psychiatric/Behavioral: Negative for depression, suicidal ideas and hallucinations. The patient is not nervous/anxious.   All other systems reviewed and are negative.   Blood pressure 105/62, pulse 72, temperature 97.7 F (36.5 C), temperature source Oral, resp. rate 18, height 5' (1.524 m), weight 90.719 kg (200 lb), last menstrual period 10/17/2015.Body mass index is 39.06 kg/(m^2).  General Appearance: Casual  Eye Contact::  Fair  Speech:  Clear and Coherent409  Volume:  Normal  Mood:  Euthymic  Affect:  Congruent  Thought Process:  Coherent  Orientation:  Full (Time, Place, and Person)  Thought Content:  WDL  Suicidal Thoughts:  No  Homicidal Thoughts:  No  Memory:  Immediate;   Fair Recent;   Fair Remote;   Fair  Judgement:  Fair  Insight:  Fair  Psychomotor Activity:  Normal  Concentration:  Fair  Recall:  [employer] of Knowledge:Fair  Language: Fair  Akathisia:  No  Handed:  Right  AIMS (if indicated):   0  Assets:  Desire for Improvement  Sleep:  Number of Hours: 6.75  Cognition: WNL  ADL's:  Intact    Mental Status Per Nursing Assessment::   On Admission:  Suicidal ideation indicated by patient, NA  Demographic Factors:  Caucasian  Loss Factors: NA  Historical Factors: Impulsivity  Risk Reduction Factors:   Positive social support  Continued Clinical Symptoms:  Alcohol/Substance Abuse/Dependencies Previous Psychiatric Diagnoses and Treatments Medical Diagnoses and Treatments/Surgeries  Cognitive Features That Contribute To Risk:  None    Suicide Risk:  Minimal: No identifiable suicidal ideation.  Patients presenting with no risk factors but with morbid ruminations; may be classified as minimal risk based on the severity of the depressive symptoms  Follow-up Information    Follow up with [facility] On 11/11/2015.   Why:  at 2:30pm for your hospital discharge appointment.   Contact information:   [address] [place], [place] [address] Phone: [phone] Fax: [phone]      Plan Of Care/Follow-up recommendations:  Activity:  no restrictions Diet:  regular Tests:  as needed Other:  follow up with after care  Luby Seamans, MD 11/10/2015, 9:26 AM

## 2015-11-10 NOTE — Progress Notes (Signed)
  [facility] Adult Case Management Discharge Plan :  Will you be returning to the same living situation after discharge:  Yes,  Pt returning home At discharge, do you have transportation home?: Yes,  Pt provided with bus pass and PART fare Do you have the ability to pay for your medications: Yes,  Pt provided with prescriptions  Release of information consent forms completed and in the chart;  Patient's signature needed at discharge.  Patient to Follow up at: Follow-up Information    Follow up with [facility] On 11/11/2015.   Why:  at 2:30pm for your hospital discharge appointment.   Contact information:   [address] [place], [place] [address] Phone: [phone] Fax: [phone]      Next level of care provider has access to [facility] Link:no  Safety Planning and Suicide Prevention discussed: Yes,  with fiance; see SPE note  Have you used any form of tobacco in the last 30 days? (Cigarettes, Smokeless Tobacco, Cigars, and/or Pipes): Yes  Has patient been referred to the Quitline?: Patient refused referral  Patient has been referred for addiction treatment: N/A  Elaina Hoops 11/10/2015, 9:28 AM

## 2015-11-10 NOTE — Progress Notes (Signed)
D: Patient alert and oriented x 4. Patient denies pain/SI/HI/AVH. Patient states she is ready for discharge tomorrow.  A: Staff to monitor Q 15 mins for safety. Encouragement and support offered. Scheduled medications administered per orders. R: Patient remains safe on the unit. Patient attended group tonight. Patient visible on the unit and interacting with peers. Patient taking administered medications.

## 2015-11-10 NOTE — BHH Group Notes (Signed)
BHH Group Notes:  (Nursing/MHT/Case Management/Adjunct)  Date:  11/10/2015  Time:  10:15 am  Type of Therapy:  Nurse Education  Participation Level:  Active  Participation Quality:  Appropriate and Attentive  Affect:  Appropriate  Cognitive:  Appropriate  Insight:  Improving  Engagement in Group:  Developing/Improving and Engaged  Modes of Intervention:  Discussion and Education  Summary of Progress/Problems:  Group topic was Leisure and lifestyle changes.  Discussed making positive changes with leisure activities, trying to focus on positives, make changes with our negative feelings/ideas and importance of daily goals.  Deriyah states that she likes swimming, listening to music and dancing for fun and she feels that a positive attribute is loving pets.   Norm Parcel [place] Coffel 11/10/2015, 11:46 AM

## 2015-11-15 ENCOUNTER — Encounter (HOSPITAL_COMMUNITY): Payer: Self-pay | Admitting: Cardiology

## 2015-11-15 ENCOUNTER — Emergency Department (HOSPITAL_COMMUNITY)
Admission: EM | Admit: 2015-11-15 | Discharge: 2015-11-16 | Disposition: A | Discharge status: Other Acute Inpatient Hospital | Payer: Medicare Other | Attending: Emergency Medicine | Admitting: Emergency Medicine

## 2015-11-15 DIAGNOSIS — Z87442 Personal history of urinary calculi: Secondary | ICD-10-CM | POA: Insufficient documentation

## 2015-11-15 DIAGNOSIS — Z880 Allergy status to penicillin: Secondary | ICD-10-CM | POA: Diagnosis not present

## 2015-11-15 DIAGNOSIS — F17210 Nicotine dependence, cigarettes, uncomplicated: Secondary | ICD-10-CM | POA: Insufficient documentation

## 2015-11-15 DIAGNOSIS — R45851 Suicidal ideations: Secondary | ICD-10-CM | POA: Diagnosis present

## 2015-11-15 DIAGNOSIS — Z862 Personal history of diseases of the blood and blood-forming organs and certain disorders involving the immune mechanism: Secondary | ICD-10-CM | POA: Insufficient documentation

## 2015-11-15 DIAGNOSIS — F3130 Bipolar disorder, current episode depressed, mild or moderate severity, unspecified: Secondary | ICD-10-CM | POA: Diagnosis not present

## 2015-11-15 DIAGNOSIS — Z79899 Other long term (current) drug therapy: Secondary | ICD-10-CM | POA: Diagnosis not present

## 2015-11-15 DIAGNOSIS — R440 Auditory hallucinations: Secondary | ICD-10-CM | POA: Insufficient documentation

## 2015-11-15 DIAGNOSIS — G8929 Other chronic pain: Secondary | ICD-10-CM | POA: Insufficient documentation

## 2015-11-15 DIAGNOSIS — F419 Anxiety disorder, unspecified: Secondary | ICD-10-CM | POA: Diagnosis not present

## 2015-11-15 LAB — COMPREHENSIVE METABOLIC PANEL
ALK PHOS: 109 U/L (ref 38–126)
ALT: 41 U/L (ref 14–54)
AST: 29 U/L (ref 15–41)
Albumin: 3.7 g/dL (ref 3.5–5.0)
Anion gap: 12 (ref 5–15)
BUN: 8 mg/dL (ref 6–20)
CALCIUM: 9.4 mg/dL (ref 8.9–10.3)
CHLORIDE: 104 mmol/L (ref 101–111)
CO2: 24 mmol/L (ref 22–32)
CREATININE: 0.69 mg/dL (ref 0.44–1.00)
Glucose, Bld: 83 mg/dL (ref 65–99)
Potassium: 4 mmol/L (ref 3.5–5.1)
Sodium: 140 mmol/L (ref 135–145)
Total Bilirubin: 0.2 mg/dL — ABNORMAL LOW (ref 0.3–1.2)
Total Protein: 7 g/dL (ref 6.5–8.1)

## 2015-11-15 LAB — CBC
HCT: 38.1 % (ref 36.0–46.0)
HEMOGLOBIN: 12.7 g/dL (ref 12.0–15.0)
MCH: 30.3 pg (ref 26.0–34.0)
MCHC: 33.3 g/dL (ref 30.0–36.0)
MCV: 90.9 fL (ref 78.0–100.0)
PLATELETS: 248 10*3/uL (ref 150–400)
RBC: 4.19 MIL/uL (ref 3.87–5.11)
RDW: 13.1 % (ref 11.5–15.5)
WBC: 7.5 10*3/uL (ref 4.0–10.5)

## 2015-11-15 LAB — RAPID URINE DRUG SCREEN, HOSP PERFORMED
AMPHETAMINES: NOT DETECTED
Barbiturates: NOT DETECTED
Benzodiazepines: NOT DETECTED
COCAINE: NOT DETECTED
OPIATES: NOT DETECTED
TETRAHYDROCANNABINOL: NOT DETECTED

## 2015-11-15 LAB — SALICYLATE LEVEL

## 2015-11-15 LAB — ETHANOL

## 2015-11-15 LAB — ACETAMINOPHEN LEVEL: Acetaminophen (Tylenol), Serum: <10 ug/mL — ABNORMAL LOW (ref 10–30)

## 2015-11-15 LAB — PHENYTOIN LEVEL, TOTAL: Phenytoin Lvl: 4.8 ug/mL — ABNORMAL LOW (ref 10.0–20.0)

## 2015-11-15 MED ORDER — BENZTROPINE MESYLATE 1 MG PO TABS
0.5000 mg | ORAL_TABLET | Freq: Every day | ORAL | Status: DC
  Administered 2015-11-15: 0.5 mg via ORAL
  Filled 2015-11-15: qty 1

## 2015-11-15 MED ORDER — OXCARBAZEPINE 300 MG PO TABS
150.0000 mg | ORAL_TABLET | Freq: Two times a day (BID) | ORAL | Status: DC
  Administered 2015-11-15 – 2015-11-16 (×2): 150 mg via ORAL
  Filled 2015-11-15 (×2): qty 1

## 2015-11-15 MED ORDER — ZOLPIDEM TARTRATE 5 MG PO TABS
10.0000 mg | ORAL_TABLET | Freq: Every evening | ORAL | Status: DC | PRN
  Administered 2015-11-15: 10 mg via ORAL
  Filled 2015-11-15: qty 2

## 2015-11-15 MED ORDER — RISPERIDONE 0.5 MG PO TABS
5.0000 mg | ORAL_TABLET | Freq: Every day | ORAL | Status: DC
  Administered 2015-11-15: 5 mg via ORAL
  Filled 2015-11-15: qty 10

## 2015-11-15 MED ORDER — HYDROXYZINE HCL 25 MG PO TABS: 25.0000 mg | ORAL_TABLET | Freq: Four times a day (QID) | ORAL | Status: DC | PRN

## 2015-11-15 MED ORDER — ACETAMINOPHEN 325 MG PO TABS: 650.0000 mg | ORAL_TABLET | ORAL | Status: DC | PRN

## 2015-11-15 NOTE — ED Notes (Signed)
Pt reports she is having SI after losing her grandmother yesterday. Denis any drugs or ETOH. Pt states that she does not have a plan.

## 2015-11-15 NOTE — ED Notes (Signed)
Pt changed into scrubs and staffing office called.

## 2015-11-15 NOTE — ED Provider Notes (Signed)
CSN: [id number]     Arrival date & time 11/15/15  1251 History  By signing my name below, I, Bethel Born, attest that this documentation has been prepared under the direction and in the presence of Samantha Dowless. Electronically Signed: Bethel Born, ED Scribe. 11/15/2015 7:56 PM   Chief Complaint  Patient presents with  . Suicidal   The history is provided by the patient. No language interpreter was used.   Paula Little is a 30 y.o. female with history of bipolar disorder, depression, and anxiety who presents to the Emergency Department for evaluation after a suicide attempt today. Pt states that she held a knife up to her abdomen with the intention to harm herself but her fiance stopped her. She believes that her symptoms were triggered by her grandmother's death yesterday.  She was having hallucinations yesterday but has been told that it is common to hear the voices of deceased loved ones. Pt states that she hears her dead brother's voice. Denies HI, chest pain, and SOB. She denies alcohol and illicit drug use. Pt states that she is no longer on Dilantin, she was switched to a different medication.  Past Medical History  Diagnosis Date  . Kidney stones   . Anemia   . Chronic abdominal pain   . Seizures (HCC)   . Depression   . Anxiety   . Bipolar disorder [facility])    Past Surgical History  Procedure Laterality Date  . Cholecystectomy    . Eye surgery     Family History  Problem Relation Age of Onset  . Depression Mother   . Anxiety disorder Mother   . Bipolar disorder Mother    Social History  Substance Use Topics  . Smoking status: Current Every Day Smoker -- 1.00 packs/day    Types: Cigarettes  . Smokeless tobacco: Never Used  . Alcohol Use: Yes     Comment: 2 drinks every 3 months. Denies hx of DUI/DWI, detox and rehab   OB History    No data available     Review of Systems  Respiratory: Negative for shortness of breath.   Cardiovascular: Negative  for chest pain.  Psychiatric/Behavioral: Positive for suicidal ideas and hallucinations.  All other systems reviewed and are negative.     Allergies  Compazine; Haldol; Reglan; Dicyclomine hcl; Gold; Motrin; Other; Shellfish allergy; Shellfish-derived products; Ondansetron; Trazodone; Aspirin; Donnatal; Fluoxetine; Latex; Lidocaine; Maalox; Mushroom extract complex; Orange fruit; Pb-hyoscy-atropine-scopol er; Penicillins; Prozac; Toradol; Tramadol; and Zofran  Home Medications   Prior to Admission medications   Medication Sig Start Date End Date Taking? Authorizing Provider  benztropine (COGENTIN) 0.5 MG tablet Take 1 tablet (0.5 mg total) by mouth at bedtime. 11/10/15   Adonis Brook, NP  hydrOXYzine (ATARAX/VISTARIL) 25 MG tablet Take 1 tablet (25 mg total) by mouth every 6 (six) hours as needed for anxiety. 11/10/15   Adonis Brook, NP  OXcarbazepine (TRILEPTAL) 150 MG tablet Take 1 tablet (150 mg total) by mouth 2 (two) times daily. 11/10/15   Adonis Brook, NP  phenytoin (DILANTIN) 200 MG ER capsule Take 1 capsule (200 mg total) by mouth 2 (two) times daily. 11/10/15   Adonis Brook, NP  risperiDONE (RISPERDAL) 1 MG tablet Take 5 tablets (5 mg total) by mouth at bedtime. 11/10/15   Adonis Brook, NP  zolpidem (AMBIEN) 10 MG tablet Take 1 tablet (10 mg total) by mouth at bedtime as needed for sleep. 11/10/15 12/10/15  Adonis Brook, NP   BP 110/66 mmHg  Pulse  91  Temp(Src) 97.8 F (36.6 C)  Resp 18  Wt 200 lb (90.719 kg)  SpO2 99%  LMP 10/17/2015 (Exact Date) Physical Exam  Constitutional: She is oriented to person, place, and time. She appears well-developed and well-nourished. No distress.  HENT:  Head: Normocephalic and atraumatic.  Mouth/Throat: No oropharyngeal exudate.  Eyes: Conjunctivae and EOM are normal. Pupils are equal, round, and reactive to light. Right eye exhibits no discharge. Left eye exhibits no discharge. No scleral icterus.  Neck: Neck supple.  Cardiovascular:  Normal rate, regular rhythm, normal heart sounds and intact distal pulses.  Exam reveals no gallop and no friction rub.   No murmur heard. Pulmonary/Chest: Effort normal and breath sounds normal. No respiratory distress. She has no wheezes. She has no rales. She exhibits no tenderness.  Abdominal: Soft. She exhibits no distension. There is no tenderness. There is no guarding.  Musculoskeletal: Normal range of motion. She exhibits no edema.  Lymphadenopathy:    She has no cervical adenopathy.  Neurological: She is alert and oriented to person, place, and time.  Strength 5/5 throughout. No sensory deficits.    Skin: Skin is warm and dry. No rash noted. She is not diaphoretic. No erythema. No pallor.  Psychiatric: Her speech is normal and behavior is normal. She exhibits a depressed mood. She expresses suicidal ideation. She expresses no homicidal ideation. She expresses suicidal plans. She expresses no homicidal plans.  Nursing note and vitals reviewed.   ED Course  Procedures (including critical care time) DIAGNOSTIC STUDIES: Oxygen Saturation is 99% on RA,  normal by my interpretation.    COORDINATION OF CARE: 7:56 PM Discussed treatment plan which includes lab work and TTS consult with pt at bedside and pt agreed to plan.  Labs Review Labs Reviewed  COMPREHENSIVE METABOLIC PANEL - Abnormal; Notable for the following:    Total Bilirubin 0.2 (*)    All other components within normal limits  ACETAMINOPHEN LEVEL - Abnormal; Notable for the following:    Acetaminophen (Tylenol), Serum <10 (*)    All other components within normal limits  PHENYTOIN LEVEL, TOTAL - Abnormal; Notable for the following:    Phenytoin Lvl 4.8 (*)    All other components within normal limits  ETHANOL  SALICYLATE LEVEL  CBC  URINE RAPID DRUG SCREEN, HOSP PERFORMED  I-STAT BETA HCG BLOOD, ED ([facility], [facility], [facility] ONLY)    Imaging Review No results found. I have personally reviewed and evaluated these lab results  as part of my medical decision-making.   EKG Interpretation None      MDM   Final diagnoses:  Suicidal ideation    Patient presents with suicidal ideation with a known plan. No drug or alcohol use. Otherwise are stable. Patient is medically cleared and will await TTS consult.  I personally performed the services described in this documentation, which was scribed in my presence. The recorded information has been reviewed and is accurate.     Lester KinsmanSamantha Tripp BillingsleyDowless, PA-C 11/15/15 2326  Doug SouSam Jacubowitz, MD 11/16/15 [phone]

## 2015-11-16 ENCOUNTER — Observation Stay (HOSPITAL_COMMUNITY)
Admission: AD | Admit: 2015-11-16 | Discharge: 2015-11-17 | Disposition: A | Discharge status: Home / Self Care | Payer: Medicare Other | Source: Intra-hospital | Attending: Psychiatry | Admitting: Psychiatry

## 2015-11-16 ENCOUNTER — Encounter (HOSPITAL_COMMUNITY): Payer: Self-pay

## 2015-11-16 DIAGNOSIS — F250 Schizoaffective disorder, bipolar type: Secondary | ICD-10-CM | POA: Insufficient documentation

## 2015-11-16 DIAGNOSIS — Z886 Allergy status to analgesic agent status: Secondary | ICD-10-CM | POA: Insufficient documentation

## 2015-11-16 DIAGNOSIS — R45851 Suicidal ideations: Secondary | ICD-10-CM | POA: Insufficient documentation

## 2015-11-16 DIAGNOSIS — F331 Major depressive disorder, recurrent, moderate: Secondary | ICD-10-CM | POA: Diagnosis present

## 2015-11-16 DIAGNOSIS — Z888 Allergy status to other drugs, medicaments and biological substances status: Secondary | ICD-10-CM

## 2015-11-16 DIAGNOSIS — F4310 Post-traumatic stress disorder, unspecified: Secondary | ICD-10-CM

## 2015-11-16 DIAGNOSIS — Z880 Allergy status to penicillin: Secondary | ICD-10-CM

## 2015-11-16 DIAGNOSIS — Z91040 Latex allergy status: Secondary | ICD-10-CM | POA: Insufficient documentation

## 2015-11-16 DIAGNOSIS — Z91013 Allergy to seafood: Secondary | ICD-10-CM

## 2015-11-16 DIAGNOSIS — Z87442 Personal history of urinary calculi: Secondary | ICD-10-CM

## 2015-11-16 DIAGNOSIS — Z885 Allergy status to narcotic agent status: Secondary | ICD-10-CM | POA: Insufficient documentation

## 2015-11-16 DIAGNOSIS — F3130 Bipolar disorder, current episode depressed, mild or moderate severity, unspecified: Secondary | ICD-10-CM | POA: Diagnosis not present

## 2015-11-16 DIAGNOSIS — F411 Generalized anxiety disorder: Secondary | ICD-10-CM | POA: Diagnosis present

## 2015-11-16 DIAGNOSIS — F17210 Nicotine dependence, cigarettes, uncomplicated: Secondary | ICD-10-CM | POA: Insufficient documentation

## 2015-11-16 DIAGNOSIS — Z91018 Allergy to other foods: Secondary | ICD-10-CM

## 2015-11-16 MED ORDER — ACETAMINOPHEN 325 MG PO TABS: 650.0000 mg | ORAL_TABLET | Freq: Four times a day (QID) | ORAL | Status: DC | PRN

## 2015-11-16 MED ORDER — FAMOTIDINE 20 MG PO TABS
10.0000 mg | ORAL_TABLET | Freq: Every day | ORAL | Status: DC
  Administered 2015-11-16: 10 mg via ORAL
  Filled 2015-11-16: qty 1

## 2015-11-16 MED ORDER — BENZTROPINE MESYLATE 0.5 MG PO TABS
0.5000 mg | ORAL_TABLET | Freq: Every day | ORAL | Status: DC
  Administered 2015-11-16: 0.5 mg via ORAL
  Filled 2015-11-16: qty 1

## 2015-11-16 MED ORDER — HYDROXYZINE HCL 25 MG PO TABS: 25.0000 mg | ORAL_TABLET | Freq: Four times a day (QID) | ORAL | Status: DC | PRN

## 2015-11-16 MED ORDER — ACETAMINOPHEN 325 MG PO TABS: 650.0000 mg | ORAL_TABLET | ORAL | Status: DC | PRN

## 2015-11-16 MED ORDER — ZOLPIDEM TARTRATE 5 MG PO TABS
10.0000 mg | ORAL_TABLET | Freq: Every evening | ORAL | Status: DC | PRN
  Administered 2015-11-16: 10 mg via ORAL
  Filled 2015-11-16: qty 2

## 2015-11-16 MED ORDER — RISPERIDONE 3 MG PO TABS
5.0000 mg | ORAL_TABLET | Freq: Every day | ORAL | Status: DC
  Administered 2015-11-16: 5 mg via ORAL
  Filled 2015-11-16: qty 1

## 2015-11-16 MED ORDER — PHENYTOIN SODIUM EXTENDED 100 MG PO CAPS
200.0000 mg | ORAL_CAPSULE | Freq: Two times a day (BID) | ORAL | Status: DC
  Administered 2015-11-16: 200 mg via ORAL
  Filled 2015-11-16: qty 2

## 2015-11-16 MED ORDER — MAGNESIUM HYDROXIDE 400 MG/5ML PO SUSP: 30.0000 mL | Freq: Every day | ORAL | Status: DC | PRN

## 2015-11-16 MED ORDER — FAMOTIDINE 20 MG PO TABS
10.0000 mg | ORAL_TABLET | Freq: Every day | ORAL | Status: DC
  Administered 2015-11-17: 10 mg via ORAL
  Filled 2015-11-16: qty 1

## 2015-11-16 NOTE — Progress Notes (Signed)
Dr Lucian[facility] Report can be called at [phone]. Pt can arrive anytime.  Ilean SkillMeghan Lynton Crescenzo, MSW, LCSW Clinical Social Work, Disposition  11/16/2015 [phone]

## 2015-11-16 NOTE — ED Notes (Signed)
TTS in process 
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
## 2015-11-16 NOTE — ED Provider Notes (Signed)
29yo hx of seizures, depression presents with concern for SI.  Patient medically cleared.  Patient stable on my evaluation, no medical concerns, stable for transfer to psychiatric facility.    Alvira MondayErin Treyson Axel, MD 11/16/15 [phone]

## 2015-11-16 NOTE — ED Notes (Signed)
Pt at nurses station using phone call.
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
## 2015-11-16 NOTE — Progress Notes (Signed)
Paula Little on unit in bed at shift change.  Pt alert, awake and watching TV.  Pt currently denies SI, HI and AV/H.  Pt agrees, verbally to notify staff if feeling SI.  Pt is depressed at loss of grandmother yesterday.  Pt disappointed that she is not on adult unit.  Pt is previous admit.  Pt has hx of ETOH use and has been incarcerated for prostitution in the past. Hx of depression, anxiety and ETOH use.  Patient is cooperative and freely answers any questions.

## 2015-11-16 NOTE — ED Notes (Signed)
Pt has visitor at bedside.

## 2015-11-17 DIAGNOSIS — F3130 Bipolar disorder, current episode depressed, mild or moderate severity, unspecified: Secondary | ICD-10-CM | POA: Diagnosis not present

## 2015-11-17 MED ORDER — RANITIDINE HCL 75 MG PO TABS: 75.0000 mg | ORAL_TABLET | Freq: Two times a day (BID) | ORAL | Status: DC

## 2015-11-17 MED ORDER — RISPERIDONE 1 MG PO TABS: 5.0000 mg | ORAL_TABLET | Freq: Every day | ORAL | Status: DC

## 2015-11-17 NOTE — Discharge Instructions (Signed)
[facility]
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
## 2015-11-17 NOTE — Progress Notes (Signed)
Pt is continuously observed on unit except during bathroom visits, for safety

## 2015-11-17 NOTE — Progress Notes (Signed)
Nursing Shift Note:  Patient is sleeping, easily awakens to take am medications. Patient denying any SI/HI/AVH at this time and contracts for safety on the Unit in that she agrees to alert staff before acting out on any self harm behavior should she develop any such thoughts. Nurse providing emotional support, therapeutic communication, medications as ordered and ensuring continuous observation of patient except when in the bathroom for safety.  Patient contracts for safety and remains safe on Unit.

## 2015-11-17 NOTE — Progress Notes (Signed)
Nursing Discharge Note:  Patient has received all belongings and signed for them as well as signing the Discharge Summary and also given a copy of same. Patient continues to deny any SI/HI/AVH and states understanding of followup appointments and medications prescribed. Patient escorted from Unit by staff member to [place] to walk to nearby bus stop after having been given bus pass and small amount of money for bus fare to [place].

## 2015-12-06 ENCOUNTER — Encounter (HOSPITAL_COMMUNITY): Payer: Self-pay | Admitting: Emergency Medicine

## 2015-12-06 ENCOUNTER — Emergency Department (HOSPITAL_COMMUNITY)
Admission: EM | Admit: 2015-12-06 | Discharge: 2015-12-06 | Disposition: A | Discharge status: Home / Self Care | Payer: Medicare Other | Attending: Emergency Medicine | Admitting: Emergency Medicine

## 2015-12-06 ENCOUNTER — Emergency Department (HOSPITAL_COMMUNITY): Payer: Medicare Other

## 2015-12-06 DIAGNOSIS — Z91040 Latex allergy status: Secondary | ICD-10-CM | POA: Diagnosis not present

## 2015-12-06 DIAGNOSIS — Z862 Personal history of diseases of the blood and blood-forming organs and certain disorders involving the immune mechanism: Secondary | ICD-10-CM | POA: Insufficient documentation

## 2015-12-06 DIAGNOSIS — Z880 Allergy status to penicillin: Secondary | ICD-10-CM | POA: Diagnosis not present

## 2015-12-06 DIAGNOSIS — Z79899 Other long term (current) drug therapy: Secondary | ICD-10-CM | POA: Insufficient documentation

## 2015-12-06 DIAGNOSIS — F419 Anxiety disorder, unspecified: Secondary | ICD-10-CM | POA: Diagnosis not present

## 2015-12-06 DIAGNOSIS — Z87442 Personal history of urinary calculi: Secondary | ICD-10-CM | POA: Insufficient documentation

## 2015-12-06 DIAGNOSIS — G8929 Other chronic pain: Secondary | ICD-10-CM | POA: Insufficient documentation

## 2015-12-06 DIAGNOSIS — R109 Unspecified abdominal pain: Secondary | ICD-10-CM | POA: Insufficient documentation

## 2015-12-06 DIAGNOSIS — F17210 Nicotine dependence, cigarettes, uncomplicated: Secondary | ICD-10-CM | POA: Insufficient documentation

## 2015-12-06 DIAGNOSIS — R112 Nausea with vomiting, unspecified: Secondary | ICD-10-CM | POA: Diagnosis not present

## 2015-12-06 DIAGNOSIS — Z3202 Encounter for pregnancy test, result negative: Secondary | ICD-10-CM | POA: Diagnosis not present

## 2015-12-06 DIAGNOSIS — F319 Bipolar disorder, unspecified: Secondary | ICD-10-CM | POA: Insufficient documentation

## 2015-12-06 LAB — COMPREHENSIVE METABOLIC PANEL
ALT: 25 U/L (ref 14–54)
AST: 22 U/L (ref 15–41)
Albumin: 3.7 g/dL (ref 3.5–5.0)
Alkaline Phosphatase: 95 U/L (ref 38–126)
Anion gap: 11 (ref 5–15)
BUN: <5 mg/dL — ABNORMAL LOW (ref 6–20)
CHLORIDE: 107 mmol/L (ref 101–111)
CO2: 22 mmol/L (ref 22–32)
Calcium: 9.2 mg/dL (ref 8.9–10.3)
Creatinine, Ser: 0.61 mg/dL (ref 0.44–1.00)
Glucose, Bld: 101 mg/dL — ABNORMAL HIGH (ref 65–99)
POTASSIUM: 3.9 mmol/L (ref 3.5–5.1)
Sodium: 140 mmol/L (ref 135–145)
TOTAL PROTEIN: 6.5 g/dL (ref 6.5–8.1)
Total Bilirubin: 0.6 mg/dL (ref 0.3–1.2)

## 2015-12-06 LAB — URINALYSIS, ROUTINE W REFLEX MICROSCOPIC
Bilirubin Urine: NEGATIVE
GLUCOSE, UA: NEGATIVE mg/dL
Hgb urine dipstick: NEGATIVE
Ketones, ur: NEGATIVE mg/dL
LEUKOCYTES UA: NEGATIVE
Nitrite: NEGATIVE
PH: 7.5 (ref 5.0–8.0)
PROTEIN: NEGATIVE mg/dL
Specific Gravity, Urine: 1.011 (ref 1.005–1.030)

## 2015-12-06 LAB — CBC
HEMATOCRIT: 38 % (ref 36.0–46.0)
Hemoglobin: 12.6 g/dL (ref 12.0–15.0)
MCH: 30.2 pg (ref 26.0–34.0)
MCHC: 33.2 g/dL (ref 30.0–36.0)
MCV: 91.1 fL (ref 78.0–100.0)
PLATELETS: 236 10*3/uL (ref 150–400)
RBC: 4.17 MIL/uL (ref 3.87–5.11)
RDW: 13 % (ref 11.5–15.5)
WBC: 6 10*3/uL (ref 4.0–10.5)

## 2015-12-06 LAB — I-STAT BETA HCG BLOOD, ED (MC, WL, AP ONLY)

## 2015-12-06 LAB — I-STAT TROPONIN, ED: Troponin i, poc: 0 ng/mL (ref 0.00–0.08)

## 2015-12-06 LAB — LIPASE, BLOOD: LIPASE: 26 U/L (ref 11–51)

## 2015-12-06 MED ORDER — SODIUM CHLORIDE 0.9 % IV BOLUS (SEPSIS)
1000.0000 mL | Freq: Once | INTRAVENOUS | Status: AC
  Administered 2015-12-06: 1000 mL via INTRAVENOUS

## 2015-12-06 MED ORDER — PROMETHAZINE HCL 25 MG PO TABS
25.0000 mg | ORAL_TABLET | Freq: Once | ORAL | Status: AC
  Administered 2015-12-06: 25 mg via ORAL
  Filled 2015-12-06: qty 1

## 2015-12-06 MED ORDER — MORPHINE SULFATE (PF) 2 MG/ML IV SOLN
2.0000 mg | Freq: Once | INTRAVENOUS | Status: AC
  Administered 2015-12-06: 2 mg via INTRAVENOUS
  Filled 2015-12-06: qty 1

## 2015-12-06 MED ORDER — PROMETHAZINE HCL 25 MG PO TABS: 25.0000 mg | ORAL_TABLET | Freq: Four times a day (QID) | ORAL | Status: DC | PRN

## 2015-12-06 MED ORDER — LORAZEPAM 2 MG/ML IJ SOLN
1.0000 mg | Freq: Once | INTRAMUSCULAR | Status: AC
  Administered 2015-12-06: 1 mg via INTRAVENOUS
  Filled 2015-12-06: qty 1

## 2015-12-06 MED ORDER — SUCRALFATE 1 G PO TABS: 1.0000 g | ORAL_TABLET | Freq: Three times a day (TID) | ORAL | Status: DC

## 2015-12-06 MED ORDER — MORPHINE SULFATE (PF) 4 MG/ML IV SOLN: 4.0000 mg | Freq: Once | INTRAVENOUS | Status: DC

## 2015-12-06 NOTE — ED Notes (Signed)
Pt states "I cant keep anything down, been vomiting x4 days, chest pain off and on for the last three days. I took something for the pain and it hasn't even touched it." Pt also c/o "all over abdominal pain, it comes and goes".

## 2015-12-06 NOTE — Discharge Instructions (Signed)
Your labs today were normal. I will give you a couple prescriptions to help with your pain and your nausea/vomiting. Please call [facility] to schedule a follow up appointment for further evaluation. Return to the ER for new or worsening symptoms.

## 2015-12-06 NOTE — ED Provider Notes (Signed)
CSN: [id number]     Arrival date & time 12/06/15  1247 History   First MD Initiated Contact with Patient 12/06/15 2109     Chief Complaint  Patient presents with  . Chest Pain  . Abdominal Pain  . Nausea  . Emesis   HPI  Paula Little is an 30 y.o. female with history of bipolar disorder, chronic abdominal pain, depression, anxiety who presents to the ED for evaluation of abdominal pain, n/v, and chest pain. She states her abdominal pain began four days ago. She describes the pain as sharp pain diffusely across her entire abdomen, though mainly throughout the lower abdominal area and left upper quadrant. She reports she has not been able to tolerate anything PO (liquid or food) for the past four days. States last episode of emesis was earlier this afternoon in the waiting room. Denies diarrhea. She states three days ago her chest began hurting. She states the pain feels like a substernal burning pain that is worse after vomiting. Denies radiation of her chest pain. States she has taken tylenol with no relief. Denies fever, chills, SOB, diaphoresis. Denies dysuria, urinary frequency/urgency, vaginal symptoms. Of note, pt has been seen multiple times in the ER once every few months with similar presentation. Her workup is typically negative.   Past Medical History  Diagnosis Date  . Kidney stones   . Anemia   . Chronic abdominal pain   . Seizures ([facility])   . Depression   . Anxiety   . Bipolar disorder [facility](HCC)    Past Surgical History  Procedure Laterality Date  . Cholecystectomy    . Eye surgery     Family History  Problem Relation Age of Onset  . Depression Mother   . Anxiety disorder Mother   . Bipolar disorder Mother    Social History  Substance Use Topics  . Smoking status: Current Every Day Smoker -- 1.00 packs/day    Types: Cigarettes  . Smokeless tobacco: Never Used  . Alcohol Use: Yes     Comment: 2 drinks every 3 months. Denies hx of DUI/DWI, detox and rehab   OB  History    No data available     Review of Systems  All other systems reviewed and are negative.     Allergies  Compazine; Haldol; Reglan; Dicyclomine hcl; Gold; Motrin; Other; Shellfish allergy; Shellfish-derived products; Ondansetron; Trazodone; Aspirin; Donnatal; Fluoxetine; Latex; Lidocaine; Maalox; Mushroom extract complex; Orange fruit; Pb-hyoscy-atropine-scopol er; Penicillins; Prozac; Toradol; Tramadol; and Zofran  Home Medications   Prior to Admission medications   Medication Sig Start Date End Date Taking? Authorizing Provider  hydrOXYzine (ATARAX/VISTARIL) 25 MG tablet Take 1 tablet (25 mg total) by mouth every 6 (six) hours as needed for anxiety. 11/10/15  Yes Adonis BrookSheila Agustin, NP  ranitidine (ZANTAC) 75 MG tablet Take 1 tablet (75 mg total) by mouth 2 (two) times daily. 11/17/15  Yes Beau FannyJohn C Withrow, FNP  risperiDONE (RISPERDAL) 1 MG tablet Take 5 tablets (5 mg total) by mouth at bedtime. 11/17/15  Yes Beau FannyJohn C Withrow, FNP  zolpidem (AMBIEN) 10 MG tablet Take 1 tablet (10 mg total) by mouth at bedtime as needed for sleep. Patient taking differently: Take 10 mg by mouth at bedtime.  11/10/15 12/10/15 Yes Adonis BrookSheila Agustin, NP   BP 111/65 mmHg  Pulse 76  Temp(Src) 98 F (36.7 C) (Oral)  Resp 18  SpO2 99%  LMP 11/25/2015 Physical Exam  Constitutional: She is oriented to person, place, and time.  HENT:  Right Ear: External ear normal.  Left Ear: External ear normal.  Nose: Nose normal.  Mouth/Throat: Oropharynx is clear and moist. No oropharyngeal exudate.  Eyes: Conjunctivae and EOM are normal. Pupils are equal, round, and reactive to light.  Neck: Normal range of motion. Neck supple.  Cardiovascular: Normal rate, regular rhythm, normal heart sounds and intact distal pulses.   Pulmonary/Chest: Effort normal and breath sounds normal. No respiratory distress. She has no wheezes.  Abdominal: Soft. Bowel sounds are normal. She exhibits no distension. There is no tenderness. There is no  rebound and no guarding.  No CVA tenderness  Musculoskeletal: She exhibits no edema.  Neurological: She is alert and oriented to person, place, and time. No cranial nerve deficit.  Skin: Skin is warm and dry.  Psychiatric: Her mood appears anxious.  Nursing note and vitals reviewed.   ED Course  Procedures (including critical care time) Labs Review Labs Reviewed  COMPREHENSIVE METABOLIC PANEL - Abnormal; Notable for the following:    Glucose, Bld 101 (*)    BUN <5 (*)    All other components within normal limits  LIPASE, BLOOD  CBC  URINALYSIS, ROUTINE W REFLEX MICROSCOPIC (NOT AT [facility])  I-STAT TROPOININ, ED  I-STAT BETA HCG BLOOD, ED ([facility], [facility], [facility] ONLY)    Imaging Review Dg Chest 2 View  12/06/2015  CLINICAL DATA:  Chest pain for 1 week EXAM: CHEST  2 VIEW COMPARISON:  August 05, 2015 FINDINGS: Lungs are clear. Heart size and pulmonary vascularity are normal. No adenopathy. No pneumothorax. No bone lesions. IMPRESSION: No edema or consolidation. Electronically Signed   By: Bretta Bang III M.D.   On: 12/06/2015 13:34   I have personally reviewed and evaluated these images and lab results as part of my medical decision-making.   EKG Interpretation None      MDM   Final diagnoses:  Chronic abdominal pain  Non-intractable vomiting with nausea, vomiting of unspecified type    Pt states IV phenergan typically works the best for her symptoms. However, IV phenergan is on backorder. Will order fluids, IV ativan as pt has many medication intolerances and I suspect this will help her nausea and anxiety. Small dose of morphine for pain. Once tolerating PO can give PO phenergan. Labs are otherwise unremarakble. Her exam is nonfocal. She is afebrile and nontoxic appearing. Will hold off on imaging at this time.   Pain and nausea improved with meds and fluids. Pt is tolerating PO. Rx given for phenergan and carafate. Referral to GI given chronic abdominal pain. Instructed to f/u  with PCP as well. ER return precautions given.   Carlene Coria, PA-C 12/07/15 0023  Gerhard Munch, MD 12/13/15 [id number]

## 2015-12-20 ENCOUNTER — Encounter (HOSPITAL_COMMUNITY): Payer: Self-pay | Admitting: Emergency Medicine

## 2015-12-20 ENCOUNTER — Emergency Department (HOSPITAL_COMMUNITY)
Admission: EM | Admit: 2015-12-20 | Discharge: 2015-12-21 | Disposition: A | Discharge status: Other Acute Inpatient Hospital | Payer: Medicare Other | Attending: Emergency Medicine | Admitting: Emergency Medicine

## 2015-12-20 DIAGNOSIS — F17210 Nicotine dependence, cigarettes, uncomplicated: Secondary | ICD-10-CM | POA: Insufficient documentation

## 2015-12-20 DIAGNOSIS — F319 Bipolar disorder, unspecified: Secondary | ICD-10-CM | POA: Insufficient documentation

## 2015-12-20 DIAGNOSIS — T50902A Poisoning by unspecified drugs, medicaments and biological substances, intentional self-harm, initial encounter: Secondary | ICD-10-CM

## 2015-12-20 DIAGNOSIS — Y9289 Other specified places as the place of occurrence of the external cause: Secondary | ICD-10-CM | POA: Insufficient documentation

## 2015-12-20 DIAGNOSIS — G8929 Other chronic pain: Secondary | ICD-10-CM | POA: Insufficient documentation

## 2015-12-20 DIAGNOSIS — Z3202 Encounter for pregnancy test, result negative: Secondary | ICD-10-CM | POA: Insufficient documentation

## 2015-12-20 DIAGNOSIS — Y9389 Activity, other specified: Secondary | ICD-10-CM | POA: Diagnosis not present

## 2015-12-20 DIAGNOSIS — F251 Schizoaffective disorder, depressive type: Secondary | ICD-10-CM | POA: Diagnosis not present

## 2015-12-20 DIAGNOSIS — Z862 Personal history of diseases of the blood and blood-forming organs and certain disorders involving the immune mechanism: Secondary | ICD-10-CM | POA: Insufficient documentation

## 2015-12-20 DIAGNOSIS — Z880 Allergy status to penicillin: Secondary | ICD-10-CM | POA: Insufficient documentation

## 2015-12-20 DIAGNOSIS — T421X2A Poisoning by iminostilbenes, intentional self-harm, initial encounter: Secondary | ICD-10-CM | POA: Insufficient documentation

## 2015-12-20 DIAGNOSIS — Z91040 Latex allergy status: Secondary | ICD-10-CM | POA: Insufficient documentation

## 2015-12-20 DIAGNOSIS — R45851 Suicidal ideations: Secondary | ICD-10-CM | POA: Diagnosis not present

## 2015-12-20 DIAGNOSIS — Z87442 Personal history of urinary calculi: Secondary | ICD-10-CM | POA: Insufficient documentation

## 2015-12-20 DIAGNOSIS — F419 Anxiety disorder, unspecified: Secondary | ICD-10-CM | POA: Insufficient documentation

## 2015-12-20 DIAGNOSIS — Z79899 Other long term (current) drug therapy: Secondary | ICD-10-CM | POA: Diagnosis not present

## 2015-12-20 DIAGNOSIS — Y998 Other external cause status: Secondary | ICD-10-CM | POA: Diagnosis not present

## 2015-12-20 DIAGNOSIS — T1491 Suicide attempt: Secondary | ICD-10-CM | POA: Diagnosis not present

## 2015-12-20 HISTORY — DX: Post-traumatic stress disorder, unspecified: F43.10

## 2015-12-20 LAB — COMPREHENSIVE METABOLIC PANEL
ALBUMIN: 3.7 g/dL (ref 3.5–5.0)
ALT: 20 U/L (ref 14–54)
AST: 25 U/L (ref 15–41)
Alkaline Phosphatase: 72 U/L (ref 38–126)
Anion gap: 6 (ref 5–15)
BILIRUBIN TOTAL: 0.2 mg/dL — AB (ref 0.3–1.2)
BUN: 8 mg/dL (ref 6–20)
CO2: 25 mmol/L (ref 22–32)
Calcium: 8.5 mg/dL — ABNORMAL LOW (ref 8.9–10.3)
Chloride: 108 mmol/L (ref 101–111)
Creatinine, Ser: 0.56 mg/dL (ref 0.44–1.00)
GFR calc Af Amer: >60 mL/min (ref >60)
GFR calc non Af Amer: >60 mL/min (ref >60)
GLUCOSE: 95 mg/dL (ref 65–99)
POTASSIUM: 3.6 mmol/L (ref 3.5–5.1)
Sodium: 139 mmol/L (ref 135–145)
TOTAL PROTEIN: 6.5 g/dL (ref 6.5–8.1)

## 2015-12-20 LAB — CBC
HEMATOCRIT: 33.7 % — AB (ref 36.0–46.0)
Hemoglobin: 11.4 g/dL — ABNORMAL LOW (ref 12.0–15.0)
MCH: 30.6 pg (ref 26.0–34.0)
MCHC: 33.8 g/dL (ref 30.0–36.0)
MCV: 90.6 fL (ref 78.0–100.0)
PLATELETS: 175 10*3/uL (ref 150–400)
RBC: 3.72 MIL/uL — ABNORMAL LOW (ref 3.87–5.11)
RDW: 13 % (ref 11.5–15.5)
WBC: 6 10*3/uL (ref 4.0–10.5)

## 2015-12-20 LAB — RAPID URINE DRUG SCREEN, HOSP PERFORMED
Amphetamines: NOT DETECTED
BARBITURATES: NOT DETECTED
Benzodiazepines: NOT DETECTED
Cocaine: NOT DETECTED
Opiates: NOT DETECTED
Tetrahydrocannabinol: NOT DETECTED

## 2015-12-20 LAB — ETHANOL: Alcohol, Ethyl (B): <5 mg/dL (ref <5)

## 2015-12-20 LAB — SALICYLATE LEVEL: Salicylate Lvl: <4 mg/dL (ref 2.8–30.0)

## 2015-12-20 LAB — ACETAMINOPHEN LEVEL: Acetaminophen (Tylenol), Serum: <10 ug/mL — ABNORMAL LOW (ref 10–30)

## 2015-12-20 LAB — I-STAT BETA HCG BLOOD, ED (MC, WL, AP ONLY)

## 2015-12-20 MED ORDER — SODIUM CHLORIDE 0.9 % IV SOLN
1000.0000 mL | INTRAVENOUS | Status: DC
  Administered 2015-12-20: 1000 mL via INTRAVENOUS

## 2015-12-20 MED ORDER — CHARCOAL ACTIVATED PO LIQD
50.0000 g | Freq: Once | ORAL | Status: AC
  Administered 2015-12-20: 50 g via ORAL
  Filled 2015-12-20: qty 240

## 2015-12-20 MED ORDER — SODIUM CHLORIDE 0.9 % IV SOLN
1000.0000 mL | Freq: Once | INTRAVENOUS | Status: AC
  Administered 2015-12-20: 1000 mL via INTRAVENOUS
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
## 2015-12-20 NOTE — ED Notes (Signed)
[facility]  Pt states she took a "handful" of triliptol.  Pt states she does not know how much she took and she threw away the bottle before EMS arrived.  Complains of dizziness and nausea.  States SI.  Noncompliant with medications.
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
## 2015-12-20 NOTE — ED Notes (Signed)
Sitter at pt bedside.  Room inspected and secured.

## 2015-12-20 NOTE — ED Notes (Signed)
Pt allowed one phone call to family to let them know she is here.  She spoke with fiance.

## 2015-12-20 NOTE — ED Notes (Signed)
Pt provided with phone. Sitter at bedside. Pt drank 3/4 of charcoal. Encouraged to finish.

## 2015-12-20 NOTE — Progress Notes (Signed)
NP Josephine recommended psychiatric inpatient treatment for patient on 12/20/15. [facility].  Referrals have been sent to: [facility] - per [facility] - per [facility] - per [facility] SonAy[facility]  [facility]  [place]ia Everley [place], [place] Disposition staff 12/20/2015 10:50 PM

## 2015-12-20 NOTE — ED Notes (Signed)
Pt drank charcoal.

## 2015-12-20 NOTE — ED Notes (Signed)
Sitter at bedside. Pt c/o being nauseated. MD states pt cannot have anything for nausea at this time due to what pt took. Pt was notified of this and was encouraged to drink charcoal, once again.
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
## 2015-12-21 ENCOUNTER — Encounter (HOSPITAL_COMMUNITY): Payer: Self-pay

## 2015-12-21 ENCOUNTER — Inpatient Hospital Stay (HOSPITAL_COMMUNITY)
Admission: AD | Admit: 2015-12-21 | Discharge: 2015-12-26 | DRG: 885 | Disposition: A | Discharge status: Home / Self Care | Payer: Medicare Other | Source: Intra-hospital | Attending: Psychiatry | Admitting: Psychiatry

## 2015-12-21 DIAGNOSIS — Z915 Personal history of self-harm: Secondary | ICD-10-CM | POA: Diagnosis not present

## 2015-12-21 DIAGNOSIS — R45851 Suicidal ideations: Secondary | ICD-10-CM | POA: Diagnosis present

## 2015-12-21 DIAGNOSIS — F250 Schizoaffective disorder, bipolar type: Secondary | ICD-10-CM | POA: Diagnosis present

## 2015-12-21 DIAGNOSIS — F259 Schizoaffective disorder, unspecified: Secondary | ICD-10-CM | POA: Diagnosis present

## 2015-12-21 DIAGNOSIS — F17210 Nicotine dependence, cigarettes, uncomplicated: Secondary | ICD-10-CM | POA: Diagnosis present

## 2015-12-21 DIAGNOSIS — T1491 Suicide attempt: Secondary | ICD-10-CM | POA: Diagnosis not present

## 2015-12-21 DIAGNOSIS — F251 Schizoaffective disorder, depressive type: Secondary | ICD-10-CM

## 2015-12-21 DIAGNOSIS — T421X2A Poisoning by iminostilbenes, intentional self-harm, initial encounter: Secondary | ICD-10-CM | POA: Diagnosis not present

## 2015-12-21 MED ORDER — ZIPRASIDONE HCL 20 MG PO CAPS
40.0000 mg | ORAL_CAPSULE | Freq: Two times a day (BID) | ORAL | Status: DC
  Administered 2015-12-21: 40 mg via ORAL
  Filled 2015-12-21: qty 2

## 2015-12-21 MED ORDER — ZIPRASIDONE HCL 40 MG PO CAPS
40.0000 mg | ORAL_CAPSULE | Freq: Two times a day (BID) | ORAL | Status: DC
  Administered 2015-12-22 – 2015-12-26 (×9): 40 mg via ORAL
  Filled 2015-12-21 (×4): qty 1
  Filled 2015-12-21: qty 2
  Filled 2015-12-21 (×9): qty 1

## 2015-12-21 MED ORDER — BENZTROPINE MESYLATE 1 MG PO TABS
1.0000 mg | ORAL_TABLET | Freq: Two times a day (BID) | ORAL | Status: DC
  Administered 2015-12-22 – 2015-12-23 (×3): 1 mg via ORAL
  Filled 2015-12-21 (×8): qty 1

## 2015-12-21 MED ORDER — BENZTROPINE MESYLATE 1 MG PO TABS
1.0000 mg | ORAL_TABLET | Freq: Two times a day (BID) | ORAL | Status: DC
  Administered 2015-12-21 (×2): 1 mg via ORAL
  Filled 2015-12-21 (×2): qty 1

## 2015-12-21 MED ORDER — ACETAMINOPHEN 325 MG PO TABS: 650.0000 mg | ORAL_TABLET | Freq: Four times a day (QID) | ORAL | Status: DC | PRN

## 2015-12-21 NOTE — Consult Note (Signed)
[place] Psychiatry Consult   Reason for Consult:  Suicide attempt, Depression, Medication non compliant  Referring Physician:  EDP Patient Identification: Paula Little MRN:  [id number] Principal Diagnosis: Schizoaffective disorder, depressive type [facility]) Diagnosis:   Patient Active Problem List   Diagnosis Date Noted  . MDD (major depressive disorder), recurrent episode, moderate ([place]) [F33.1] 11/16/2015  . Schizoaffective disorder, bipolar type ([place]) [F25.0]   . GAD (generalized anxiety disorder) [F41.1]   . Schizoaffective disorder, depressive type ([place]) [F25.1] 11/07/2015  . Post traumatic stress disorder (PTSD) [F43.10] 09/22/2015  . Seizure disorder ([place]) [J18.841]   . Intentional overdose of drug in tablet form ([place]) [T50.902A] 08/06/2015  . Overdose [T50.901A] 08/06/2015  . Cervicitis [N72] 03/31/2013  . Chronic constipation [K59.00] 11/12/2012  . Chronic lower back pain [M54.5, G89.29] 11/12/2012  . Hot flashes [N95.1] 11/12/2012  . Kidney stones [N20.0]   . Anemia [D64.9]     Total Time spent with patient: 45 minutes  Subjective:   Paula Little is a 30 y.o. female patient admitted with Suicidal ideation, Depression, Medication non compliant.Marland Kitchen  HPI:  Caucasian female, 30 years old was evaluated after she came in with c/o of increased depression with Suicide attempt with Trileptal.  Patient was discharged from our [facility] last month for same reasons.  Patient reports that her stressors includes loosing her step grandmother yesterday.  Patient admits to not taking any Psychiatric medications and states she could not keep her appointments or refill her medications due to lack of transportation.  Patient was unable to quantify the Trileptal taken.  Patient  reports she took Trileptal prescribed by her Neurologist.  Patient reports she has cut her arm  and drank bleach in the past as suicide attempt.  Today she is unable to contract for safety and she has been  accepted for admission.  Past Psychiatric History:  Schizoaffective disorder, depressed, MDD, GAD, PTSD,   Risk to Self: Suicidal Ideation: Yes-Currently Present Suicidal Intent: Yes-Currently Present Is patient at risk for suicide?: Yes Suicidal Plan?: Yes-Currently Present Specify Current Suicidal Plan: Pt reports that she took a handfull of her seizure medication  Access to Means: Yes Specify Access to Suicidal Means: seizure medication What has been your use of drugs/alcohol within the last 12 months?: None Reported How many times?: 21 Other Self Harm Risks: Past history of cutting as a teenager Triggers for Past Attempts: Family contact, Other (Comment) Intentional Self Injurious Behavior: None Risk to Others: Homicidal Ideation: No Thoughts of Harm to Others: No Current Homicidal Intent: No Current Homicidal Plan: No Access to Homicidal Means: No Identified Victim: NA History of harm to others?: No Assessment of Violence: None Noted Violent Behavior Description: None Reported Does patient have access to weapons?: No Criminal Charges Pending?: No Does patient have a court date: No Prior Inpatient Therapy: Prior Inpatient Therapy: Yes Prior Therapy Dates: Multiple since pt was a teenager Prior Therapy Facilty/Provider(s): [facility] ("25 x as a teen"), [place], [place] Reason for Treatment: Bipolar, SI and attempts Prior Outpatient Therapy: Prior Outpatient Therapy: Yes Prior Therapy Dates: Ongoing Prior Therapy Facilty/Provider(s): [facility] Reason for Treatment: Med Mgt Does patient have an ACCT team?: No Does patient have Intensive In-House Services?  : No Does patient have [facility] services? : No Does patient have P4CC services?: No  Past Medical History:  Past Medical History  Diagnosis Date  . Kidney stones   . Anemia   . Chronic abdominal pain   . Depression   .  Anxiety   . Bipolar disorder (HCC)   . Seizures (HCC)     "havent had one in a while"   . PTSD (post-traumatic stress disorder)     Past Surgical History  Procedure Laterality Date  . Cholecystectomy    . Eye surgery     Family History:  Family History  Problem Relation Age of Onset  . Depression Mother   . Anxiety disorder Mother   . Bipolar disorder Mother    Family Psychiatric  History:   Denies Social History:  History  Alcohol Use  . Yes    Comment: 2 drinks every 3 months. Denies hx of DUI/DWI, detox and rehab     History  Drug Use  . Yes  . Special: "Crack" cocaine    Comment: last use in 2002- pt quit on her own    Social History   Social History  . Marital Status: Married    Spouse Name: N/A  . Number of Children: N/A  . Years of Education: N/A   Social History Main Topics  . Smoking status: Current Every Day Smoker -- 1.00 packs/day for 0 years    Types: Cigarettes  . Smokeless tobacco: Never Used  . Alcohol Use: Yes     Comment: 2 drinks every 3 months. Denies hx of DUI/DWI, detox and rehab  . Drug Use: Yes    Special: "Crack" cocaine     Comment: last use in 2002- pt quit on her own  . Sexual Activity: Yes    Birth Control/ Protection: None   Other Topics Concern  . None   Social History Narrative   Pt lives in [place] with fiance and his cousin. NO kids. Pt has a total of 8 half siblings. Pt is on disability and is unemployed. Pt completed 9th grade.    Additional Social History:    Allergies:   Allergies  Allergen Reactions  . Compazine [Prochlorperazine] Rash and Itching    Can take phenergan Can take phenergan  . Haldol [Haloperidol] Rash and Itching    Pt denies Can take phenergan  . Reglan [Metoclopramide] Hives, Itching and Rash  . Dicyclomine Hcl Rash  . Gold Rash  . Motrin [Ibuprofen] Hives and Other (See Comments)  . Other Hives, Itching and Rash    PLEASE USE FABRIC BANDAGES (NOT CLEAR TAPE) PLEASE USE FABRIC BANDAGES (NOT CLEAR TAPE) Medication:GI Cocktail  . Shellfish Allergy Rash  .  Shellfish-Derived Products Rash  . Ondansetron Itching  . Trazodone Other (See Comments)    Nightmares and bed wetting  . Aspirin Rash  . Donnatal [Belladonna Alk-Phenobarb Er] Itching and Rash  . Fluoxetine Rash  . Latex Rash  . Lidocaine Itching and Rash  . Maalox [Calcium Carbonate Antacid] Rash  . Mushroom Extract Complex Hives and Rash  . Orange Fruit [Citrus] Rash  . Pb-Hyoscy-Atropine-Scopol Er Rash  . Penicillins Hives and Rash    Has patient had a PCN reaction causing immediate rash, facial/tongue/throat swelling, SOB or lightheadedness with hypotension: Yes Has patient had a PCN reaction causing severe rash involving mucus membranes or skin necrosis: NO Has patient had a PCN reaction that required hospitalization Yes Has patient had a PCN reaction occurring within the last 10 years: YES If all of the above answers are "NO", then may proceed with Cephalosporin use.  . Prozac [Fluoxetine Hcl] Rash  . Toradol [Ketorolac Tromethamine] Rash  . Tramadol Rash  . Zofran [Ondansetron Hcl] Hives    Labs:  Results   for orders placed or performed during the hospital encounter of 12/20/15 (from the past 48 hour(s))  Urine rapid drug screen (hosp performed) (Not at [facility])     Status: None   Collection Time: 12/20/15  3:37 PM  Result Value Ref Range   Opiates NONE DETECTED NONE DETECTED   Cocaine NONE DETECTED NONE DETECTED   Benzodiazepines NONE DETECTED NONE DETECTED   Amphetamines NONE DETECTED NONE DETECTED   Tetrahydrocannabinol NONE DETECTED NONE DETECTED   Barbiturates NONE DETECTED NONE DETECTED    Comment:        DRUG SCREEN FOR MEDICAL PURPOSES ONLY.  IF CONFIRMATION IS NEEDED FOR ANY PURPOSE, NOTIFY LAB WITHIN 5 DAYS.        LOWEST DETECTABLE LIMITS FOR URINE DRUG SCREEN Drug Class       Cutoff (ng/mL) Amphetamine      1000 Barbiturate      200 Benzodiazepine   200 Tricyclics       300 Opiates          300 Cocaine          300 THC              50   Ethanol  (ETOH)     Status: None   Collection Time: 12/20/15  4:30 PM  Result Value Ref Range   Alcohol, Ethyl (B) <5 <5 mg/dL    Comment:        LOWEST DETECTABLE LIMIT FOR SERUM ALCOHOL IS 5 mg/dL FOR MEDICAL PURPOSES ONLY   Salicylate level     Status: None   Collection Time: 12/20/15  4:30 PM  Result Value Ref Range   Salicylate Lvl <4.0 2.8 - 30.0 mg/dL  Acetaminophen level     Status: Abnormal   Collection Time: 12/20/15  4:30 PM  Result Value Ref Range   Acetaminophen (Tylenol), Serum <10 (L) 10 - 30 ug/mL    Comment:        THERAPEUTIC CONCENTRATIONS VARY SIGNIFICANTLY. A RANGE OF 10-30 ug/mL MAY BE AN EFFECTIVE CONCENTRATION FOR MANY PATIENTS. HOWEVER, SOME ARE BEST TREATED AT CONCENTRATIONS OUTSIDE THIS RANGE. ACETAMINOPHEN CONCENTRATIONS >150 ug/mL AT 4 HOURS AFTER INGESTION AND >50 ug/mL AT 12 HOURS AFTER INGESTION ARE OFTEN ASSOCIATED WITH TOXIC REACTIONS.   Comprehensive metabolic panel     Status: Abnormal   Collection Time: 12/20/15  5:09 PM  Result Value Ref Range   Sodium 139 135 - 145 mmol/L   Potassium 3.6 3.5 - 5.1 mmol/L   Chloride 108 101 - 111 mmol/L   CO2 25 22 - 32 mmol/L   Glucose, Bld 95 65 - 99 mg/dL   BUN 8 6 - 20 mg/dL   Creatinine, Ser 0.56 0.44 - 1.00 mg/dL   Calcium 8.5 (L) 8.9 - 10.3 mg/dL   Total Protein 6.5 6.5 - 8.1 g/dL   Albumin 3.7 3.5 - 5.0 g/dL   AST 25 15 - 41 U/L   ALT 20 14 - 54 U/L   Alkaline Phosphatase 72 38 - 126 U/L   Total Bilirubin 0.2 (L) 0.3 - 1.2 mg/dL   GFR calc non Af Amer >60 >60 mL/min   GFR calc Af Amer >60 >60 mL/min    Comment: (NOTE) The eGFR has been calculated using the CKD EPI equation. This calculation has not been validated in all clinical situations. eGFR's persistently <60 mL/min signify possible Chronic Kidney Disease.    Anion gap 6 5 - 15  CBC     Status: Abnormal     Collection Time: 12/20/15  5:09 PM  Result Value Ref Range   WBC 6.0 4.0 - 10.5 K/uL   RBC 3.72 (L) 3.87 - 5.11 MIL/uL    Hemoglobin 11.4 (L) 12.0 - 15.0 g/dL   HCT 33.7 (L) 36.0 - 46.0 %   MCV 90.6 78.0 - 100.0 fL   MCH 30.6 26.0 - 34.0 pg   MCHC 33.8 30.0 - 36.0 g/dL   RDW 13.0 11.5 - 15.5 %   Platelets 175 150 - 400 K/uL  I-Stat Beta hCG blood, ED ([facility], [facility], [facility] only)     Status: None   Collection Time: 12/20/15  5:18 PM  Result Value Ref Range   I-stat hCG, quantitative <5.0 <5 mIU/mL   Comment 3            Comment:   GEST. AGE      CONC.  (mIU/mL)   <=1 WEEK        5 - 50     2 WEEKS       50 - 500     3 WEEKS       100 - 10,000     4 WEEKS     1,000 - 30,000        FEMALE AND NON-PREGNANT FEMALE:     LESS THAN 5 mIU/mL     Current Facility-Administered Medications  Medication Dose Route Frequency Provider Last Rate Last Dose  . 0.9 %  sodium chloride infusion  1,000 mL Intravenous Continuous Charlesetta Shanks, MD   Stopped at 12/20/15 2200   Current Outpatient Prescriptions  Medication Sig Dispense Refill  . Oxcarbazepine (TRILEPTAL) 300 MG tablet Take 300 mg by mouth 2 (two) times daily.    . promethazine (PHENERGAN) 25 MG tablet Take 1 tablet (25 mg total) by mouth every 6 (six) hours as needed for nausea or vomiting. 30 tablet 0  . ranitidine (ZANTAC) 75 MG tablet Take 1 tablet (75 mg total) by mouth 2 (two) times daily.    Marland Kitchen zolpidem (AMBIEN) 5 MG tablet Take 5 mg by mouth at bedtime as needed for sleep.    . hydrOXYzine (ATARAX/VISTARIL) 25 MG tablet Take 1 tablet (25 mg total) by mouth every 6 (six) hours as needed for anxiety. (Patient not taking: Reported on 12/20/2015) 30 tablet 0  . risperiDONE (RISPERDAL) 1 MG tablet Take 5 tablets (5 mg total) by mouth at bedtime. (Patient not taking: Reported on 12/20/2015) 150 tablet 0  . sucralfate (CARAFATE) 1 g tablet Take 1 tablet (1 g total) by mouth 4 (four) times daily -  with meals and at bedtime. (Patient not taking: Reported on 12/20/2015) 30 tablet 0  . zolpidem (AMBIEN) 10 MG tablet Take 1 tablet (10 mg total) by mouth at bedtime as needed for  sleep. (Patient taking differently: Take 10 mg by mouth at bedtime. ) 30 tablet 0    Musculoskeletal: Strength & Muscle Tone: within normal limits Gait & Station: normal Patient leans: N/A  Psychiatric Specialty Exam: Review of Systems  Constitutional: Negative.   HENT: Negative.   Eyes: Negative.   Respiratory: Negative.   Cardiovascular: Negative.   Gastrointestinal: Negative.   Genitourinary: Negative.   Musculoskeletal: Negative.   Skin: Negative.   Neurological: Negative.   Endo/Heme/Allergies: Negative.     Blood pressure 95/56, pulse 65, temperature 98.6 F (37 C), temperature source Oral, resp. rate 15, last menstrual period 12/20/2015, SpO2 98 %.There is no weight on file to calculate BMI.  General Appearance: Casual  and Fairly Groomed  Eye Contact::  Good  Speech:  Clear and Coherent and Normal Rate  Volume:  Normal  Mood:  Angry, Anxious, Depressed and Irritable  Affect:  Congruent, Depressed and Flat  Thought Process:  Coherent, Goal Directed and Intact  Orientation:  Full (Time, Place, and Person)  Thought Content:  WDL  Suicidal Thoughts:  Yes.  without intent/plan  Homicidal Thoughts:  No  Memory:  Immediate;   Good Recent;   Good Remote;   Good  Judgement:  Poor  Insight:  Shallow  Psychomotor Activity:  Normal  Concentration:  Good  Recall:  Good  Fund of Knowledge:Fair  Language: Good  Akathisia:  NA  Handed:  Right  AIMS (if indicated):     Assets:  Desire for Improvement  ADL's:  Intact  Cognition: WNL  Sleep:      Treatment Plan Summary: Daily contact with patient to assess and evaluate symptoms and progress in treatment and Medication management  Disposition:   Accepted for admission and we will be seeking placement at any facility with available bed.   We have resumed her home medications.    Josephine C Onuoha, NP   PMHNP-BC 12/21/2015 11:16 AM Patient seen face-to-face for psychiatric evaluation, chart reviewed and case discussed  with the physician extender and developed treatment plan. Reviewed the information documented and agree with the treatment plan.  , MD 
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
## 2015-12-21 NOTE — BH Assessment (Signed)
[facility] Assessment Progress Note  Per Thedore MinsMojeed Akintayo, MD, this pt requires psychiatric hospitalization at this time.  Lillia AbedLindsay, RN, [facility]  Pt is to be transported at 17:30.  Pt has signed Voluntary Admission and Consent for Treatment, as well as Consent to Release Information to her boyfriend, and signed [facility].  Pt's nurse, Diane, has been notified, and agrees to send original paperwork along with pt via [facility] [place], and to call report to [phone].  Doylene Canninghomas Arron Tetrault, MA Triage Specialist [phone]

## 2015-12-21 NOTE — ED Notes (Signed)
Pt reports that she continues to feel unsafe, afraid that she might take her own life. She has a history of depression and anxiety, and [facility] not have transportation for her follow-up appointments so she did not take her medications or see her counselor. She said that her step grandmother passed away yesterday and she also continues to grieve other family members who have died. She does not feel that she has friends and very little family that can help her.
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
## 2015-12-21 NOTE — ED Notes (Signed)
[facility] here for transport. 

## 2015-12-21 NOTE — Progress Notes (Signed)
Patient ID: Paula Little, female   DOB: [birth year], 30 y.o.   MRN: [id number] Per State regulations 482.30 this chart was reviewed for medical necessity with respect to the patient's admission/duration of stay.    Next review date:12/24/15  Thurman CoyerEric Terre Zabriskie, BSN, RN-BC  Case Manager

## 2015-12-21 NOTE — Tx Team (Signed)
Initial Interdisciplinary Treatment Plan   PATIENT STRESSORS: Loss of step grandmother Medication change or noncompliance   PATIENT STRENGTHS: Average or above average intelligence Capable of independent living Communication skills   PROBLEM LIST: Problem List/Patient Goals Date to be addressed Date deferred Reason deferred Estimated date of resolution  Depression 12/21/15     Anxiety 12/21/15     Suicidal attempt 12/21/15     "get back on my medication" 12/21/15     "stay positive while I'm here" 12/21/15                              DISCHARGE CRITERIA:  Improved stabilization in mood, thinking, and/or behavior Verbal commitment to aftercare and medication compliance  PRELIMINARY DISCHARGE PLAN: Outpatient therapy Medication management  PATIENT/FAMIILY INVOLVEMENT: This treatment plan has been presented to and reviewed with the patient, [name].  The patient and family have been given the opportunity to ask questions and make suggestions.  Norm ParcelHeather V Marlynn Hinckley 12/21/2015, 11:54 PM

## 2015-12-22 ENCOUNTER — Encounter (HOSPITAL_COMMUNITY): Payer: Self-pay | Admitting: Psychiatry

## 2015-12-22 DIAGNOSIS — F250 Schizoaffective disorder, bipolar type: Principal | ICD-10-CM

## 2015-12-22 MED ORDER — PHENYTOIN 50 MG PO CHEW
100.0000 mg | CHEWABLE_TABLET | Freq: Two times a day (BID) | ORAL | Status: DC
  Administered 2015-12-22 – 2015-12-23 (×2): 100 mg via ORAL
  Filled 2015-12-22 (×6): qty 2

## 2015-12-22 MED ORDER — ZOLPIDEM TARTRATE 5 MG PO TABS
5.0000 mg | ORAL_TABLET | Freq: Every evening | ORAL | Status: DC | PRN
  Administered 2015-12-22 – 2015-12-25 (×4): 5 mg via ORAL
  Filled 2015-12-22 (×4): qty 1

## 2015-12-22 NOTE — Tx Team (Signed)
Interdisciplinary Treatment Plan Update (Adult) Date: 12/22/2015   Date: 12/22/2015 2:01 PM  Progress in Treatment:  Attending groups: Pt is new to milieu, continuing to assess  Participating in groups: Pt is new to milieu, continuing to assess  Taking medication as prescribed: Yes  Tolerating medication: Yes  Family/Significant othe contact made: No, CSW attempting to make contact with boyfriend Patient understands diagnosis: Yes AEB seeking help with depression Discussing patient identified problems/goals with staff: Yes  Medical problems stabilized or resolved: Yes  Denies suicidal/homicidal ideation: Pt recently admitted with SI Patient has not harmed self or Others: Yes   New problem(s) identified: None identified at this time.   Discharge Plan or Barriers: Pt will return home and follow-up with [facility] in [place].  Additional comments:  Patient and CSW reviewed pt's identified goals and treatment plan. Patient verbalized understanding and agreed to treatment plan. CSW reviewed [facility] "Discharge Process and Patient Involvement" Form. Pt verbalized understanding of information provided and signed form.   Reason for Continuation of Hospitalization:  Depression Medical Issues Medication stabilization  Estimated length of stay: 3-5 days  Review of initial/current patient goals per problem list:   1.  Goal(s): Patient will participate in aftercare plan  Met:  Yes  Target date: 3-5 days from date of admission   As evidenced by: Patient will participate within aftercare plan AEB aftercare provider and housing plan at discharge being identified.   12/22/15: Pt will return home and follow-up with [facility] in [place].  2.  Goal (s): Patient will exhibit decreased depressive symptoms and suicidal ideations.  Met:   No  Target date: 3-5 days from date of admission   As evidenced by: Patient will utilize self rating of depression at 3 or below and demonstrate decreased signs of  depression or be deemed stable for discharge by MD. 12/22/15: Pt was admitted with symptoms of depression, rating 10/10. Pt continues to present with flat affect and depressive symptoms.  Pt will demonstrate decreased symptoms of depression and rate depression at 3/10 or lower prior to discharge.  Attendees:  Patient:    Family:    Physician: Dr. Parke Poisson, MD  12/22/2015 2:01 PM  Nursing: Lars Pinks, RN Case manager  12/22/2015 2:01 PM  Clinical Social Worker Peri Maris, [place] 12/22/2015 2:01 PM  Other: Tilden Fossa, [facility] 12/22/2015 2:01 PM  Clinical: Marcella Dubs, RN; Gaylan Gerold, RN 12/22/2015 2:01 PM  Other: , RN Charge Nurse 12/22/2015 2:01 PM  Other:     Peri Maris, [place] Social Work [phone]
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
## 2015-12-22 NOTE — [facility] Group Notes (Signed)
[facility] 12/22/2015 1:15pm  Type of Therapy: Mental Health Association Presentation  Pt did not attend, declined invitation.    Chad CordialLauren Carter, LCSWA 12/22/2015 1:56 PM

## 2015-12-22 NOTE — Progress Notes (Signed)
Patient ID: [name], female   DOB: Oct 26, 1985, 30 y.o.   MRN: [id number]  [facility]. D: Client in bed, eyes closed respirations even, unlabored. A: Writer observed client, no distress noted. Staff will monitor q2215min for safety. R: Client is safe on the unit.

## 2015-12-22 NOTE — [facility] Suicide Risk Assessment (Signed)
[facility]   Nursing information obtained from:  Patient Demographic factors:  Caucasian, Low socioeconomic status, Unemployed Current Mental Status:  NA Loss Factors:  Financial problems / change in socioeconomic status Historical Factors:  Prior suicide attempts Risk Reduction Factors:  Living with another person, especially a relative  Total Time spent with patient: 45 minutes Principal Problem: Schizoaffective Disorder  Diagnosis:   Patient Active Problem List   Diagnosis Date Noted  . MDD (major depressive disorder), recurrent episode, moderate (HCC) [F33.1] 11/16/2015  . Schizoaffective disorder, bipolar type (HCC) [F25.0]   . GAD (generalized anxiety disorder) [F41.1]   . Schizoaffective disorder, depressive type (HCC) [F25.1] 11/07/2015  . Post traumatic stress disorder (PTSD) [F43.10] 09/22/2015  . Seizure disorder (HCC) [G40.909]   . Intentional overdose of drug in tablet form (HCC) [T50.902A] 08/06/2015  . Overdose [T50.901A] 08/06/2015  . Cervicitis [N72] 03/31/2013  . Chronic constipation [K59.00] 11/12/2012  . Chronic lower back pain [M54.5, G89.29] 11/12/2012  . Hot flashes [N95.1] 11/12/2012  . Kidney stones [N20.0]   . Anemia [D64.9]      Continued Clinical Symptoms:  Alcohol Use Disorder Identification Test Final Score (AUDIT): 1 The "Alcohol Use Disorders Identification Test", Guidelines for Use in Primary Care, Second Edition.  World Science writerHealth Organization [facility](WHO). Score between 0-7:  no or low risk or alcohol related problems. Score between 8-15:  moderate risk of alcohol related problems. Score between 16-19:  high risk of alcohol related problems. Score 20 or above:  warrants further diagnostic evaluation for alcohol dependence and treatment.   CLINICAL FACTORS:  30 year old female, known to our unit from prior admissions, history of Schizoaffective Disorder, history of  Prior suicidal attempts, recent overdose on Trileptal ,  after finding out her grandmother had passed away      Psychiatric Specialty Exam: ROS  Blood pressure 99/63, pulse 79, temperature 98.3 F (36.8 C), temperature source Oral, resp. rate 16, height 5\' 1"  (1.549 m), weight 209 lb (94.802 kg), last menstrual period 12/20/2015, SpO2 100 %.Body mass index is 39.51 kg/(m^2).   see admit note MSE                                                       COGNITIVE FEATURES THAT CONTRIBUTE TO RISK:  Closed-mindedness and Loss of executive function    SUICIDE RISK:   Moderate:  Frequent suicidal ideation with limited intensity, and duration, some specificity in terms of plans, no associated intent, good self-control, limited dysphoria/symptomatology, some risk factors present, and identifiable protective factors, including available and accessible social support.  PLAN OF CARE: Patient will be admitted to inpatient psychiatric unit for stabilization and safety. Will provide and encourage milieu participation. Provide medication management and maked adjustments as needed.  Will follow daily.    I certify that inpatient services furnished can reasonably be expected to improve the patient's condition.   Nehemiah MassedOBOS, Tichina Koebel, MD 12/22/2015, 5:46 PM

## 2015-12-22 NOTE — Progress Notes (Signed)
Adult Psychoeducational Group Note  Date:  12/22/2015 Time:  10:58 PM  Group Topic/Focus:  Wrap-Up Group:   The focus of this group is to help patients review their daily goal of treatment and discuss progress on daily workbooks.  Participation Level:  Active  Participation Quality:  Appropriate  Affect:  Appropriate  Cognitive:  Alert  Insight: Appropriate  Engagement in Group:  Engaged  Modes of Intervention:  Discussion  Additional Comments:  Patient states "My day was good". Patient goal for today was to try to stay positive and get back on medication.  Paula Little L Paula Little 12/22/2015, 10:58 PM
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
## 2015-12-22 NOTE — Progress Notes (Signed)
Patient ID: Paula Little, female   DOB: 1985/09/27, 30 y.o.   MRN: [id number] D: Client visible on the unit, seen in dayroom watching TV, interacts appropriately with peers and staff. Client reports "having suicidal thoughts off and on" depression "7" of 10. Client contracts for safety. A: Writer encouraged client to reports to staff if she does not feel safe. Medications reviewed, administered as prescribed. Staff will monitor q6215min for safety. R: Client is safe on the unit, attended group.

## 2015-12-22 NOTE — Progress Notes (Signed)
Patient ID: Paula Little, female   DOB: 05-29-1986, 30 y.o.   MRN: [id number]   Pt currently presents with a flat affect and anxious behavior. Per self inventory, pt rates depression at a 7, hopelessness 6 and anxiety 6. Pt's daily goal is to "get back on my meds" and they intend to do so by "talk to the doctor." Pt reports fair sleep, a fair appetite, normal energy and good concentration. Pt concerned about being started on a sleep medication tonight. States "I was taking Ambien last time I was here, think he will start me on it again?"  Pt provided with medications per providers orders. Pt's labs and vitals were monitored throughout the day. Pt supported emotionally and encouraged to express concerns and questions. Pt educated on medications.  Pt's safety ensured with 15 minute and environmental checks. Pt endorses passive SI, no current plan. Pt currently denies HI and A/V hallucinations. Pt verbally agrees to seek staff if HI or A/VH occurs and to consult with staff before acting on harmful thoughts. Will continue POC.

## 2015-12-23 LAB — LIPID PANEL
Cholesterol: 188 mg/dL (ref 0–200)
HDL: 61 mg/dL (ref >40)
LDL CALC: 98 mg/dL (ref 0–99)
TRIGLYCERIDES: 147 mg/dL (ref <150)
Total CHOL/HDL Ratio: 3.1 RATIO
VLDL: 29 mg/dL (ref 0–40)

## 2015-12-23 MED ORDER — LAMOTRIGINE 25 MG PO TABS
25.0000 mg | ORAL_TABLET | Freq: Every day | ORAL | Status: DC
  Administered 2015-12-23 – 2015-12-26 (×4): 25 mg via ORAL
  Filled 2015-12-23 (×6): qty 1

## 2015-12-23 MED ORDER — PHENYTOIN SODIUM EXTENDED 100 MG PO CAPS
100.0000 mg | ORAL_CAPSULE | Freq: Three times a day (TID) | ORAL | Status: DC
  Administered 2015-12-23 – 2015-12-26 (×10): 100 mg via ORAL
  Filled 2015-12-23 (×15): qty 1

## 2015-12-23 MED ORDER — PHENYTOIN SODIUM EXTENDED 100 MG PO CAPS
100.0000 mg | ORAL_CAPSULE | Freq: Two times a day (BID) | ORAL | Status: DC
  Filled 2015-12-23 (×4): qty 1

## 2015-12-23 MED ORDER — BENZTROPINE MESYLATE 0.5 MG PO TABS
0.5000 mg | ORAL_TABLET | Freq: Every day | ORAL | Status: DC
  Administered 2015-12-24 – 2015-12-26 (×3): 0.5 mg via ORAL
  Filled 2015-12-23 (×5): qty 1

## 2015-12-23 NOTE — Progress Notes (Signed)
[facility] MD Progress Note  12/23/2015 12:54 PM Paula Little  MRN:  [id number] Subjective:  She states she remains depressed, " about the same". Does present with improved range of affect. Denies medication side effects at this time. Objective : I have discussed case with treatment team and have met with patient. Patient reports ongoing sense of depression, but affect is more reactive. Denies any suicidal ideations . Denies medication side effects. At this time no akathisia or abnormal involuntary movements. Has history of seizure disorder, but has had none recently. States she has been taking Dilantin at 100 mgrs TID without side effects, although acknowledges compliance has been sub-optimal recently . No disruptive or agitated behaviors on unit, going to some groups , noted to be interacting appropriately with selected peers  Labs- Lipid panel WNL  Principal Problem: Schizoaffective disorder, bipolar type ([place]) Diagnosis:   Patient Active Problem List   Diagnosis Date Noted  . MDD (major depressive disorder), recurrent episode, moderate ([place]) [F33.1] 11/16/2015  . Schizoaffective disorder, bipolar type ([place]) [F25.0]   . GAD (generalized anxiety disorder) [F41.1]   . Schizoaffective disorder, depressive type ([place]) [F25.1] 11/07/2015  . Post traumatic stress disorder (PTSD) [F43.10] 09/22/2015  . Seizure disorder ([place]) [E83.151]   . Intentional overdose of drug in tablet form ([place]) [T50.902A] 08/06/2015  . Overdose [T50.901A] 08/06/2015  . Cervicitis [N72] 03/31/2013  . Chronic constipation [K59.00] 11/12/2012  . Chronic lower back pain [M54.5, G89.29] 11/12/2012  . Hot flashes [N95.1] 11/12/2012  . Kidney stones [N20.0]   . Anemia [D64.9]    Total Time spent with patient: 20 minutes    Past Medical History:  Past Medical History  Diagnosis Date  . Kidney stones   . Anemia   . Chronic abdominal pain   . Depression   . Anxiety   . Bipolar disorder ([place])   . Seizures ([place])      "havent had one in a while"  . PTSD (post-traumatic stress disorder)     Past Surgical History  Procedure Laterality Date  . Cholecystectomy    . Eye surgery     Family History:  Family History  Problem Relation Age of Onset  . Depression Mother   . Anxiety disorder Mother   . Bipolar disorder Mother     Social History:  History  Alcohol Use  . Yes    Comment: 2 drinks every 3 months. Denies hx of DUI/DWI, detox and rehab     History  Drug Use  . Yes  . Special: "Crack" cocaine    Comment: last use in 2002- pt quit on her own    Social History   Social History  . Marital Status: Married    Spouse Name: N/A  . Number of Children: N/A  . Years of Education: N/A   Social History Main Topics  . Smoking status: Current Every Day Smoker -- 1.00 packs/day for 0 years    Types: Cigarettes  . Smokeless tobacco: Never Used  . Alcohol Use: Yes     Comment: 2 drinks every 3 months. Denies hx of DUI/DWI, detox and rehab  . Drug Use: Yes    Special: "Crack" cocaine     Comment: last use in 2002- pt quit on her own  . Sexual Activity: Yes    Birth Control/ Protection: None   Other Topics Concern  . None   Social History Narrative   Pt lives in [place] with fiance and his cousin. NO kids. Pt has  a total of 8 half siblings. Pt is on disability and is unemployed. Pt completed 9th grade.    Additional Social History:   Sleep: Good  Appetite:  Good  Current Medications: Current Facility-Administered Medications  Medication Dose Route Frequency Provider Last Rate Last Dose  . acetaminophen (TYLENOL) tablet 650 mg  650 mg Oral Q6H PRN Delfin Gant, NP      . Derrill Memo ON 12/24/2015] benztropine (COGENTIN) tablet 0.5 mg  0.5 mg Oral Daily Jenne Campus, MD      . lamoTRIgine (LAMICTAL) tablet 25 mg  25 mg Oral Daily Jenne Campus, MD      . phenytoin (DILANTIN) ER capsule 100 mg  100 mg Oral TID Jenne Campus, MD      . ziprasidone (GEODON) capsule 40  mg  40 mg Oral BID WC Delfin Gant, NP   40 mg at 12/23/15 5621  . zolpidem (AMBIEN) tablet 5 mg  5 mg Oral QHS PRN Jenne Campus, MD   5 mg at 12/22/15 2206    Lab Results:  Results for orders placed or performed during the hospital encounter of 12/21/15 (from the past 48 hour(s))  Lipid panel     Status: None   Collection Time: 12/23/15  6:11 AM  Result Value Ref Range   Cholesterol 188 0 - 200 mg/dL   Triglycerides 147 <150 mg/dL   HDL 61 >40 mg/dL   Total CHOL/HDL Ratio 3.1 RATIO   VLDL 29 0 - 40 mg/dL   LDL Cholesterol 98 0 - 99 mg/dL    Comment:        Total Cholesterol/HDL:CHD Risk Coronary Heart Disease Risk Table                     Men   Women  1/2 Average Risk   3.4   3.3  Average Risk       5.0   4.4  2 X Average Risk   9.6   7.1  3 X Average Risk  23.4   11.0        Use the calculated Patient Ratio above and the CHD Risk Table to determine the patient's CHD Risk.        ATP III CLASSIFICATION (LDL):  <100     mg/dL   Optimal  100-129  mg/dL   Near or Above                    Optimal  130-159  mg/dL   Borderline  160-189  mg/dL   High  >190     mg/dL   Very High Performed at [facility]     Blood Alcohol level:  Lab Results  Component Value Date   [facility] <5 12/20/2015   ETH <5 11/15/2015    Physical Findings: AIMS: Facial and Oral Movements Muscles of Facial Expression: None, normal Lips and Perioral Area: None, normal Jaw: None, normal Tongue: None, normal,Extremity Movements Upper (arms, wrists, hands, fingers): None, normal Lower (legs, knees, ankles, toes): None, normal, Trunk Movements Neck, shoulders, hips: None, normal, Overall Severity Severity of abnormal movements (highest score from questions above): None, normal Incapacitation due to abnormal movements: None, normal Patient's awareness of abnormal movements (rate only patient's report): No Awareness, Dental Status Current problems with teeth and/or dentures?: No Does  patient usually wear dentures?: No  CIWA:    COWS:     Musculoskeletal: Strength & Muscle Tone: within normal limits  Gait & Station: normal Patient leans: N/A  Psychiatric Specialty Exam: ROS denies headache, denies chest pain , denies shortness of breath, denies vomiting  Blood pressure 103/69, pulse 92, temperature 98.2 F (36.8 C), temperature source Oral, resp. rate 18, height '5\' 1"'$  (1.549 m), weight 209 lb (94.802 kg), last menstrual period 12/20/2015, SpO2 100 %.Body mass index is 39.51 kg/(m^2).  General Appearance: improved grooming   Eye Contact::  Good  Speech:  Normal Rate  Volume:  Normal  Mood:  reports ongoing depression   Affect:  improved,less constricted, smiles at times appropriately  Thought Process:  Linear  Orientation:  Full (Time, Place, and Person)  Thought Content:  denies hallucinations, no delusions, not internally preoccupied   Suicidal Thoughts:  No- At this time denies any suicidal ideations and denies any self injurious ideations- contracts for safety on unit  Homicidal Thoughts:  No denies any homicidal ideations   Memory:  recent and remote grossly intact   Judgement:  Other:  improving   Insight:  improving   Psychomotor Activity:  Normal  Concentration:  Good  Recall:  Good  Fund of Knowledge:Good  Language: Good  Akathisia:  Negative  Handed:  Right  AIMS (if indicated):     Assets:  Desire for Improvement Resilience  ADL's:  Intact  Cognition: WNL  Sleep:  Number of Hours: 6.5  Assessment - patient reports ongoing depression, but affect presents improved, more reactive. No Suicidal ideations at this time and able to contract for safety on the unit at present. Tolerating medications well at this time. Has history of seizure disorder, for which she has taken Dilantin without side effects.  We discussed medication options - thus far tolerating Geodon trial well. Agrees to adding Lamictal to address Bipolar depression- has not been on this  medication in the past, we reviewed side effect profile, risk of severe rash. Treatment Plan Summary: Daily contact with patient to assess and evaluate symptoms and progress in treatment, Medication management, Plan inpatient admission and medications as below  Encourage ongoing group, milieu participation to work on coping skills and symptom reduction Continue  Geodon 40 mgrs BID for mood disorder  Start Lamictal 25 mgrs QDAY for mood disorder/depression  Increase Dilantin to 100 mgrs TID for management of seizure disorder  Decrease Cogentin to 0.5 mgrs QDAY as patient having no EPS or antipsychotic related side effects.  Continue Ambien 5 mgrs QHS PRN for insomnia    Neita Garnet, MD 12/23/2015, 12:54 PM

## 2015-12-23 NOTE — Progress Notes (Signed)
Patient ID: Paula Little, female   DOB: 28-Apr-1986, 30 y.o.   MRN: [id number] D: Client visible on the unit, reports depression "6" of 10. Goal today "be more positive" "I feel like I met that goal" "I don't feel no anxiety til I get in a crowd of people, I like people, but don't like to be standing around in a crowd" A: Writer encouraged client to engage according to her comfort level. Medication reviewed and administered as ordered. Staff will monitor q34mn for safety. R: Client is safe on the unit.

## 2015-12-23 NOTE — [facility] Group Notes (Signed)
[facility]  12/23/2015 8:45 AM  Participation Quality: Alert, Appropriate and Oriented  Mood/Affect: Appropriate  Depression Rating: 7-8  Anxiety Rating: 4-5  Thoughts of Suicide: Pt reported having SI earlier in the morning but denies currently  Will you contract for safety? Yes  Current AVH: Pt denies  Plan for Discharge/Comments: Pt attended discharge planning group and actively participated in group. CSW discussed suicide prevention education with the group and encouraged them to discuss discharge planning and any relevant barriers. Pt reports that depression has remained the same since admission; [facility]  Transportation Means: Pt reports access to transportation  Supports: No supports mentioned at this time  Chad CordialLauren Carter, LCSWA 12/23/2015 10:00AM

## 2015-12-23 NOTE — Progress Notes (Addendum)
Paula Little is adjusting well to being in the hospital. She takes her dilantin as planned and she is started on lamictal 25 mg today, as well. She attends her groups and remains flat, depressed.  A SHe completes her daily assessment and on it she wrote she has experienced SI today but she contracts with this nurse to not act on these thoughts  And she rates her depression, hopelessness and anxiety " 7/5/3", respectively. R Safety is in place and poc cont.

## 2015-12-23 NOTE — Progress Notes (Signed)
Adult Psychoeducational Group Note  Date:  12/23/2015 Time:  9:11 PM  Group Topic/Focus:  Wrap-Up Group:   The focus of this group is to help patients review their daily goal of treatment and discuss progress on daily workbooks.  Participation Level:  Active  Participation Quality:  Appropriate  Affect:  Appropriate  Cognitive:  Alert  Insight: Appropriate  Engagement in Group:  Engaged  Modes of Intervention:  Discussion  Additional Comments:  Pt stated that she had an ok day. Her goal was to stay positive.  Kaleen OdeaCOOKE, Ulyess Muto R 12/23/2015, 9:11 PM

## 2015-12-23 NOTE — [facility] Group Notes (Signed)
[facility] LCSW Group Therapy 12/23/2015 1:15pm  Type of Therapy: Group Therapy- Feelings Around Relapse and Recovery  Pt did not attend, declined invitation.    Chad CordialLauren Carter, Theresia MajorsLCSWA [phone] 12/23/2015 4:14 PM

## 2015-12-24 LAB — HEMOGLOBIN A1C
Hgb A1c MFr Bld: 5.3 % (ref 4.8–5.6)
Mean Plasma Glucose: 105 mg/dL

## 2015-12-24 LAB — PROLACTIN: Prolactin: 91.2 ng/mL — ABNORMAL HIGH (ref 4.8–23.3)

## 2015-12-24 NOTE — [facility] Group Notes (Signed)
[place] Group Notes:  (Nursing/MHT/Case Management/Adjunct)  Date:  12/24/2015  Time:  1315  Type of Therapy:  Nurse Education  /  Life SKills :  The group focuses on teaching patients how to get their needs met as well as tools needed that will help them get their needs met.  Participation Level:  Active  Participation Quality:  Attentive  Affect:  Appropriate  Cognitive:  Alert  Insight:  Appropriate  Engagement in Group:  Engaged  Modes of Intervention:  Education  Summary of Progress/Problems:  Lauralyn Primes 12/24/2015, 3:19 PM

## 2015-12-24 NOTE — Progress Notes (Signed)
[name] is seen OOB UAL on the 400 hall today...she is still quiet, flat and blunted..: little more talkative  this morning and taking all of her medications as planned. A. She remarks that the physician and she discussed discontinuing her cogentin yesterday. This Clinical research associatewriter will ask physician. R Safety in place and poc cont.

## 2015-12-24 NOTE — Progress Notes (Signed)
Patient ID: Paula Little, female   DOB: 1986/01/19, 30 y.o.   MRN: [id number] [facility] MD Progress Note  12/24/2015 2:37 PM [place]  MRN:  [id number] Subjective:  She reports she is feeling " a little better " today . She states , however, that she does continue to feel depressed (  but to a lesser degree than prior to admission), and complains of low energy level.  Continues to endorse intermittent passive thoughts of death, dying , but denies any suicidal plan or intention and is future oriented, for example stating she misses her fiance and is looking forward to seeing him soon . At this time denies medication side effects.  Objective : I have discussed case with treatment team and have met with patient. Patient gradually improving compared to admission- less depressed, affect more reactive. She describes some passive suicidal ideations, but denies any plan or intention of  Hurting self. She has been going to some groups, visible in milieu, cooperative, calm.  Denies medication side effects. Labs- Lipid panel unremarkable, HgbA1C 5. 3, prolactin elevated .    Principal Problem: Schizoaffective disorder, bipolar type ([place]) Diagnosis:   Patient Active Problem List   Diagnosis Date Noted  . MDD (major depressive disorder), recurrent episode, moderate ([place]) [F33.1] 11/16/2015  . Schizoaffective disorder, bipolar type ([place]) [F25.0]   . GAD (generalized anxiety disorder) [F41.1]   . Schizoaffective disorder, depressive type ([place]) [F25.1] 11/07/2015  . Post traumatic stress disorder (PTSD) [F43.10] 09/22/2015  . Seizure disorder ([place]) [Y85.027]   . Intentional overdose of drug in tablet form ([place]) [T50.902A] 08/06/2015  . Overdose [T50.901A] 08/06/2015  . Cervicitis [N72] 03/31/2013  . Chronic constipation [K59.00] 11/12/2012  . Chronic lower back pain [M54.5, G89.29] 11/12/2012  . Hot flashes [N95.1] 11/12/2012  . Kidney stones [N20.0]   . Anemia [D64.9]    Total Time spent  with patient: 20 minutes    Past Medical History:  Past Medical History  Diagnosis Date  . Kidney stones   . Anemia   . Chronic abdominal pain   . Depression   . Anxiety   . Bipolar disorder ([place])   . Seizures ([place])     "havent had one in a while"  . PTSD (post-traumatic stress disorder)     Past Surgical History  Procedure Laterality Date  . Cholecystectomy    . Eye surgery     Family History:  Family History  Problem Relation Age of Onset  . Depression Mother   . Anxiety disorder Mother   . Bipolar disorder Mother     Social History:  History  Alcohol Use  . Yes    Comment: 2 drinks every 3 months. Denies hx of DUI/DWI, detox and rehab     History  Drug Use  . Yes  . Special: "Crack" cocaine    Comment: last use in 2002- pt quit on her own    Social History   Social History  . Marital Status: Married    Spouse Name: N/A  . Number of Children: N/A  . Years of Education: N/A   Social History Main Topics  . Smoking status: Current Every Day Smoker -- 1.00 packs/day for 0 years    Types: Cigarettes  . Smokeless tobacco: Never Used  . Alcohol Use: Yes     Comment: 2 drinks every 3 months. Denies hx of DUI/DWI, detox and rehab  . Drug Use: Yes    Special: "Crack" cocaine     Comment:  last use in 2002- pt quit on her own  . Sexual Activity: Yes    Birth Control/ Protection: None   Other Topics Concern  . None   Social History Narrative   Pt lives in [place] with fiance and his cousin. NO kids. Pt has a total of 8 half siblings. Pt is on disability and is unemployed. Pt completed 9th grade.    Additional Social History:   Sleep: Good  Appetite:  Good  Current Medications: Current Facility-Administered Medications  Medication Dose Route Frequency Provider Last Rate Last Dose  . acetaminophen (TYLENOL) tablet 650 mg  650 mg Oral Q6H PRN Delfin Gant, NP      . benztropine (COGENTIN) tablet 0.5 mg  0.5 mg Oral Daily Myer Peer Willer [place], MD    0.5 mg at 12/24/15 0757  . lamoTRIgine (LAMICTAL) tablet 25 mg  25 mg Oral Daily Jenne Campus, MD   25 mg at 12/24/15 0757  . phenytoin (DILANTIN) ER capsule 100 mg  100 mg Oral TID Jenne Campus, MD   100 mg at 12/24/15 0757  . ziprasidone (GEODON) capsule 40 mg  40 mg Oral BID WC Delfin Gant, NP   40 mg at 12/24/15 0757  . zolpidem (AMBIEN) tablet 5 mg  5 mg Oral QHS PRN Jenne Campus, MD   5 mg at 12/23/15 2131    Lab Results:  Results for orders placed or performed during the hospital encounter of 12/21/15 (from the past 48 hour(s))  Lipid panel     Status: None   Collection Time: 12/23/15  6:11 AM  Result Value Ref Range   Cholesterol 188 0 - 200 mg/dL   Triglycerides 147 <150 mg/dL   HDL 61 >40 mg/dL   Total CHOL/HDL Ratio 3.1 RATIO   VLDL 29 0 - 40 mg/dL   LDL Cholesterol 98 0 - 99 mg/dL    Comment:        Total Cholesterol/HDL:CHD Risk Coronary Heart Disease Risk Table                     Men   Women  1/2 Average Risk   3.4   3.3  Average Risk       5.0   4.4  2 X Average Risk   9.6   7.1  3 X Average Risk  23.4   11.0        Use the calculated Patient Ratio above and the CHD Risk Table to determine the patient's CHD Risk.        ATP III CLASSIFICATION (LDL):  <100     mg/dL   Optimal  100-129  mg/dL   Near or Above                    Optimal  130-159  mg/dL   Borderline  160-189  mg/dL   High  >190     mg/dL   Very High Performed at [facility]   Prolactin     Status: Abnormal   Collection Time: 12/23/15  6:11 AM  Result Value Ref Range   Prolactin 91.2 (H) 4.8 - 23.3 ng/mL    Comment: (NOTE) Performed At: [facility] [place], [place] [id number] Lindon Romp MD [id number] Performed at [facility]   Hemoglobin A1c     Status: None   Collection Time: 12/23/15  6:11 AM  Result Value Ref Range  Hgb A1c MFr Bld 5.3 4.8 - 5.6 %    Comment: (NOTE)         Pre-diabetes: 5.7 - 6.4          Diabetes: >6.4         Glycemic control for adults with diabetes: <7.0    Mean Plasma Glucose 105 mg/dL    Comment: (NOTE) Performed At: [facility] [place], [place] [id number] Lindon Romp MD BJ:[id number] Performed at [facility]     Blood Alcohol level:  Lab Results  Component Value Date   [facility] <5 12/20/2015   ETH <5 11/15/2015    Physical Findings: AIMS: Facial and Oral Movements Muscles of Facial Expression: None, normal Lips and Perioral Area: None, normal Jaw: None, normal Tongue: None, normal,Extremity Movements Upper (arms, wrists, hands, fingers): None, normal Lower (legs, knees, ankles, toes): None, normal, Trunk Movements Neck, shoulders, hips: None, normal, Overall Severity Severity of abnormal movements (highest score from questions above): None, normal Incapacitation due to abnormal movements: None, normal Patient's awareness of abnormal movements (rate only patient's report): No Awareness, Dental Status Current problems with teeth and/or dentures?: No Does patient usually wear dentures?: No  CIWA:    COWS:     Musculoskeletal: Strength & Muscle Tone: within normal limits Gait & Station: normal Patient leans: N/A  Psychiatric Specialty Exam: ROS denies headache, denies chest pain , denies shortness of breath, denies vomiting  Blood pressure 94/68, pulse 89, temperature 98.3 F (36.8 C), temperature source Oral, resp. rate 16, height '5\' 1"'$  (1.549 m), weight 209 lb (94.802 kg), last menstrual period 12/20/2015, SpO2 100 %.Body mass index is 39.51 kg/(m^2).  General Appearance: improved grooming   Eye Contact::  Good  Speech:  Normal Rate  Volume:  Normal  Mood: remains depressed, but has improved compared to admission   Affect: less constricted  Thought Process:  Linear  Orientation:  Full (Time, Place, and Person)  Thought Content:  denies hallucinations, no delusions, not internally preoccupied    Suicidal Thoughts:  No- At this time denies any suicidal ideations and denies any self injurious ideations- contracts for safety on unit  Homicidal Thoughts:  No denies any homicidal ideations   Memory:  recent and remote grossly intact   Judgement:  Other:  improving   Insight:  improving   Psychomotor Activity:  Normal  Concentration:  Good  Recall:  Good  Fund of Knowledge:Good  Language: Good  Akathisia:  Negative  Handed:  Right  AIMS (if indicated):     Assets:  Desire for Improvement Resilience  ADL's:  Intact  Cognition: WNL  Sleep:  Number of Hours: 6.5  Assessment - patient reports persistent depressive symptoms but does acknowledge some improvement and presents with a a fuller range of affect. At this time tolerating Geodon and Lamictal well . Has history of seizure disorder, but has had no seizures on unit- on Dilantin , which she has been on for years without reported side effects.  Treatment Plan Summary: Daily contact with patient to assess and evaluate symptoms and progress in treatment, Medication management, Plan inpatient admission and medications as below  Encourage ongoing group, milieu participation to work on coping skills and symptom reduction Continue  Geodon 40 mgrs BID for mood disorder  Continue  Lamictal 25 mgrs QDAY for mood disorder/depression  Continue  Dilantin to 100 mgrs TID for management of seizure disorder  Continue Cogentin to 0.5 mgrs QDAY to minimize risk of neuroleptic  side effects  Continue Ambien 5 mgrs QHS PRN for insomnia  Treatment team working on disposition planning, patient wants to return home on discharge Neita Garnet, MD 12/24/2015, 2:37 PM

## 2015-12-24 NOTE — [facility] Group Notes (Signed)
[facility] LCSW Group Therapy  12/24/2015 10:00 AM  Type of Therapy:  Processing  Participation Level:  Active  Participation Quality:  Appropriate and Attentive  Affect:  Appropriate and Flat  Cognitive:  Alert, Appropriate and Oriented  Insight:  Developing/Improving  Engagement in Therapy:  Engaged and Supportive  Modes of Intervention:  Discussion  Summary of Progress/Problems: Group discussed systems of care. Group discussion looked at the system including self, close network and extended network. Close network included nearby or almost daily contacts. Extended network included weekly/semiweekly contact, which can also include professionals. Discussed the positives and negatives of these systems and ways to manage them. Discussed use of Thinking/Feeling/Doing triangle in decision making when mood and emotions have an impact on perception. Patient was able to use an example of changing her "close" support system with the transition of a relationship in order to support and encourage other peers.   Paula Little, Paula Little 12/24/2015, 2:10 PM

## 2015-12-24 NOTE — Progress Notes (Signed)
D. Pt pleasant on approach, flat affect but brightens with conversation.  Pt was positive for evening wrap up group, observed interacting appropriately with peers on the unit.  Pt has had suicidal thoughts "off and on" today but does contract for safety on the unit.  A.  Support and encouragement offered, medications given as ordered  R.  Pt remains safe on the unit, will continue to monitor.

## 2015-12-25 MED ORDER — MAGNESIUM CITRATE PO SOLN
1.0000 | Freq: Once | ORAL | Status: AC
  Administered 2015-12-25: 1 via ORAL

## 2015-12-25 NOTE — Progress Notes (Addendum)
D Pa " Paula Little" ( as she prefers to be called )  Is doing well today. She is seen in the dayroom....watching TV this morning. She takes her scheduled meds as ordered and she was given magnesium citrate, for c/o constipation.  A  She completed her daily assessment and on it she wrote she denied SI today  And she rated her depression, hopelessness and anxiety " 6/4/3", respectively. She is talking about how to make plans tomorrow.. when she is discharged she can get [place]. She will follow up with  Social worker Monday AM. She is given magnesium citrate prn for c/o constipation. No results as of yet..   R Safety is in place  And poc cont.

## 2015-12-25 NOTE — Progress Notes (Signed)
Adult Psychoeducational Group Note  Date:  12/25/2015 Time:  8:54 PM  Group Topic/Focus:  Wrap-Up Group:   The focus of this group is to help patients review their daily goal of treatment and discuss progress on daily workbooks.  Participation Level:  Active  Participation Quality:  Appropriate and Attentive  Affect:  Appropriate  Cognitive:  Appropriate  Insight: Appropriate  Engagement in Group:  Engaged  Modes of Intervention:  Discussion  Additional Comments:  Pt stated her goal for today was to stay positive. Pt stated one positive thing for today is that she is still alive and she is supposed to discharge tomorrow.  [name], [name] 12/25/2015, 8:54 PM
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
## 2015-12-25 NOTE — [facility] Group Notes (Signed)
[facility] LCSW Group Therapy Note   12/25/2015  10 AM   Type of Therapy and Topic: Group Therapy: Feelings Around Returning Home & Establishing a Supportive Framework and Discussion of Strength and Vuneralbility  Participation Level: Active   Description of Group:  Patients first processed thoughts and feelings about up coming discharge. These included fears of upcoming changes, lack of change, new living environments, judgements and expectations from others and overall stigma of MH issues. We then discussed what is a supportive framework? What does it look like feel like and how do I discern it from and unhealthy non-supportive network? Learn how to cope when supports are not helpful and don't support you. Discuss what to do when your family/friends are not supportive.   Therapeutic Goals Addressed in Processing Group:  1. Patient will identify one healthy supportive network that they can use at discharge. 2. Patient will identify one factor of a supportive framework and how to tell it from an unhealthy network. 3. Patient able to identify one coping skill to use when they do not have positive supports from others. 4. Patient will demonstrate ability to communicate their needs through discussion and/or role plays.  Summary of Patient Progress:  Pt engaged easily during group session. As patients processed their anxiety about discharge and described healthy supports patient  Became quiet and inattentive. When engaged pt shared sadness around anniversary of brother's death. Patient was hesitant to share any positive memory of brother and only spoke of painful memories. Patient shared that she is best with kids and pets and finds her solace when interacting with children and animals.  Patient chose   Paula Bernatherine C Brittony Billick, LCSW

## 2015-12-25 NOTE — Plan of Care (Signed)
Problem: Diagnosis: Increased Risk For Suicide Attempt Goal: STG-Patient Will Attend All Groups On The Unit Outcome: Progressing Pt attended group     

## 2015-12-25 NOTE — Progress Notes (Signed)
D. Pt pleasant on approach, flat affect but brightens with conversation.  No complaints voiced at this time.  Positive for evening wrap up group, interacting appropriately with peers on the unit.  Denies SI/HI/hallucinations at this time.  A.  Support and encouragement offered  R.  Pt remains safe on the unit, will continue to monitor.

## 2015-12-26 MED ORDER — ZIPRASIDONE HCL 40 MG PO CAPS: 40.0000 mg | ORAL_CAPSULE | Freq: Two times a day (BID) | ORAL | Status: AC

## 2015-12-26 MED ORDER — PHENYTOIN SODIUM EXTENDED 100 MG PO CAPS: 100.0000 mg | ORAL_CAPSULE | Freq: Three times a day (TID) | ORAL | Status: AC

## 2015-12-26 MED ORDER — BENZTROPINE MESYLATE 0.5 MG PO TABS: 0.5000 mg | ORAL_TABLET | Freq: Every day | ORAL | Status: AC

## 2015-12-26 MED ORDER — LAMOTRIGINE 25 MG PO TABS: 25.0000 mg | ORAL_TABLET | Freq: Every day | ORAL | Status: AC

## 2015-12-26 NOTE — Progress Notes (Signed)
Patient ID: Paula Little Paula Little, female   DOB: Sep 08, 1986, 30 y.o.   MRN: [id number]  Pt discharged home with a bus pass and money for the [employer] bus. Pt was stable and appreciative at that time. All papers and prescriptions were given and valuables returned. Verbal understanding expressed. Denies SI/HI and A/VH. Pt given opportunity to express concerns and ask questions.

## 2015-12-26 NOTE — [facility] Suicide Risk Assessment (Signed)
[facility] INPATIENT:  Family/Significant Other Suicide Prevention Education  Suicide Prevention Education:  Patient Refusal for Family/Significant Other Suicide Prevention Education: The patient Paula Little has refused to provide written consent for family/significant other to be provided Family/Significant Other Suicide Prevention Education during admission and/or prior to discharge.  Physician notified. SPE reviewed with patient and brochure provided. Patient encouraged to return to hospital if having suicidal thoughts, patient verbalized his/her understanding and has no further questions at this time.   Paula Little, Paula Little 12/26/2015, 10:23 AM

## 2015-12-26 NOTE — Progress Notes (Signed)
Patient ID: Paula Little, female   DOB: 21-Apr-1986, 30 y.o.   MRN: [id number] PER STATE REGULATIONS 482.30  THIS CHART WAS REVIEWED FOR MEDICAL NECESSITY WITH RESPECT TO THE PATIENT'S ADMISSION/ DURATION OF STAY.  NEXT REVIEW DATE: 12/29/2015. [name] [name] [name], RN, BSN CASE MANAGER

## 2015-12-26 NOTE — Plan of Care (Signed)
Problem: Diagnosis: Increased Risk For Suicide Attempt Goal: LTG-Patient Will Report Improved Mood and Deny Suicidal LTG (by discharge) Patient will report improved mood and deny suicidal ideation.  Outcome: Progressing Pt denied suicidal ideation

## 2015-12-26 NOTE — Tx Team (Signed)
Interdisciplinary Treatment Plan Update (Adult) Date: 12/26/2015   Date: 12/26/2015 12:59 PM  Progress in Treatment:  Attending groups: Yes Participating in groups: Yes Taking medication as prescribed: Yes  Tolerating medication: Yes  Family/Significant othe contact made: No,  Pt declines Patient understands diagnosis: Yes AEB seeking help with depression Discussing patient identified problems/goals with staff: Yes  Medical problems stabilized or resolved: Yes  Denies suicidal/homicidal ideation:Yes Patient has not harmed self or Others: Yes   New problem(s) identified: None identified at this time.   Discharge Plan or Barriers: Pt will return home and follow-up with [facility] in [place].  Additional comments:  Patient and CSW reviewed pt's identified goals and treatment plan. Patient verbalized understanding and agreed to treatment plan. CSW reviewed [facility] "Discharge Process and Patient Involvement" Form. Pt verbalized understanding of information provided and signed form.   Reason for Continuation of Hospitalization:  Depression Medical Issues Medication stabilization  Estimated length of stay: 0 days  Review of initial/current patient goals per problem list:   1.  Goal(s): Patient will participate in aftercare plan  Met:  Yes  Target date: 3-5 days from date of admission   As evidenced by: Patient will participate within aftercare plan AEB aftercare provider and housing plan at discharge being identified.   12/22/15: Pt will return home and follow-up with [facility] in [place].  2.  Goal (s): Patient will exhibit decreased depressive symptoms and suicidal ideations.  Met:   Yes  Target date: 3-5 days from date of admission   As evidenced by: Patient will utilize self rating of depression at 3 or below and demonstrate decreased signs of depression or be deemed stable for discharge by MD. 12/22/15: Pt was admitted with symptoms of depression, rating 10/10. Pt continues to  present with flat affect and depressive symptoms.  Pt will demonstrate decreased symptoms of depression and rate depression at 3/10 or lower prior to discharge. 12/26/2015: Pt rates depression at 2/10; denies SI  Attendees:  Patient:    Family:    Physician: Dr. Parke Poisson, MD  12/26/2015 12:59 PM  Nursing: Lars Pinks, RN Case manager  12/26/2015 12:59 PM  Clinical Social Worker Peri Maris, [place] 12/26/2015 12:59 PM  Other: Tilden Fossa, [place] 12/26/2015 12:59 PM  Clinical: Marcella Dubs, RN; Gaylan Gerold, RN 12/26/2015 12:59 PM  Other: , RN Charge Nurse 12/26/2015 12:59 PM  Other:     Peri Maris, [place] Social Work [phone]

## 2015-12-26 NOTE — Progress Notes (Signed)
Patient ID: Paula Little, female   DOB: [birth year], 30 y.o.   MRN: [id number]  Pt currently presents with a blunted affect and anxious behavior. Per self inventory, pt rates depression, hopelessness and anxiety at a 0. Pt's daily goal is to "stay positive" and they intend to do so by "not let negative thoughts get in my head." Pt reports good sleep, a good appetite, normal energy and good concentration.   Pt provided with medications per providers orders. Pt's labs and vitals were monitored throughout the day. Pt supported emotionally and encouraged to express concerns and questions. Pt educated on medications and importance of medication adherence.   Pt's safety ensured with 15 minute and environmental checks. Pt currently denies SI/HI and A/V hallucinations. Pt verbally agrees to seek staff if SI/HI or A/VH occurs and to consult with staff before acting on these thoughts. Will continue POC.

## 2015-12-26 NOTE — Progress Notes (Signed)
  [facility] :  Will you be returning to the same living situation after discharge:  Yes,  pt returning home At discharge, do you have transportation home?: Yes,  Pt provided with bus pass and [employer] fare Do you have the ability to pay for your medications: Yes,  Pt provided prescriptions  Release of information consent forms completed and in the chart;  Patient's signature needed at discharge.  Patient to Follow up at: Follow-up Information    [facility] On [facility]/2015.   Why:  at 8:00am for your hospital discharge appointment.   Contact information:   [address] [place] [address] Phone: [phone] Fax: [phone]      Next level of care provider has access to [facility] Link:no  Safety Planning and Suicide Prevention discussed: Yes,  with Pt; declined family contact  Have you used any form of tobacco in the last 30 days? (Cigarettes, Smokeless Tobacco, Cigars, and/or Pipes): Yes  Has patient been referred to the Quitline?: Patient refused referral  Patient has been referred for addiction treatment: Yes  Elaina HoopsCarter, Caterine Mcmeans M 12/26/2015, 1:00 PM

## 2015-12-26 NOTE — Discharge Summary (Signed)
Physician Discharge Summary Note  Patient:  Paula Little is an 30 y.o., female MRN:  [id number] DOB:  [birth year] Patient phone:  [phone] (home)  Patient address:   [address] [place] [address],   Total Time spent with patient: 45 minutes  Date of Admission:  12/21/2015 Date of Discharge: 12/26/2015 Reason for Admission:PER HPI- 30 year old female, well known to our unit from prior psychiatric medications . States that her grandmother recently passed away, after which she impulsively overdosed . She overdosed on Trileptal - states " I don't know how many I took, a handful ". States that her intention at the time was suicidal. Patient states she has been feeling depressed even prior to her grandmother's unexpected death, but worsened after this. States she has been generally compliant with her prescribed medications, except for Risperidone, which she states causes her to be more sensitive to sun/prone to sunburns . As noted above, has had multiple prior admissions, most recently 11/04/15- 11/10/15 for depression and suicidality. She has been diagnosed with Schizoaffective Disorder and with PTSD   Principal Problem: Schizoaffective disorder, bipolar type [facility]) Discharge Diagnoses: Patient Active Problem List   Diagnosis Date Noted  . MDD (major depressive disorder), recurrent episode, moderate (HCC) [F33.1] 11/16/2015  . Schizoaffective disorder, bipolar type (HCC) [F25.0]   . GAD (generalized anxiety disorder) [F41.1]   . Schizoaffective disorder, depressive type (HCC) [F25.1] 11/07/2015  . Post traumatic stress disorder (PTSD) [F43.10] 09/22/2015  . Seizure disorder (HCC) [G40.909]   . Intentional overdose of drug in tablet form (HCC) [T50.902A] 08/06/2015  . Overdose [T50.901A] 08/06/2015  . Cervicitis [N72] 03/31/2013  . Chronic constipation [K59.00] 11/12/2012  . Chronic lower back pain [M54.5, G89.29] 11/12/2012  . Hot flashes [N95.1] 11/12/2012  . Kidney stones  [N20.0]   . Anemia [D64.9]     Past Psychiatric History: See Above  Past Medical History:  Past Medical History  Diagnosis Date  . Kidney stones   . Anemia   . Chronic abdominal pain   . Depression   . Anxiety   . Bipolar disorder (HCC)   . Seizures (HCC)     "havent had one in a while"  . PTSD (post-traumatic stress disorder)     Past Surgical History  Procedure Laterality Date  . Cholecystectomy    . Eye surgery     Family History:  Family History  Problem Relation Age of Onset  . Depression Mother   . Anxiety disorder Mother   . Bipolar disorder Mother    Family Psychiatric  History: See Above Social History:  History  Alcohol Use  . Yes    Comment: 2 drinks every 3 months. Denies hx of DUI/DWI, detox and rehab     History  Drug Use  . Yes  . Special: "Crack" cocaine    Comment: last use in 2002- pt quit on her own    Social History   Social History  . Marital Status: Married    Spouse Name: N/A  . Number of Children: N/A  . Years of Education: N/A   Social History Main Topics  . Smoking status: Current Every Day Smoker -- 1.00 packs/day for 0 years    Types: Cigarettes  . Smokeless tobacco: Never Used  . Alcohol Use: Yes     Comment: 2 drinks every 3 months. Denies hx of DUI/DWI, detox and rehab  . Drug Use: Yes    Special: "Crack" cocaine     Comment: last use  in 2002- pt quit on her own  . Sexual Activity: Yes    Birth Control/ Protection: None   Other Topics Concern  . None   Social History Narrative   Pt lives in [place] with fiance and his cousin. NO kids. Pt has a total of 8 half siblings. Pt is on disability and is unemployed. Pt completed 9th grade.     Hospital Course: Paula Little was admitted for Schizoaffective disorder, bipolar type [facility]) and crisis management.  Pt was treated discharged with the medications listed below under Medication List.  Medical problems were identified and treated as needed.  Home medications  were restarted as appropriate.  Improvement was monitored by observation and Paula Little 's daily report of symptom reduction.  Emotional and mental status was monitored by daily self-inventory reports completed by Paula Little and clinical staff.         Paula Little was evaluated by the treatment team for stability and plans for continued recovery upon discharge. Paula Little 's motivation was an integral factor for scheduling further treatment. Employment, transportation, bed availability, health status, family support, and any pending legal issues were also considered during hospital stay. Pt was offered further treatment options upon discharge including but not limited to Residential, Intensive Outpatient, and Outpatient treatment.  Paula Little will follow up with the services as listed below under Follow Up Information.     Upon completion of this admission the patient was both mentally and medically stable for discharge denying suicidal/homicidal ideation, auditory/visual/tactile hallucinations, delusional thoughts and paranoia.    Paula Little responded well to treatment with Cogentin, Lamictal, Geodon and  Dilatin  Pt demonstrated improvement without reported or observed adverse effects to the point of stability appropriate for outpatient management. Pertinent labs include:  Prolactin 91.2 (high), for which outpatient follow-up is necessary for lab recheck as mentioned below. Reviewed CBC, CMP, BAL, and UDS; all unremarkable aside from noted exceptions.   Physical Findings: AIMS: Facial and Oral Movements Muscles of Facial Expression: None, normal Lips and Perioral Area: None, normal Jaw: None, normal Tongue: None, normal,Extremity Movements Upper (arms, wrists, hands, fingers): None, normal Lower (legs, knees, ankles, toes): None, normal, Trunk Movements Neck, shoulders, hips: None, normal, Overall Severity Severity of abnormal movements  (highest score from questions above): None, normal Incapacitation due to abnormal movements: None, normal Patient's awareness of abnormal movements (rate only patient's report): No Awareness, Dental Status Current problems with teeth and/or dentures?: No Does patient usually wear dentures?: No  CIWA:    COWS:     Musculoskeletal: Strength & Muscle Tone: within normal limits Gait & Station: normal Patient leans: N/A  Psychiatric Specialty Exam:m See SRA BY MD Review of Systems  Psychiatric/Behavioral: Negative for suicidal ideas. Depression: stable. The patient is not nervous/anxious.   All other systems reviewed and are negative.   Blood pressure 106/64, pulse 86, temperature 98.4 F (36.9 C), temperature source Oral, resp. rate 18, height  (1.549 m), weight 94.802 kg (209 lb), last menstrual period 12/20/2015, SpO2 100 %.Body mass index is 39.51 kg/(m^2).  Have you used any form of tobacco in the last 30 days? (Cigarettes, Smokeless Tobacco, Cigars, and/or Pipes): Yes  Has this patient used any form of tobacco in the last 30 days? (Cigarettes, Smokeless Tobacco, Cigars, and/or Pipes), No  Blood Alcohol level:  Lab Results  Component Value Date   [facility] <5 12/20/2015   ETH <5 11/15/2015    Metabolic Disorder  Labs:  Lab Results  Component Value Date   HGBA1C 5.3 12/23/2015   MPG 105 12/23/2015   MPG 105 08/13/2015   Lab Results  Component Value Date   PROLACTIN 91.2* 12/23/2015   Lab Results  Component Value Date   CHOL 188 12/23/2015   TRIG 147 12/23/2015   HDL 61 12/23/2015   CHOLHDL 3.1 12/23/2015   VLDL 29 12/23/2015   LDLCALC 98 12/23/2015   LDLCALC 150* 08/13/2015    See Psychiatric Specialty Exam and Suicide Risk Assessment completed by Attending Physician prior to discharge.  Discharge destination:  Home  Is patient on multiple antipsychotic therapies at discharge:  No   Has Patient had three or more failed trials of antipsychotic monotherapy by  history:  No  Recommended Plan for Multiple Antipsychotic Therapies: NA  Discharge Instructions    Activity as tolerated - No restrictions    Complete by:  As directed      Diet general    Complete by:  As directed      Discharge instructions    Complete by:  As directed   Take all medications as prescribed. Keep all follow-up appointments as scheduled.  Do not consume alcohol or use illegal drugs while on prescription medications. Report any adverse effects from your medications to your primary care provider promptly.  In the event of recurrent symptoms or worsening symptoms, call 911, a crisis hotline, or go to the nearest emergency department for evaluation.            Medication List    STOP taking these medications        hydrOXYzine 25 MG tablet  Commonly known as:  ATARAX/VISTARIL     Oxcarbazepine 300 MG tablet  Commonly known as:  TRILEPTAL     promethazine 25 MG tablet  Commonly known as:  PHENERGAN     ranitidine 75 MG tablet  Commonly known as:  ZANTAC     risperiDONE 1 MG tablet  Commonly known as:  RISPERDAL     sucralfate 1 g tablet  Commonly known as:  CARAFATE      TAKE these medications      Indication   benztropine 0.5 MG tablet  Commonly known as:  COGENTIN  Take 1 tablet (0.5 mg total) by mouth daily.   Indication:  Extrapyramidal Reaction caused by Medications     lamoTRIgine 25 MG tablet  Commonly known as:  LAMICTAL  Take 1 tablet (25 mg total) by mouth daily.   Indication:  mood stabilization     phenytoin 100 MG ER capsule  Commonly known as:  DILANTIN  Take 1 capsule (100 mg total) by mouth 3 (three) times daily.   Indication:  Seizure     ziprasidone 40 MG capsule  Commonly known as:  GEODON  Take 1 capsule (40 mg total) by mouth 2 (two) times daily with a meal.   Indication:  Manic-Depression, Major Depressive Disorder     zolpidem 5 MG tablet  Commonly known as:  AMBIEN  Take 5 mg by mouth at bedtime as needed for  sleep.            Follow-up Information    [facility] On 12/28/2015.   Why:  at 8:00am for your hospital discharge appointment.   Contact information:   [address] [place], [place] [address] Phone: [phone](336) [phone] Fax: [phone](336) [phone]      Follow-up recommendations:  Activity:  as tolerated Diet:  heart healthy  Comments:  Take all medications as prescribed. Keep all follow-up appointments as scheduled.  Do not consume alcohol or use illegal drugs while on prescription medications. Report any adverse effects from your medications to your primary care provider promptly.  In the event of recurrent symptoms or worsening symptoms, call 911, a crisis hotline, or go to the nearest emergency department for evaluation.   Signed: Oneta Rack, NP 12/26/2015, 10:22 AM   Patient seen, Suicide Assessment Completed.  Disposition Plan Reviewed

## 2015-12-26 NOTE — [facility] Suicide Risk Assessment (Addendum)
[facility] Discharge Suicide Risk Assessment   Principal Problem: Schizoaffective disorder, bipolar type [facility]) Discharge Diagnoses:  Patient Active Problem List   Diagnosis Date Noted  . MDD (major depressive disorder), recurrent episode, moderate (HCC) [F33.1] 11/16/2015  . Schizoaffective disorder, bipolar type (HCC) [F25.0]   . GAD (generalized anxiety disorder) [F41.1]   . Schizoaffective disorder, depressive type (HCC) [F25.1] 11/07/2015  . Post traumatic stress disorder (PTSD) [F43.10] 09/22/2015  . Seizure disorder (HCC) [G40.909]   . Intentional overdose of drug in tablet form (HCC) [T50.902A] 08/06/2015  . Overdose [T50.901A] 08/06/2015  . Cervicitis [N72] 03/31/2013  . Chronic constipation [K59.00] 11/12/2012  . Chronic lower back pain [M54.5, G89.29] 11/12/2012  . Hot flashes [N95.1] 11/12/2012  . Kidney stones [N20.0]   . Anemia [D64.9]     Total Time spent with patient: 30 minutes  Musculoskeletal: Strength & Muscle Tone: within normal limits Gait & Station: normal Patient leans: N/A  Psychiatric Specialty Exam: ROS denies headache, denies chest pain, denies shortness of breath, no vomiting, no rash   Blood pressure 106/64, pulse 86, temperature 98.4 F (36.9 C), temperature source Oral, resp. rate 18, height  (1.549 m), weight 209 lb (94.802 kg), last menstrual period 12/20/2015, SpO2 100 %.Body mass index is 39.51 kg/(m^2).  General Appearance: improved grooming   Eye Contact::  Good  Speech:  Normal Rate409  Volume:  Normal  Mood:  improved, and currently denies feeling particularly depressed, states " I am having a good day"  Affect:  Appropriate and more reactive   Thought Process:  Linear  Orientation:  Full (Time, Place, and Person)  Thought Content:  no hallucinations, no delusions, not internally preoccupied   Suicidal Thoughts:  No- denies any suicidal ideations, denies any self injurious ideations, denies any violent or homicidal ideations   Homicidal  Thoughts:  No  Memory:  recent and remote grossly intact   Judgement:  Other:  improving   Insight:  Fair  Psychomotor Activity:  Normal  Concentration:  Good  Recall:  Good  Fund of Knowledge:Good  Language: Good  Akathisia:  Negative  Handed:  Right  AIMS (if indicated):     Assets:  Communication Skills Desire for Improvement Resilience  Sleep:  Number of Hours: 6.25  Cognition: WNL  ADL's:  Intact   Mental Status Per Nursing Assessment::   On Admission:  NA  Demographic Factors:  30 year old female, lives with SO, on disability   Loss Factors: Recent death of grandmother , disability   Historical Factors: History of depression, history of prior psychiatric medications , has been diagnosed with Schizoaffective Disorder, and with PTSD in the past   Risk Reduction Factors:   Living with another person, especially a relative and Positive social support- states her SO is very supportive   Continued Clinical Symptoms:  At this time patient is improved compared to admission - mood improved, affect more reactive, smiles often, appropriately, no thought disorder, no suicidal ideations, no homicidal ideations, no psychotic symptoms at this time, future oriented, 0x3. Currently tolerating medications well . Of note, has not had any seizure episodes on unit, and denies side effects on Dilantin.  Cognitive Features That Contribute To Risk:  No gross cognitive deficits noted upon discharge. Is alert , attentive, and oriented x 3   Suicide Risk:  Mild:  Suicidal ideation of limited frequency, intensity, duration, and specificity.  There are no identifiable plans, no associated intent, mild dysphoria and related symptoms, good self-control (both objective and  subjective assessment), few other risk factors, and identifiable protective factors, including available and accessible social support.  Follow-up Information    Follow [facility] with [facility] On 12/28/2015.   Why:   at 8:00am for your hospital discharge appointment.   Contact information:   [address] [place], [place] [address] Phone: [phone](336) [phone] Fax: [phone]      Plan Of Care/Follow-up recommendations:  Activity:  as tolerated  Diet:  Regular Tests:  NA Other:  see below  Patient is leaving unit in good spirits  Plans to return home Plans to follow up as above  Has established PCP for medical issues as needed- Dr. Alonna Min[place]rea Gist, [place] . Saumya Hukill, MD 12/26/2015, 12:07 PM

## 2016-02-13 IMAGING — DX DG CHEST 2V
2 series · 2 of 2 positions shown · non-contrast
Comparison: 06/06/2015 and prior chest radiographs dating back to
03/18/2010

CLINICAL DATA: 29-year-old female with acute chest pain for 3 days.

EXAM:
CHEST  2 VIEW

[chest pa]
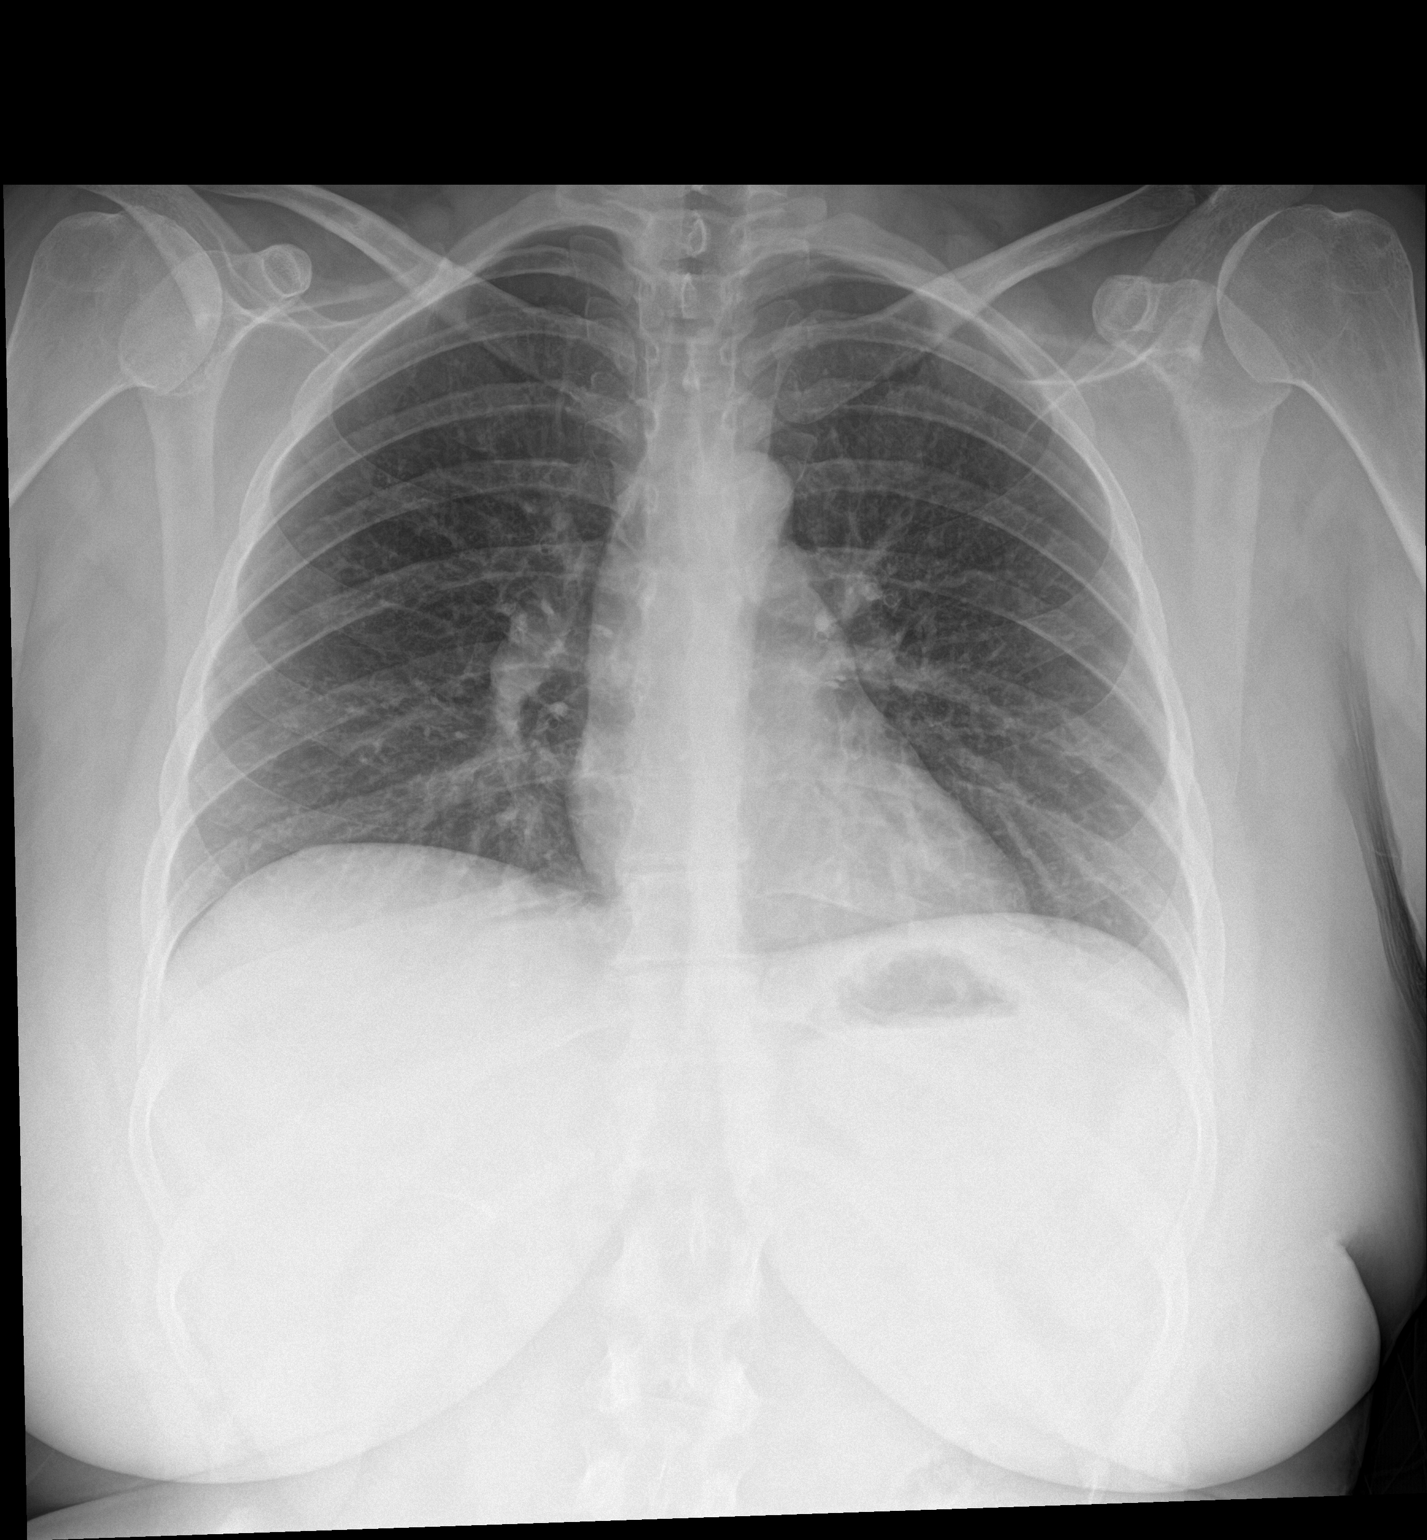

[chest lat]
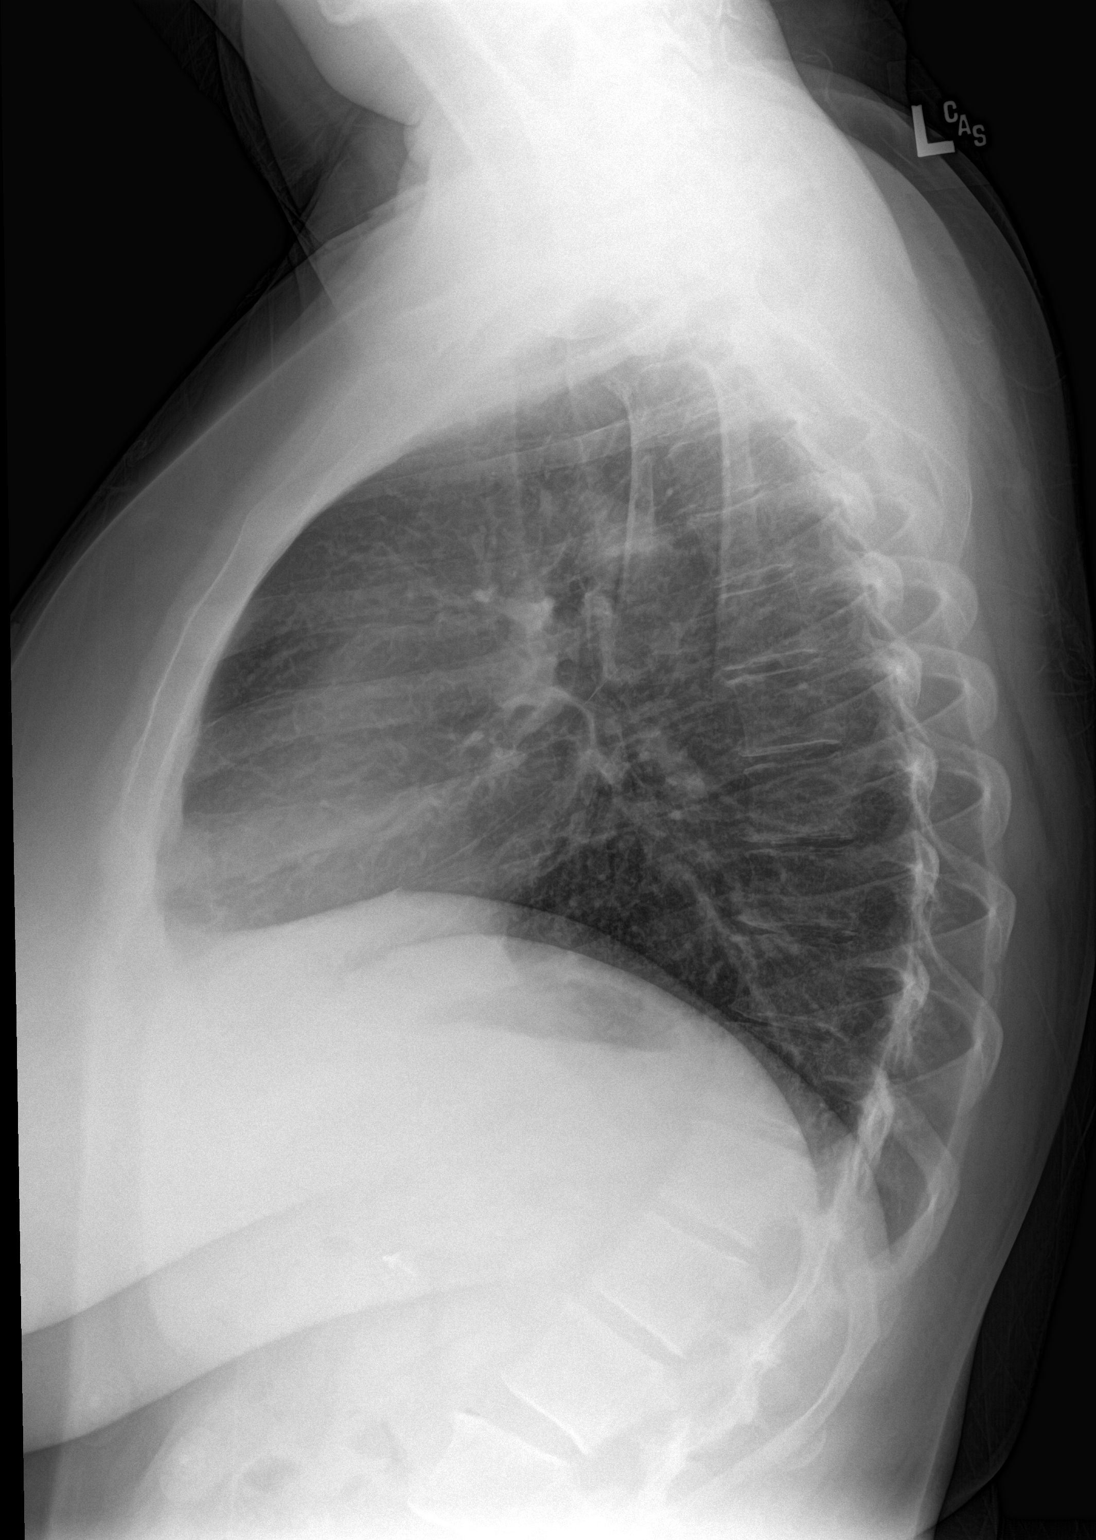

[2 of 2 positions shown; findings below may reference images not displayed]

FINDINGS: The cardiomediastinal silhouette is unremarkable.

There is no evidence of focal airspace disease, pulmonary edema,
suspicious pulmonary nodule/mass, pleural effusion, or pneumothorax.
No acute bony abnormalities are identified.
IMPRESSION: No active cardiopulmonary disease.

## 2016-03-14 IMAGING — CR DG CHEST 2V
2 series · 2 of 2 positions shown · non-contrast
Comparison: 07/06/2015

CLINICAL DATA: Nausea, vomiting, and mid to left-sided angina for 5
days. Unable to keep food or fluids down for 5 days. Weakness.
Smoker.

EXAM:
CHEST  2 VIEW

[w chest pa]
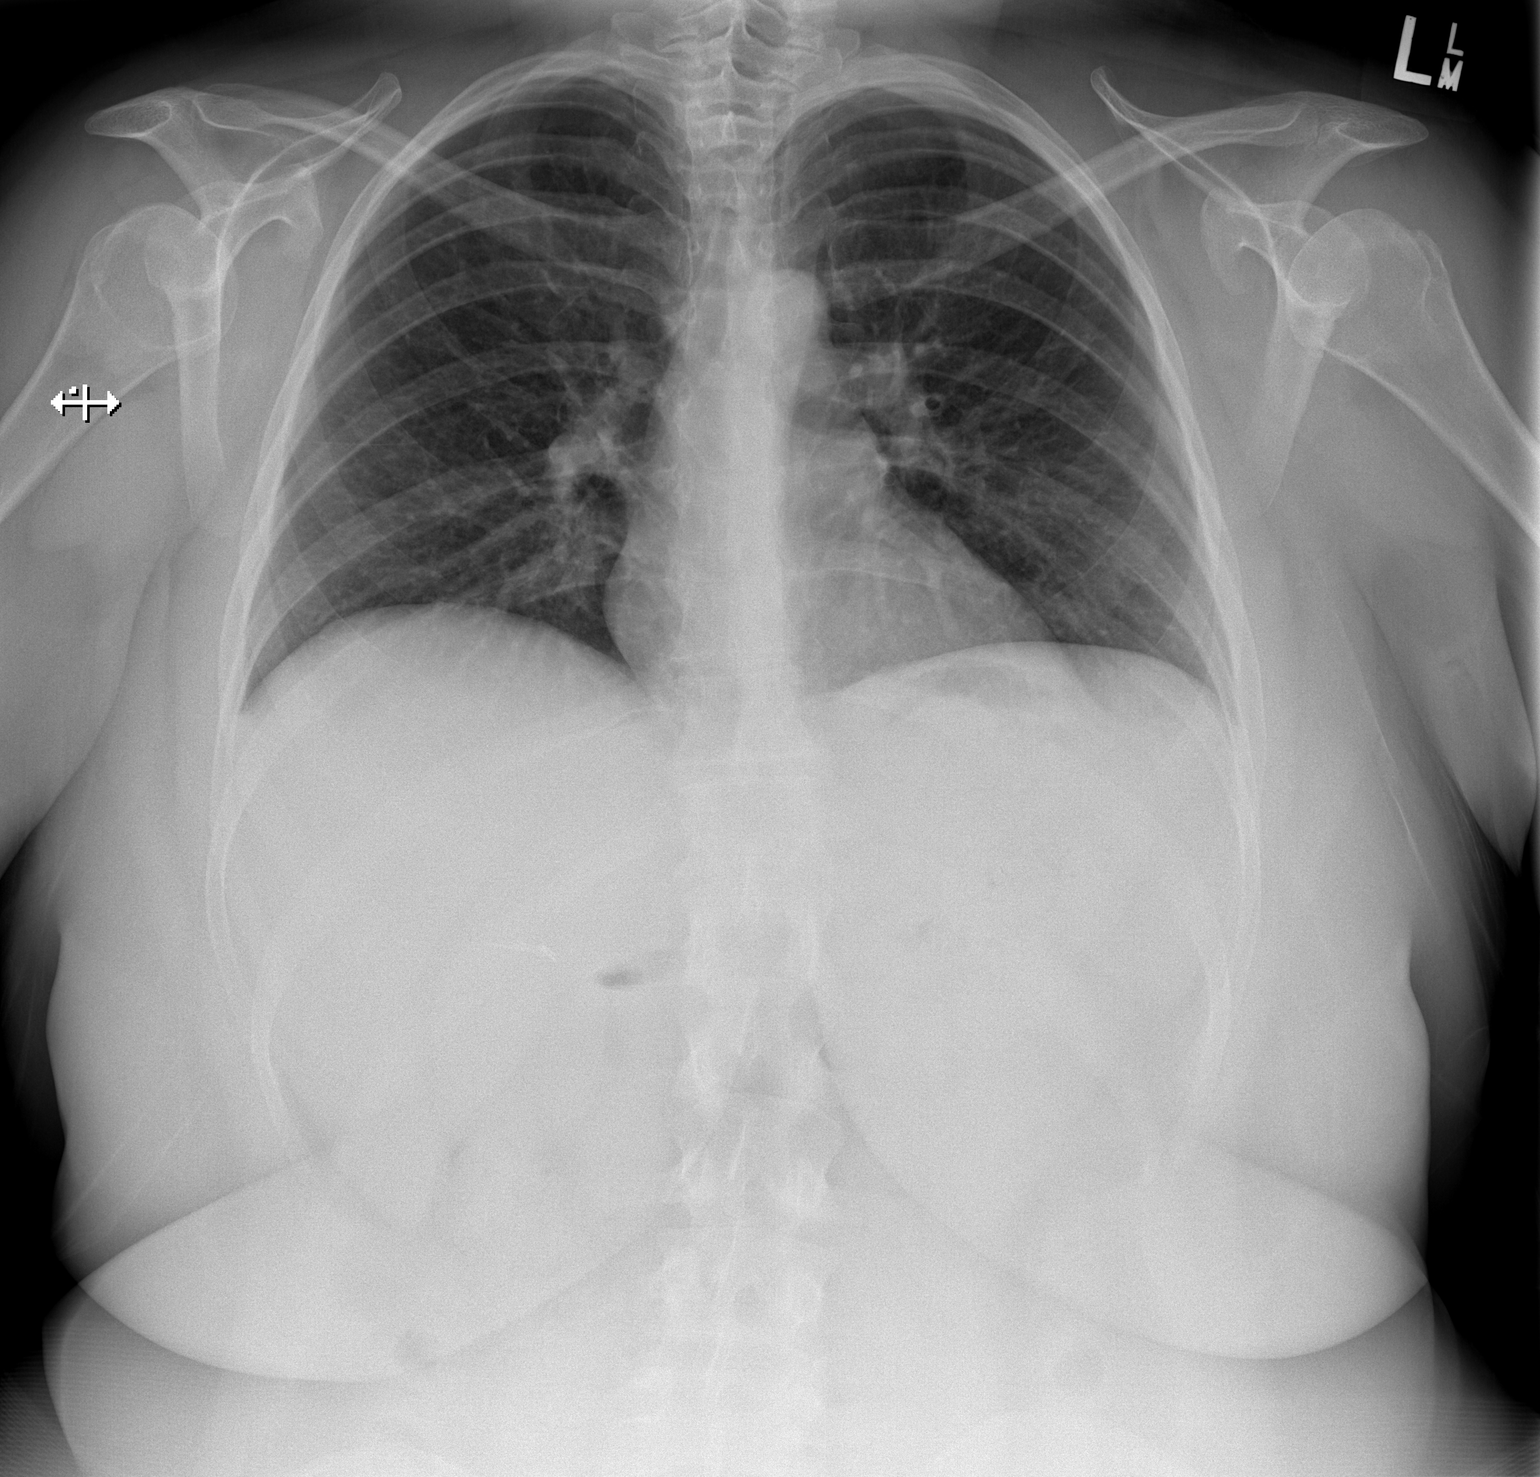

[w chest lat]
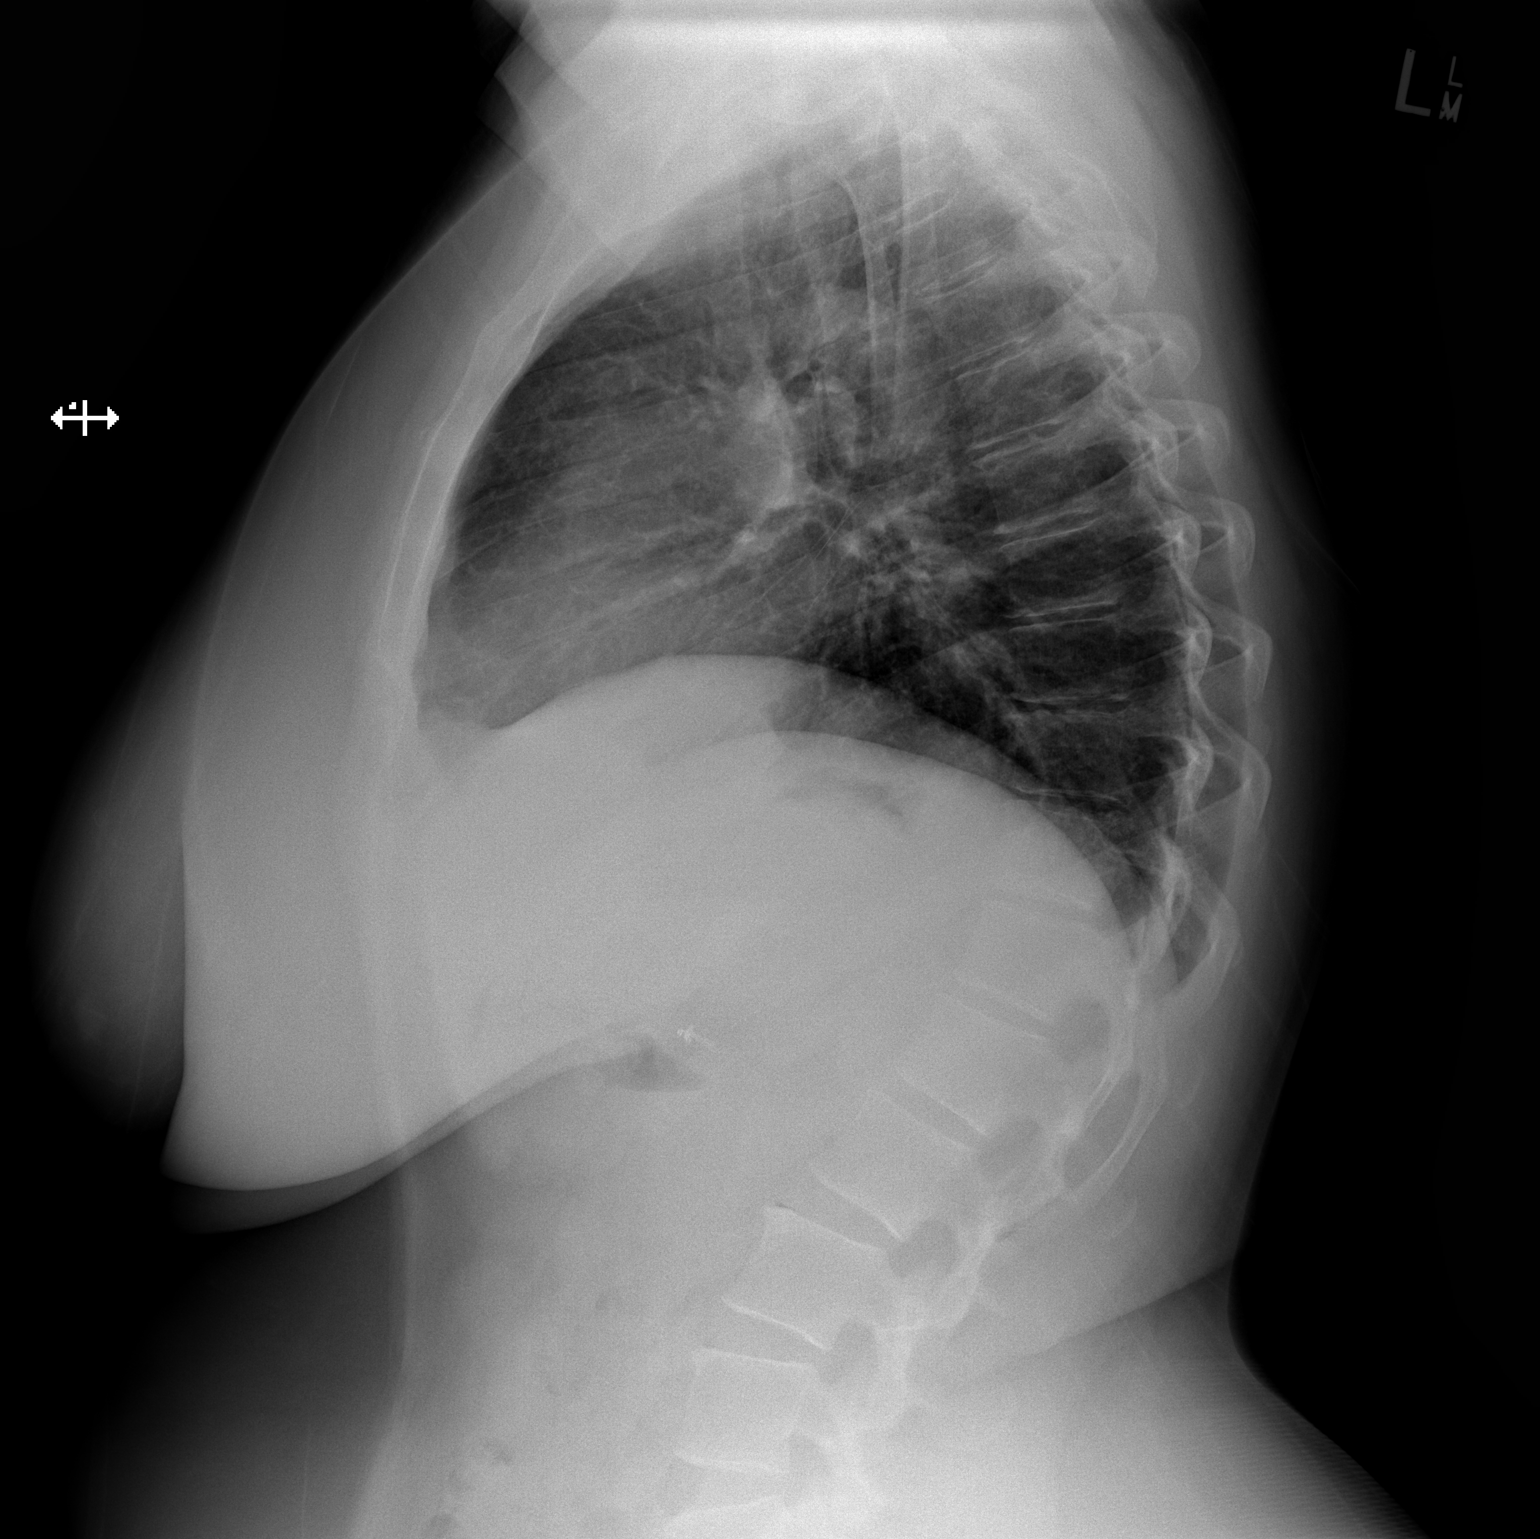

[2 of 2 positions shown; findings below may reference images not displayed]

FINDINGS: Shallow inspiration. Normal heart size and pulmonary vascularity. No
focal airspace disease or consolidation in the lungs. No blunting of
costophrenic angles. No pneumothorax. Mediastinal contours appear
intact. Surgical clips in the right upper quadrant. Mild
degenerative changes in the spine.
IMPRESSION: No active cardiopulmonary disease.
